# Patient Record
Sex: Female | Born: 1935
Health system: Southern US, Community
[De-identification: ages and names within clinical notes are randomized; demographics above are authoritative.]

## PROBLEM LIST (undated history)

## (undated) DIAGNOSIS — I639 Cerebral infarction, unspecified: Secondary | ICD-10-CM

## (undated) DIAGNOSIS — N289 Disorder of kidney and ureter, unspecified: Secondary | ICD-10-CM

## (undated) DIAGNOSIS — Z889 Allergy status to unspecified drugs, medicaments and biological substances status: Secondary | ICD-10-CM

## (undated) DIAGNOSIS — R011 Cardiac murmur, unspecified: Secondary | ICD-10-CM

## (undated) DIAGNOSIS — J40 Bronchitis, not specified as acute or chronic: Secondary | ICD-10-CM

## (undated) DIAGNOSIS — K219 Gastro-esophageal reflux disease without esophagitis: Secondary | ICD-10-CM

## (undated) DIAGNOSIS — Z8719 Personal history of other diseases of the digestive system: Secondary | ICD-10-CM

## (undated) DIAGNOSIS — E785 Hyperlipidemia, unspecified: Secondary | ICD-10-CM

## (undated) DIAGNOSIS — J189 Pneumonia, unspecified organism: Secondary | ICD-10-CM

## (undated) DIAGNOSIS — B029 Zoster without complications: Secondary | ICD-10-CM

## (undated) DIAGNOSIS — M810 Age-related osteoporosis without current pathological fracture: Secondary | ICD-10-CM

## (undated) DIAGNOSIS — I10 Essential (primary) hypertension: Secondary | ICD-10-CM

## (undated) DIAGNOSIS — E119 Type 2 diabetes mellitus without complications: Secondary | ICD-10-CM

## (undated) HISTORY — PX: OTHER SURGICAL HISTORY: SHX169

## (undated) HISTORY — DX: Age-related osteoporosis without current pathological fracture: M81.0

## (undated) HISTORY — DX: Hyperlipidemia, unspecified: E78.5

## (undated) HISTORY — PX: ABDOMINAL HYSTERECTOMY: SHX81

## (undated) HISTORY — PX: CATARACT EXTRACTION: SUR2

## (undated) HISTORY — PX: TONSILLECTOMY: SUR1361

---

## 1999-08-07 ENCOUNTER — Other Ambulatory Visit: Admission: RE | Admit: 1999-08-07 | Discharge: 1999-08-07 | Payer: Self-pay | Admitting: Family Medicine

## 2000-09-11 ENCOUNTER — Encounter: Payer: Self-pay | Admitting: Family Medicine

## 2000-09-11 ENCOUNTER — Encounter: Admission: RE | Admit: 2000-09-11 | Discharge: 2000-09-11 | Payer: Self-pay | Admitting: Family Medicine

## 2001-01-13 ENCOUNTER — Ambulatory Visit (HOSPITAL_COMMUNITY): Admission: RE | Admit: 2001-01-13 | Discharge: 2001-01-13 | Payer: Self-pay | Admitting: Family Medicine

## 2004-05-03 ENCOUNTER — Encounter: Admission: RE | Admit: 2004-05-03 | Discharge: 2004-05-03 | Payer: Self-pay | Admitting: Family Medicine

## 2014-03-17 ENCOUNTER — Encounter (HOSPITAL_COMMUNITY): Payer: Self-pay | Admitting: Emergency Medicine

## 2014-03-17 ENCOUNTER — Emergency Department (HOSPITAL_COMMUNITY): Payer: Medicare Other

## 2014-03-17 ENCOUNTER — Inpatient Hospital Stay (HOSPITAL_COMMUNITY)
Admission: EM | Admit: 2014-03-17 | Discharge: 2014-03-18 | DRG: 066 | Disposition: A | Discharge status: Home / Self Care | Payer: Medicare Other | Attending: Internal Medicine | Admitting: Internal Medicine

## 2014-03-17 ENCOUNTER — Inpatient Hospital Stay (HOSPITAL_COMMUNITY): Payer: Medicare Other

## 2014-03-17 DIAGNOSIS — I10 Essential (primary) hypertension: Secondary | ICD-10-CM | POA: Diagnosis present

## 2014-03-17 DIAGNOSIS — Z9119 Patient's noncompliance with other medical treatment and regimen: Secondary | ICD-10-CM

## 2014-03-17 DIAGNOSIS — E785 Hyperlipidemia, unspecified: Secondary | ICD-10-CM | POA: Diagnosis present

## 2014-03-17 DIAGNOSIS — E1159 Type 2 diabetes mellitus with other circulatory complications: Secondary | ICD-10-CM | POA: Diagnosis present

## 2014-03-17 DIAGNOSIS — I152 Hypertension secondary to endocrine disorders: Secondary | ICD-10-CM | POA: Diagnosis present

## 2014-03-17 DIAGNOSIS — I6350 Cerebral infarction due to unspecified occlusion or stenosis of unspecified cerebral artery: Principal | ICD-10-CM | POA: Diagnosis present

## 2014-03-17 DIAGNOSIS — I639 Cerebral infarction, unspecified: Secondary | ICD-10-CM | POA: Diagnosis present

## 2014-03-17 DIAGNOSIS — R209 Unspecified disturbances of skin sensation: Secondary | ICD-10-CM

## 2014-03-17 DIAGNOSIS — Z8673 Personal history of transient ischemic attack (TIA), and cerebral infarction without residual deficits: Secondary | ICD-10-CM

## 2014-03-17 DIAGNOSIS — E119 Type 2 diabetes mellitus without complications: Secondary | ICD-10-CM | POA: Diagnosis present

## 2014-03-17 DIAGNOSIS — R5381 Other malaise: Secondary | ICD-10-CM

## 2014-03-17 DIAGNOSIS — R5383 Other fatigue: Secondary | ICD-10-CM

## 2014-03-17 DIAGNOSIS — Z79899 Other long term (current) drug therapy: Secondary | ICD-10-CM

## 2014-03-17 DIAGNOSIS — Z91041 Radiographic dye allergy status: Secondary | ICD-10-CM

## 2014-03-17 DIAGNOSIS — Z91199 Patient's noncompliance with other medical treatment and regimen due to unspecified reason: Secondary | ICD-10-CM

## 2014-03-17 DIAGNOSIS — R4789 Other speech disturbances: Secondary | ICD-10-CM | POA: Diagnosis present

## 2014-03-17 HISTORY — DX: Essential (primary) hypertension: I10

## 2014-03-17 HISTORY — DX: Type 2 diabetes mellitus without complications: E11.9

## 2014-03-17 HISTORY — DX: Disorder of kidney and ureter, unspecified: N28.9

## 2014-03-17 HISTORY — DX: Cerebral infarction, unspecified: I63.9

## 2014-03-17 LAB — DIFFERENTIAL
Basophils Absolute: 0 10*3/uL (ref 0.0–0.1)
Basophils Relative: 0 % (ref 0–1)
EOS PCT: 4 % (ref 0–5)
Eosinophils Absolute: 0.3 10*3/uL (ref 0.0–0.7)
Lymphocytes Relative: 17 % (ref 12–46)
Lymphs Abs: 1.2 10*3/uL (ref 0.7–4.0)
MONOS PCT: 7 % (ref 3–12)
Monocytes Absolute: 0.5 10*3/uL (ref 0.1–1.0)
Neutro Abs: 5 10*3/uL (ref 1.7–7.7)
Neutrophils Relative %: 72 % (ref 43–77)

## 2014-03-17 LAB — I-STAT CHEM 8, ED
BUN: 19 mg/dL (ref 6–23)
CALCIUM ION: 1.13 mmol/L (ref 1.13–1.30)
CHLORIDE: 104 meq/L (ref 96–112)
Creatinine, Ser: 1 mg/dL (ref 0.50–1.10)
Glucose, Bld: 112 mg/dL — ABNORMAL HIGH (ref 70–99)
HEMATOCRIT: 42 % (ref 36.0–46.0)
Hemoglobin: 14.3 g/dL (ref 12.0–15.0)
Potassium: 4 mEq/L (ref 3.7–5.3)
Sodium: 139 mEq/L (ref 137–147)
TCO2: 22 mmol/L (ref 0–100)

## 2014-03-17 LAB — COMPREHENSIVE METABOLIC PANEL
ALBUMIN: 3.7 g/dL (ref 3.5–5.2)
ALT: 9 U/L (ref 0–35)
AST: 12 U/L (ref 0–37)
Alkaline Phosphatase: 96 U/L (ref 39–117)
BUN: 19 mg/dL (ref 6–23)
CALCIUM: 9.8 mg/dL (ref 8.4–10.5)
CHLORIDE: 100 meq/L (ref 96–112)
CO2: 26 mEq/L (ref 19–32)
CREATININE: 1.04 mg/dL (ref 0.50–1.10)
GFR calc Af Amer: 59 mL/min — ABNORMAL LOW (ref >90)
GFR calc non Af Amer: 51 mL/min — ABNORMAL LOW (ref >90)
Glucose, Bld: 110 mg/dL — ABNORMAL HIGH (ref 70–99)
Potassium: 4.4 mEq/L (ref 3.7–5.3)
Sodium: 142 mEq/L (ref 137–147)
Total Protein: 7.6 g/dL (ref 6.0–8.3)

## 2014-03-17 LAB — CBC
HCT: 39.6 % (ref 36.0–46.0)
HEMOGLOBIN: 12.8 g/dL (ref 12.0–15.0)
MCH: 28.4 pg (ref 26.0–34.0)
MCHC: 32.3 g/dL (ref 30.0–36.0)
MCV: 87.8 fL (ref 78.0–100.0)
Platelets: 280 10*3/uL (ref 150–400)
RBC: 4.51 MIL/uL (ref 3.87–5.11)
RDW: 13.3 % (ref 11.5–15.5)
WBC: 7 10*3/uL (ref 4.0–10.5)

## 2014-03-17 LAB — ETHANOL: Alcohol, Ethyl (B): <11 mg/dL (ref 0–11)

## 2014-03-17 LAB — URINALYSIS, ROUTINE W REFLEX MICROSCOPIC
BILIRUBIN URINE: NEGATIVE
Glucose, UA: NEGATIVE mg/dL
HGB URINE DIPSTICK: NEGATIVE
Ketones, ur: NEGATIVE mg/dL
Nitrite: NEGATIVE
Protein, ur: NEGATIVE mg/dL
SPECIFIC GRAVITY, URINE: 1.008 (ref 1.005–1.030)
UROBILINOGEN UA: 0.2 mg/dL (ref 0.0–1.0)
pH: 6 (ref 5.0–8.0)

## 2014-03-17 LAB — GLUCOSE, CAPILLARY: GLUCOSE-CAPILLARY: 112 mg/dL — AB (ref 70–99)

## 2014-03-17 LAB — I-STAT TROPONIN, ED: Troponin i, poc: 0 ng/mL (ref 0.00–0.08)

## 2014-03-17 LAB — URINE MICROSCOPIC-ADD ON

## 2014-03-17 LAB — RAPID URINE DRUG SCREEN, HOSP PERFORMED
Amphetamines: NOT DETECTED
Barbiturates: NOT DETECTED
Benzodiazepines: NOT DETECTED
COCAINE: NOT DETECTED
OPIATES: NOT DETECTED
TETRAHYDROCANNABINOL: NOT DETECTED

## 2014-03-17 LAB — PROTIME-INR
INR: 0.99 (ref 0.00–1.49)
Prothrombin Time: 12.9 seconds (ref 11.6–15.2)

## 2014-03-17 LAB — APTT: aPTT: 28 seconds (ref 24–37)

## 2014-03-17 MED ORDER — LORATADINE 10 MG PO TABS
10.0000 mg | ORAL_TABLET | Freq: Every day | ORAL | Status: DC
  Administered 2014-03-18: 10 mg via ORAL
  Filled 2014-03-17: qty 1

## 2014-03-17 MED ORDER — SODIUM CHLORIDE 0.9 % IJ SOLN
3.0000 mL | Freq: Two times a day (BID) | INTRAMUSCULAR | Status: DC
  Administered 2014-03-17 – 2014-03-18 (×2): 3 mL via INTRAVENOUS

## 2014-03-17 MED ORDER — INSULIN ASPART 100 UNIT/ML ~~LOC~~ SOLN
0.0000 [IU] | Freq: Three times a day (TID) | SUBCUTANEOUS | Status: DC
  Administered 2014-03-18: 1 [IU] via SUBCUTANEOUS

## 2014-03-17 MED ORDER — ACETAMINOPHEN 325 MG PO TABS
650.0000 mg | ORAL_TABLET | Freq: Four times a day (QID) | ORAL | Status: DC | PRN
  Administered 2014-03-17: 650 mg via ORAL
  Filled 2014-03-17: qty 2

## 2014-03-17 MED ORDER — SODIUM CHLORIDE 0.9 % IV SOLN: 250.0000 mL | INTRAVENOUS | Status: DC | PRN

## 2014-03-17 MED ORDER — SODIUM CHLORIDE 0.9 % IJ SOLN: 3.0000 mL | INTRAMUSCULAR | Status: DC | PRN

## 2014-03-17 MED ORDER — SENNOSIDES-DOCUSATE SODIUM 8.6-50 MG PO TABS: 1.0000 | ORAL_TABLET | Freq: Every evening | ORAL | Status: DC | PRN

## 2014-03-17 MED ORDER — ENOXAPARIN SODIUM 40 MG/0.4ML ~~LOC~~ SOLN
40.0000 mg | SUBCUTANEOUS | Status: DC
  Administered 2014-03-17: 40 mg via SUBCUTANEOUS
  Filled 2014-03-17 (×2): qty 0.4

## 2014-03-17 MED ORDER — ATORVASTATIN CALCIUM 40 MG PO TABS
40.0000 mg | ORAL_TABLET | Freq: Every day | ORAL | Status: DC
  Filled 2014-03-17: qty 1

## 2014-03-17 MED ORDER — GLIMEPIRIDE 1 MG PO TABS
1.0000 mg | ORAL_TABLET | Freq: Every day | ORAL | Status: DC
  Filled 2014-03-17: qty 1

## 2014-03-17 MED ORDER — CARVEDILOL 25 MG PO TABS
25.0000 mg | ORAL_TABLET | Freq: Two times a day (BID) | ORAL | Status: DC
  Filled 2014-03-17: qty 1

## 2014-03-17 NOTE — ED Notes (Signed)
MD at bedside. 

## 2014-03-17 NOTE — Progress Notes (Signed)
Report taken by second RN; pt arrived to the unit at 1800 with family and belongings at side. Pt oriented to the unit and room; denies any pain, placed on telemetry, IV saline locked intact, call light with reach, pt dinner meal delivered and pt has eaten; family remains at pt side. Will report off to incoming RN. P. Amo Minsa Weddington.

## 2014-03-17 NOTE — H&P (Signed)
Date: 03/17/2014               Patient Name:  Shirley Sanders MRN: [id number]  DOB: Jul 30, 1935 Age / Sex: 79 y.o., female   PCP: Leonides Sake, MD         Medical Service: Internal Medicine Teaching Service         Attending Physician: Dr. Dareen Piano    First Contact: Dr. Stann Mainland Pager: [phone]  Second Contact: Dr. Alice Rieger Pager: [phone]       After Hours (After 5p/  First Contact Pager: [phone]  weekends / holidays): Second Contact Pager: [phone]   Chief Complaint: lip and right arm numbness/tingling  History of Present Illness:  Ms. Perryman is a 79 year old woman with history of stroke in 2003, TIA x 2, DM2, HTN who presents with perioral and right arm numbness/tingling, slurred speech.    Patient states she was at work this morning around 10:30am when she felt that her lips were tingling and swelling.  She asked a coworker if her lips looked swollen, and he said no but that her speech was slurred.  She then had another coworker take her to her PCP's office where they gave her an aspirin and called 911.  Symptoms have been improving throughout the day, and patient now feels that her speech is at baseline but can "still feel it a little" in her right arm.  She has chronic residual numbness and decreased strength in her left arm and leg since previous stroke in 2003.  She is compliant with most of her medications but only takes ASA intermittently.  No fever, chest pain, shortness of breath, abdominal pain, dizziness, LOC.   In ED, CT head negative for acute abnormality. Neurology was consulted and recommended full stroke workup; IMTS subsequently called for admission.   Meds: No current facility-administered medications for this encounter.   Current Outpatient Prescriptions  Medication Sig Dispense Refill  . cetirizine (ZYRTEC) 10 MG tablet Take 10 mg by mouth daily.      Marland Kitchen glimepiride (AMARYL) 2 MG tablet Take 2 mg by mouth daily with breakfast.      . hydrochlorothiazide  (HYDRODIURIL) 12.5 MG tablet Take 12.5 mg by mouth daily.      . hydrochlorothiazide (HYDRODIURIL) 25 MG tablet Take 12.5 mg by mouth daily.      Marland Kitchen lisinopril (PRINIVIL,ZESTRIL) 40 MG tablet Take 40 mg by mouth daily.      . metFORMIN (GLUCOPHAGE) 500 MG tablet Take 500 mg by mouth 2 (two) times daily with a meal.      . simvastatin (ZOCOR) 80 MG tablet Take 80 mg by mouth daily.      . carvedilol (COREG) 25 MG tablet Take 25 mg by mouth 2 (two) times daily.        Allergies: Allergies as of 03/17/2014 - Review Complete 03/17/2014  Allergen Reaction Noted  . Contrast media [iodinated diagnostic agents]  03/17/2014   Past Medical History  Diagnosis Date  . Hypertension   . Diabetes mellitus without complication   . Renal disorder   . Stroke     left sided sensory and motor deficits   Past Surgical History  Procedure Laterality Date  . Tonsillectomy    . Abdominal hysterectomy     Family History  Problem Relation Age of Onset  . Hypertension Mother   . Hypertension Father    History   Social History  . Marital Status: Divorced    Spouse Name: N/A  Number of Children: N/A  . Years of Education: N/A   Occupational History  . Not on file.   Social History Main Topics  . Smoking status: Never Smoker   . Smokeless tobacco: Never Used  . Alcohol Use: No  . Drug Use: Not on file  . Sexual Activity: Not on file   Other Topics Concern  . Not on file   Social History Narrative  . No narrative on file  No smoking, EtOH, illicits.   Review of Systems: Review of Systems  Constitutional: Negative for fever.  Eyes: Negative for blurred vision.  Respiratory: Negative for cough and shortness of breath.   Cardiovascular: Negative for chest pain and leg swelling.  Gastrointestinal: Negative for nausea, vomiting, abdominal pain, diarrhea and constipation.  Genitourinary: Negative for dysuria.  Musculoskeletal: Negative for falls and myalgias.  Neurological: Positive for  tingling, speech change and focal weakness. Negative for dizziness, loss of consciousness and headaches.    Physical Exam: Blood pressure 178/87, pulse 77, temperature 98.9 F (37.2 C), temperature source Oral, resp. rate 15, height 5\' 1"  (1.549 m), weight 140 lb (63.504 kg), SpO2 97.00%. General: alert, cooperative, and in no apparent distress HEENT: NCAT, vision grossly intact, oropharynx clear and non-erythematous  Neck: supple, no lymphadenopathy Lungs: clear to ascultation bilaterally, normal work of respiration, no wheezes, rales, ronchi Heart: regular rate and rhythm, no murmurs, gallops, or rubs Abdomen: soft, non-tender, non-distended, normal bowel sounds Extremities: 2+ DP/PT pulses bilaterally, no cyanosis, clubbing, or edema Neurologic: alert & oriented X3, cranial nerves II-XII intact, 4/5 strength in right upper extremity, 4+/5 strength in all other extremities, sensation intact throughout, normal FTN, normal gait   Lab results: Basic Metabolic Panel:  Recent Labs  03/17/14 1243 03/17/14 1253  NA 142 139  K 4.4 4.0  CL 100 104  CO2 26  --   GLUCOSE 110* 112*  BUN 19 19  CREATININE 1.04 1.00  CALCIUM 9.8  --    Liver Function Tests:  Recent Labs  03/17/14 1243  AST 12  ALT 9  ALKPHOS 96  BILITOT <0.2*  PROT 7.6  ALBUMIN 3.7   CBC:  Recent Labs  03/17/14 1243 03/17/14 1253  WBC 7.0  --   NEUTROABS 5.0  --   HGB 12.8 14.3  HCT 39.6 42.0  MCV 87.8  --   PLT 280  --    Coagulation:  Recent Labs  03/17/14 1243  LABPROT 12.9  INR 0.99   Urine Drug Screen: Drugs of Abuse     Component Value Date/Time   LABOPIA NONE DETECTED 03/17/2014 1319   COCAINSCRNUR NONE DETECTED 03/17/2014 1319   LABBENZ NONE DETECTED 03/17/2014 1319   AMPHETMU NONE DETECTED 03/17/2014 1319   THCU NONE DETECTED 03/17/2014 1319   LABBARB NONE DETECTED 03/17/2014 1319    Alcohol Level:  Recent Labs  03/17/14 1243  ETH <11   Urinalysis:  Recent Labs   03/17/14 1319  COLORURINE STRAW*  LABSPEC 1.008  PHURINE 6.0  GLUCOSEU NEGATIVE  HGBUR NEGATIVE  BILIRUBINUR NEGATIVE  KETONESUR NEGATIVE  PROTEINUR NEGATIVE  UROBILINOGEN 0.2  NITRITE NEGATIVE  LEUKOCYTESUR SMALL*    Imaging results:  Ct Head Wo Contrast  03/17/2014   CLINICAL DATA:  Code stroke.  Right face and arm numbness  EXAM: CT HEAD WITHOUT CONTRAST  TECHNIQUE: Contiguous axial images were obtained from the base of the skull through the vertex without intravenous contrast.  COMPARISON:  None.  FINDINGS: Age-appropriate atrophy. Moderate chronic microvascular ischemic  changes in the white matter. Chronic infarct in the right putamen and deep white matter.  Negative for acute infarct.  Negative for hemorrhage or mass.  Calvarium intact.  IMPRESSION: Atrophy and moderate chronic microvascular ischemic changes. No acute abnormality.  Critical Value/emergent results were called by telephone at the time of interpretation on 03/17/2014 at 1:03 PM to Dr. Leonel Ramsay , who verbally acknowledged these results.   Electronically Signed   By: Franchot Gallo M.D.   On: 03/17/2014 13:03   Mr Brain Wo Contrast  03/17/2014   CLINICAL DATA:  History of stroke with left arm and leg numbness. Sudden onset of right arm numbness and right arm weakness.  EXAM: MRI HEAD WITHOUT CONTRAST  TECHNIQUE: Multiplanar, multiecho pulse sequences of the brain and surrounding structures were obtained without intravenous contrast.  COMPARISON:  Head CT same day  FINDINGS: There is a punctate acute infarction along the surface of a right frontal gyrus at the vertex. This could go along with the patient's presentation. No evidence of swelling or hemorrhage.  Brainstem and cerebellum do not show any focal insult. The cerebral hemispheres show moderate chronic small vessel changes throughout the white matter an old infarction affecting the right posterior basal ganglia, posterior limb internal capsule and corona radiata. No mass  lesion, hydrocephalus or extra-axial collection. No pituitary mass. No inflammatory sinus disease. No skull or skullbase lesion. Major vessels at the base of the brain show flow.  IMPRESSION: Punctate acute infarction affecting a right posterior frontal gyrus at the vertex.  Chronic ischemic changes elsewhere throughout the brain as outlined above.   Electronically Signed   By: Nelson Chimes M.D.   On: 03/17/2014 14:32    Assessment & Plan by Problem: #Acute stroke- Patient presented with perioral and right arm numbness/tingling; last normal at 10am. CT head negative for acute abnormality but with atrophy and moderate chronic ischemic changes.  MRI showed punctate acute infarction affecting right posterior frontal gyrus (though majority of patient's symptoms on right side). tPA was not given due to minimal and resolving symptoms. Stroke risk factors include prior CVA, DM2, HTN, HL.  Patient is also not compliant with daily ASA.  Troponin x 1 negative. Patient passed bedside swallow in ED.  -admit to telemetry -neurology consult, appreciate recs -ASA daily, statin  -echo, carotid dopplers -A1C, lipid profile in AM -PT/OT evals -carb mod diet -frequent neuro checks  #HTN- BP elevated at 178/87, 157/89 during exam.  On Coreg 12.5 mg BID, HCTZ 25 mg daily, lisinopril 40 mg daily at home.  -holding home BP meds for permissive HTN  #DM2- No A1C on record as PCP is in [place], [place].  On metformin 500 mg BID and glimepiride 2 mg daily at home.  -CBGs AC -SSI-sensitive  #DVT PPX- lovenox  #Code status- Full code  Dispo: Disposition is deferred at this time, awaiting improvement of current medical problems. Anticipated discharge in approximately 1-2 day(s).   The patient does have a current PCP (Leonides Sake, MD) and does need an [facility] hospital follow-up appointment after discharge.   Signed: Ivin Poot, MD 03/17/2014, 2:41 PM

## 2014-03-17 NOTE — Consult Note (Signed)
Referring Physician: Mcmanus    Chief Complaint: Code stroke  HPI:                                                                                                                                         Shirley Sanders is an 79 y.o. female with previous CVA leaving her with residual left arm and leg decreased sensation.  Patient was at her baseline this morning when she had sudden onset lip and right arm numbness and right arm weakness at 1030. She went to her PCP where they gave her ASA and called EMS. On arrival patient continued to have the right arm decreased sensation and mild weakness. Initial CT head was negative.  It was discussed with patient her symptoms were mild however she likely is suffering a CVA. tPA was not given due to minimal symptoms. She admits to not taking her ASA daily.    Date last known well: Date: 03/17/2014 Time last known well: Time: 10:30 tPA Given: No: mild symptoms NIHSS 7  Past Medical History  Diagnosis Date  . Hypertension   . Diabetes mellitus without complication   . Renal disorder   . Stroke     left sided sensory and motor deficits    Past Surgical History  Procedure Laterality Date  . Tonsillectomy    . Abdominal hysterectomy      Family History  Problem Relation Age of Onset  . Hypertension Mother   . Hypertension Father    Social History:  reports that she has never smoked. She has never used smokeless tobacco. She reports that she does not drink alcohol. Her drug history is not on file.  Allergies:  Allergies  Allergen Reactions  . Contrast Media [Iodinated Diagnostic Agents]     Medications:                                                                                                                           No current facility-administered medications for this encounter.   Current Outpatient Prescriptions  Medication Sig Dispense Refill  . cetirizine (ZYRTEC) 10 MG tablet Take 10 mg by mouth daily.      Marland Kitchen glimepiride  (AMARYL) 2 MG tablet Take 2 mg by mouth daily with breakfast.      . hydrochlorothiazide (HYDRODIURIL) 12.5 MG tablet Take 12.5 mg by mouth daily.      Marland Kitchen  hydrochlorothiazide (HYDRODIURIL) 25 MG tablet Take 12.5 mg by mouth daily.      Marland Kitchen lisinopril (PRINIVIL,ZESTRIL) 40 MG tablet Take 40 mg by mouth daily.      . metFORMIN (GLUCOPHAGE) 500 MG tablet Take 500 mg by mouth 2 (two) times daily with a meal.      . simvastatin (ZOCOR) 80 MG tablet Take 80 mg by mouth daily.      . carvedilol (COREG) 25 MG tablet Take 25 mg by mouth 2 (two) times daily.         ROS:                                                                                                                                       History obtained from the patient  General ROS: negative for - chills, fatigue, fever, night sweats, weight gain or weight loss Psychological ROS: negative for - behavioral disorder, hallucinations, memory difficulties, mood swings or suicidal ideation Ophthalmic ROS: negative for - blurry vision, double vision, eye pain or loss of vision ENT ROS: negative for - epistaxis, nasal discharge, oral lesions, sore throat, tinnitus or vertigo Allergy and Immunology ROS: negative for - hives or itchy/watery eyes Hematological and Lymphatic ROS: negative for - bleeding problems, bruising or swollen lymph nodes Endocrine ROS: negative for - galactorrhea, hair pattern changes, polydipsia/polyuria or temperature intolerance Respiratory ROS: negative for - cough, hemoptysis, shortness of breath or wheezing Cardiovascular ROS: negative for - chest pain, dyspnea on exertion, edema or irregular heartbeat Gastrointestinal ROS: negative for - abdominal pain, diarrhea, hematemesis, nausea/vomiting or stool incontinence Genito-Urinary ROS: negative for - dysuria, hematuria, incontinence or urinary frequency/urgency Musculoskeletal ROS: negative for - joint swelling or muscular weakness Neurological ROS: as noted in  HPI Dermatological ROS: negative for rash and skin lesion changes  Neurologic Examination:                                                                                                      Blood pressure 181/78, pulse 83, temperature 98.9 F (37.2 C), temperature source Oral, resp. rate 15, height 5\' 1"  (1.549 m), weight 63.504 kg (140 lb), SpO2 99.00%.   Mental Status: Alert, oriented, thought content appropriate.  Speech fluent without evidence of aphasia.  Able to follow 3 step commands without difficulty. Cranial Nerves: II: Discs flat bilaterally; Visual fields grossly normal, pupils equal, round, reactive to light and accommodation III,IV, VI: ptosis not present, extra-ocular motions intact bilaterally V,VII: smile asymmetric on left (  old), facial light touch sensation normal bilaterally VIII: hearing normal bilaterally IX,X: gag reflex present XI: bilateral shoulder shrug XII: midline tongue extension without atrophy or fasciculations  Motor: Right : Upper extremity   4/5    Left:     Upper extremity   4+/5  Lower extremity   4/5     Lower extremity   4+/5 Drift noted in all 4 extremities (the left is residual from previous CVA) Tone increased on left, no atrophy noted Sensory: Pinprick and light touch decreased on the left >right but the right is new Deep Tendon Reflexes:  Right: Upper Extremity   Left: Upper extremity   biceps (C-5 to C-6) 2/4   biceps (C-5 to C-6) 2/4 tricep (C7) 2/4    triceps (C7) 2/4 Brachioradialis (C6) 2/4  Brachioradialis (C6) 2/4  Lower Extremity Lower Extremity  quadriceps (L-2 to L-4) 2/4   quadriceps (L-2 to L-4) 2/4 Achilles (S1) 1/4   Achilles (S1) 1/4  Plantars: Right: downgoing   Left: downgoing Cerebellar: normal finger-to-nose,  Difficulty with heel-to-shin test Gait: was normal CV: pulses palpable throughout    Lab Results: Basic Metabolic Panel:  Recent Labs Lab 03/17/14 1253  NA 139  K 4.0  CL 104  GLUCOSE 112*   BUN 19  CREATININE 1.00    Liver Function Tests: No results found for this basename: AST, ALT, ALKPHOS, BILITOT, PROT, ALBUMIN,  in the last 168 hours No results found for this basename: LIPASE, AMYLASE,  in the last 168 hours No results found for this basename: AMMONIA,  in the last 168 hours  CBC:  Recent Labs Lab 03/17/14 1243 03/17/14 1253  WBC 7.0  --   NEUTROABS 5.0  --   HGB 12.8 14.3  HCT 39.6 42.0  MCV 87.8  --   PLT 280  --     Cardiac Enzymes: No results found for this basename: CKTOTAL, CKMB, CKMBINDEX, TROPONINI,  in the last 168 hours  Lipid Panel: No results found for this basename: CHOL, TRIG, HDL, CHOLHDL, VLDL, LDLCALC,  in the last 168 hours  CBG: No results found for this basename: GLUCAP,  in the last 168 hours  Microbiology: No results found for this or any previous visit.  Coagulation Studies:  Recent Labs  03/17/14 1243  LABPROT 12.9  INR 0.99    Imaging: Ct Head Wo Contrast  03/17/2014   CLINICAL DATA:  Code stroke.  Right face and arm numbness  EXAM: CT HEAD WITHOUT CONTRAST  TECHNIQUE: Contiguous axial images were obtained from the base of the skull through the vertex without intravenous contrast.  COMPARISON:  None.  FINDINGS: Age-appropriate atrophy. Moderate chronic microvascular ischemic changes in the white matter. Chronic infarct in the right putamen and deep white matter.  Negative for acute infarct.  Negative for hemorrhage or mass.  Calvarium intact.  IMPRESSION: Atrophy and moderate chronic microvascular ischemic changes. No acute abnormality.  Critical Value/emergent results were called by telephone at the time of interpretation on 03/17/2014 at 1:03 PM to Dr. Leonel Ramsay , who verbally acknowledged these results.   Electronically Signed   By: Franchot Gallo M.D.   On: 03/17/2014 13:03       Assessment and plan discussed with with attending physician and they are in agreement.    Etta Quill PA-C [facility] [phone]  03/17/2014, 1:29 PM  I have seen and evaluated the patient. I have reviewed the above note and made appropriate changes.     Assessment: 78  y.o. female presenting with new onset right arm weakness and decreased sensation. Patient likely suffering from left subcortical CVA. She was not a tPA due to minimal symptoms.   Stroke Risk Factors - diabetes mellitus and hypertension  1. HgbA1c, fasting lipid panel 2. MRI, MRA  of the brain without contrast 3. PT consult, OT consult,  4. Echocardiogram 5. Carotid dopplers 6. Prophylactic therapy-Antiplatelet med: Aspirin - dose 81 mg daily 7. Risk factor modification 8. Telemetry monitoring 9. Frequent neuro checks  Roland Rack, MD [facility] [phone]  If 7pm- 7am, please page neurology on call as listed in [place].

## 2014-03-17 NOTE — Progress Notes (Signed)
Patient admitted to unit from ER via stretcher.  Patient able to transfer to bed ambulating with one standby assist with steady gait.  Oriented patient to room and made comfortable in bed.  Applied monitor and called central monitor to assign patient monitor.

## 2014-03-17 NOTE — ED Notes (Signed)
Pt remains in MRI 

## 2014-03-17 NOTE — ED Provider Notes (Signed)
CSN: [id number]     Arrival date & time 03/17/14  1158 History   First MD Initiated Contact with Patient 03/17/14 1212     Chief Complaint  Patient presents with  . Numbness    Lips and R arm      HPI Pt was seen at 1210. Per pt and her family, c/o sudden onset and gradual improvement in constant right lips "numbness," right sided facial droop, slurred speech, right forearm "numbness," as well as RUE and RLE "heaviness" that began approximately 1030 PTA. Pt states she was sitting at her desk at work when she noticed "my lips felt like they were swelling up." Pt states she asked a co-worker if they were and she was told they were not, but the right side of her face was "drooping." Pt's family spoke with her on the phone at 1100 and noted pt's "speech to be slurred." Pt states she then felt her RUE (all fingers, hand, and forearm) become "numb" and "tingling." Pt states these symptoms reminded her of her previous CVA and her current left sided deficits. Pt went to her PMD's office where they gave her an ASA and called EMS. EMS stated pt's "stroke screen was negative." Pt's family has arrived to the ED with pt and feels her speech is "back to normal."  Pt continues to feel her right lips and right arm "feel different," as well as RUE and RLE "feel heavy" when she moves them. Denies CP/palpitations, no SOB/cough, no abd pain, no N/V/D, no back pain, no syncope/near syncope.    Past Medical History  Diagnosis Date  . Hypertension   . Diabetes mellitus without complication   . Renal disorder   . Stroke     left sided sensory and motor deficits   Past Surgical History  Procedure Laterality Date  . Tonsillectomy    . Abdominal hysterectomy      History  Substance Use Topics  . Smoking status: Never Smoker   . Smokeless tobacco: Never Used  . Alcohol Use: No    Review of Systems ROS: Statement: All systems negative except as marked or noted in the HPI; Constitutional: Negative for fever  and chills. ; ; Eyes: Negative for eye pain, redness and discharge. ; ; ENMT: Negative for ear pain, hoarseness, nasal congestion, sinus pressure and sore throat. ; ; Cardiovascular: Negative for chest pain, palpitations, diaphoresis, dyspnea and peripheral edema. ; ; Respiratory: Negative for cough, wheezing and stridor. ; ; Gastrointestinal: Negative for nausea, vomiting, diarrhea, abdominal pain, blood in stool, hematemesis, jaundice and rectal bleeding.; ; Genitourinary: Negative for dysuria, flank pain and hematuria. ; ; Musculoskeletal: Negative for back pain and neck pain. Negative for swelling and trauma.; ; Skin: Negative for pruritus, rash, abrasions, blisters, bruising and skin lesion.; ; Neuro: Negative for headache, lightheadedness and neck stiffness. Negative for altered level of consciousness , altered mental status, seizure, syncope. +right facial droop, right lips numbness, slurred speech, extremity weakness, paresthesias, "heavy" RUE and RLE.    Allergies  Contrast media  Home Medications   Prior to Admission medications   Medication Sig Start Date End Date Taking? Authorizing Provider  carvedilol (COREG) 25 MG tablet Take 25 mg by mouth 2 (two) times daily. 03/15/14   Historical Provider, MD   BP 204/107  Pulse 83  Temp(Src) 98.9 F (37.2 C) (Oral)  Resp 18  SpO2 97% Physical Exam 1215: Physical examination:  Nursing notes reviewed; Vital signs and O2 SAT reviewed;  Constitutional: Well developed,  Well nourished, Well hydrated, In no acute distress; Head:  Normocephalic, atraumatic; Eyes: EOMI, PERRL, No scleral icterus; ENMT: Mouth and pharynx normal, Mucous membranes moist; Neck: Supple, Full range of motion, No lymphadenopathy; Cardiovascular: Regular rate and rhythm, No gallop; Respiratory: Breath sounds clear & equal bilaterally, No rales, rhonchi, wheezes.  Speaking full sentences with ease, Normal respiratory effort/excursion; Chest: Nontender, Movement normal; Abdomen:  Soft, Nontender, Nondistended, Normal bowel sounds; Genitourinary: No CVA tenderness; Extremities: Pulses normal, No tenderness, No edema, No calf edema or asymmetry.; Neuro: AA&Ox3, Major CN grossly intact. Speech clear.  +mild left lower facial droop per baseline.  No nystagmus. Left grip weaker than right per baseline. Strength 4/5 equal bilat UE's and LE's.  DTR 2/4 equal bilat UE's and LE's. +baseline left lower face, LUE, LLE sensory deficits unchanged. +right lower face, RUE decreased sensation. Unable to perform cerebellar testing bilat UE's (finger-nose) and LE's (heel-shin) due to c/o "right arm and leg feel heavy" and previous CVA left sided deficits.; Skin: Color normal, Warm, Dry.   ED Course  Procedures    1210:  NIH 3. Code stroke activated. Pt not TPA candidate due to rapidly improving deficits.  1250:  Neuro Dr. Leonel Ramsay has evaluated pt: agrees pt has NIH 3 (new deficits only)/NIH 7 (including old deficits), is not TPA candidate at this time, but likely had CVA; he requests to admit pt to medicine service and Neuro service will consult.   1415:  No change in neuro status. VS remain stable. MRI brain completed. Dx and testing d/w pt and family.  Questions answered.  Verb understanding, agreeable to admit.  T/C to [facility] Resident, case discussed, including:  HPI, pertinent PM/SHx, VS/PE, dx testing, ED course and treatment:  Agreeable to admit, requests they will come to the ED for evaluation.       EKG Interpretation   Date/Time:  Wednesday March 17 2014 12:02:14 EDT Ventricular Rate:  79 PR Interval:  182 QRS Duration: 83 QT Interval:  373 QTC Calculation: 428 R Axis:   33 Text Interpretation:  Sinus rhythm Abnormal R-wave progression, early  transition Baseline wander No old tracing to compare Confirmed by [facility]   MD, Nunzio Cory [phone]) on 03/17/2014 12:33:03 PM      MDM  MDM Reviewed: previous chart, nursing note and vitals Reviewed previous: labs and  ECG Interpretation: labs, ECG, x-ray and CT scan Total time providing critical care: 30-74 minutes. This excludes time spent performing separately reportable procedures and services.   CRITICAL CARE Performed by: Alfonzo Feller Total critical care time: 35 Critical care time was exclusive of separately billable procedures and treating other patients. Critical care was necessary to treat or prevent imminent or life-threatening deterioration. Critical care was time spent personally by me on the following activities: development of treatment plan with patient and/or surrogate as well as nursing, discussions with consultants, evaluation of patient's response to treatment, examination of patient, obtaining history from patient or surrogate, ordering and performing treatments and interventions, ordering and review of laboratory studies, ordering and review of radiographic studies, pulse oximetry and re-evaluation of patient's condition.    Results for orders placed during the hospital encounter of 03/17/14  ETHANOL      Result Value Ref Range   Alcohol, Ethyl (B) <11  0 - 11 mg/dL  PROTIME-INR      Result Value Ref Range   Prothrombin Time 12.9  11.6 - 15.2 seconds   INR 0.99  0.00 - 1.49  APTT  Result Value Ref Range   aPTT 28  24 - 37 seconds  CBC      Result Value Ref Range   WBC 7.0  4.0 - 10.5 K/uL   RBC 4.51  3.87 - 5.11 MIL/uL   Hemoglobin 12.8  12.0 - 15.0 g/dL   HCT 39.6  36.0 - 46.0 %   MCV 87.8  78.0 - 100.0 fL   MCH 28.4  26.0 - 34.0 pg   MCHC 32.3  30.0 - 36.0 g/dL   RDW 13.3  11.5 - 15.5 %   Platelets 280  150 - 400 K/uL  DIFFERENTIAL      Result Value Ref Range   Neutrophils Relative % 72  43 - 77 %   Neutro Abs 5.0  1.7 - 7.7 K/uL   Lymphocytes Relative 17  12 - 46 %   Lymphs Abs 1.2  0.7 - 4.0 K/uL   Monocytes Relative 7  3 - 12 %   Monocytes Absolute 0.5  0.1 - 1.0 K/uL   Eosinophils Relative 4  0 - 5 %   Eosinophils Absolute 0.3  0.0 - 0.7 K/uL    Basophils Relative 0  0 - 1 %   Basophils Absolute 0.0  0.0 - 0.1 K/uL  COMPREHENSIVE METABOLIC PANEL      Result Value Ref Range   Sodium 142  137 - 147 mEq/L   Potassium 4.4  3.7 - 5.3 mEq/L   Chloride 100  96 - 112 mEq/L   CO2 26  19 - 32 mEq/L   Glucose, Bld 110 (*) 70 - 99 mg/dL   BUN 19  6 - 23 mg/dL   Creatinine, Ser 1.04  0.50 - 1.10 mg/dL   Calcium 9.8  8.4 - 10.5 mg/dL   Total Protein 7.6  6.0 - 8.3 g/dL   Albumin 3.7  3.5 - 5.2 g/dL   AST 12  0 - 37 U/L   ALT 9  0 - 35 U/L   Alkaline Phosphatase 96  39 - 117 U/L   Total Bilirubin <0.2 (*) 0.3 - 1.2 mg/dL   GFR calc non Af Amer 51 (*) >90 mL/min   GFR calc Af Amer 59 (*) >90 mL/min  URINALYSIS, ROUTINE W REFLEX MICROSCOPIC      Result Value Ref Range   Color, Urine STRAW (*) YELLOW   APPearance CLEAR  CLEAR   Specific Gravity, Urine 1.008  1.005 - 1.030   pH 6.0  5.0 - 8.0   Glucose, UA NEGATIVE  NEGATIVE mg/dL   Hgb urine dipstick NEGATIVE  NEGATIVE   Bilirubin Urine NEGATIVE  NEGATIVE   Ketones, ur NEGATIVE  NEGATIVE mg/dL   Protein, ur NEGATIVE  NEGATIVE mg/dL   Urobilinogen, UA 0.2  0.0 - 1.0 mg/dL   Nitrite NEGATIVE  NEGATIVE   Leukocytes, UA SMALL (*) NEGATIVE  URINE MICROSCOPIC-ADD ON      Result Value Ref Range   Squamous Epithelial / LPF RARE  RARE   WBC, UA 7-10  <3 WBC/hpf   RBC / HPF 0-2  <3 RBC/hpf   Bacteria, UA FEW (*) RARE  I-STAT CHEM 8, ED      Result Value Ref Range   Sodium 139  137 - 147 mEq/L   Potassium 4.0  3.7 - 5.3 mEq/L   Chloride 104  96 - 112 mEq/L   BUN 19  6 - 23 mg/dL   Creatinine, Ser 1.00  0.50 - 1.10 mg/dL   Glucose,  Bld 112 (*) 70 - 99 mg/dL   Calcium, Ion 1.13  1.13 - 1.30 mmol/L   TCO2 22  0 - 100 mmol/L   Hemoglobin 14.3  12.0 - 15.0 g/dL   HCT 42.0  36.0 - 46.0 %  I-STAT TROPOININ, ED      Result Value Ref Range   Troponin i, poc 0.00  0.00 - 0.08 ng/mL   Comment 3            Ct Head Wo Contrast 03/17/2014   CLINICAL DATA:  Code stroke.  Right face and arm  numbness  EXAM: CT HEAD WITHOUT CONTRAST  TECHNIQUE: Contiguous axial images were obtained from the base of the skull through the vertex without intravenous contrast.  COMPARISON:  None.  FINDINGS: Age-appropriate atrophy. Moderate chronic microvascular ischemic changes in the white matter. Chronic infarct in the right putamen and deep white matter.  Negative for acute infarct.  Negative for hemorrhage or mass.  Calvarium intact.  IMPRESSION: Atrophy and moderate chronic microvascular ischemic changes. No acute abnormality.  Critical Value/emergent results were called by telephone at the time of interpretation on 03/17/2014 at 1:03 PM to Dr. Leonel Ramsay , who verbally acknowledged these results.   Electronically Signed   By: Franchot Gallo M.D.   On: 03/17/2014 13:03       Alfonzo Feller, DO 03/19/14 1647

## 2014-03-17 NOTE — ED Notes (Signed)
Pt to MRI

## 2014-03-17 NOTE — ED Notes (Signed)
Pt returned from MRI °

## 2014-03-17 NOTE — ED Notes (Addendum)
Pt arrived by [facility] and they report pt went to PCP today for Lip numbness and R upper arm numbness that started at 1030 today. They report PCP gave pt 325mg  ASA and advised pt to call 911. EMS reports pt has had previous stroke w/ L sided weakness as an existing deficit. EMS reports Stroke screening was negative. Pt verbalizes her Lips and R upper arm are still numb. Pt is reports Hx of HTN and verbalizes she has taken her Rx medications today as scheduled

## 2014-03-17 NOTE — ED Notes (Signed)
ADMITTING DOCTORS AT BEDSIDE

## 2014-03-17 NOTE — ED Notes (Signed)
PAGED ADMITTING DOCTOR.

## 2014-03-17 NOTE — ED Notes (Signed)
Patient placed on bedpan with family at bedside and call button in reach.

## 2014-03-17 NOTE — ED Notes (Signed)
ATTEMPTED TO CALL REPORT

## 2014-03-17 NOTE — Code Documentation (Signed)
79yo female arriving to [facility] via [place].  She was at her baseline this morning when she had sudden onset lip and right arm numbness at 1030.  She went to her PCP where they gave her ASA and called EMS.  Patient taken to CT on arrival.  Initial NIHSS 7, see documentation for details and times.  Patient with right arm decreased sensation and right arm and leg drift.  Patient has a h/o strokes with residual left sided weakness and left sided numbness. Patient with h/o stroke, HTN and DM.  Patient is hypertensive on arrival, reports that her BP has been elevated for the past two weeks.  She also reports that she is supposed to be taking ASA, but does not consistently.  Dr. Leonel Ramsay at bedside.  Patient is too mild to treat at this time per MD.  Patient remains in the window for tPA until 1330 if symptoms should worsen.  Patient refused enrollment in PRISMS research trial.  Patient updated on plan of care.  Bedside handoff with ED RN Quillian Quince.

## 2014-03-17 NOTE — ED Notes (Signed)
Activated Code Stroke 

## 2014-03-18 ENCOUNTER — Inpatient Hospital Stay (HOSPITAL_COMMUNITY): Payer: Medicare Other

## 2014-03-18 ENCOUNTER — Encounter: Payer: Self-pay | Admitting: Podiatry

## 2014-03-18 DIAGNOSIS — I10 Essential (primary) hypertension: Secondary | ICD-10-CM

## 2014-03-18 DIAGNOSIS — I6350 Cerebral infarction due to unspecified occlusion or stenosis of unspecified cerebral artery: Principal | ICD-10-CM

## 2014-03-18 DIAGNOSIS — E785 Hyperlipidemia, unspecified: Secondary | ICD-10-CM

## 2014-03-18 DIAGNOSIS — I369 Nonrheumatic tricuspid valve disorder, unspecified: Secondary | ICD-10-CM

## 2014-03-18 DIAGNOSIS — E119 Type 2 diabetes mellitus without complications: Secondary | ICD-10-CM

## 2014-03-18 LAB — LIPID PANEL
Cholesterol: 222 mg/dL — ABNORMAL HIGH (ref 0–200)
HDL: 50 mg/dL (ref >39)
LDL Cholesterol: 139 mg/dL — ABNORMAL HIGH (ref 0–99)
Total CHOL/HDL Ratio: 4.4 RATIO
Triglycerides: 165 mg/dL — ABNORMAL HIGH (ref <150)
VLDL: 33 mg/dL (ref 0–40)

## 2014-03-18 LAB — GLUCOSE, CAPILLARY
Glucose-Capillary: 125 mg/dL — ABNORMAL HIGH (ref 70–99)
Glucose-Capillary: 113 mg/dL — ABNORMAL HIGH (ref 70–99)

## 2014-03-18 LAB — HEMOGLOBIN A1C
HEMOGLOBIN A1C: 6.4 % — AB (ref <5.7)
Mean Plasma Glucose: 137 mg/dL — ABNORMAL HIGH (ref <117)

## 2014-03-18 MED ORDER — ASPIRIN EC 81 MG PO TBEC: 81.0000 mg | DELAYED_RELEASE_TABLET | Freq: Every day | ORAL | Status: DC

## 2014-03-18 MED ORDER — ROSUVASTATIN CALCIUM 40 MG PO TABS: 40.0000 mg | ORAL_TABLET | Freq: Every day | ORAL | Status: DC

## 2014-03-18 NOTE — Progress Notes (Signed)
Stroke Team Progress Note  HISTORY CC: Code Stroke HPI: Shirley Sanders is an 79 y.o. female with prior history HTN, HLD, DM, previous R hemisphere CVA leaving her with residual left arm weakness and Left leg decreased sensation. Patient was in her usual state of health until 10:30 am on 03/17/14 when she developed sudden onset perioral numbness and tingling and numbness and weakness of the RUE. She went to her PCP where they gave her ASA and called EMS. On arrival patient continued to have RUE numbness and weakness. Initial CT head was negative. tPA was not given due to minimal symptoms. Patient endorses inconsistent adherence to ASA therapy.  Date last known well: Date: 03/17/2014  Time last known well: Time: 10:30  tPA Given: No: mild symptoms  NIHSS 7 (but symptoms predominantly attributable to residual from old stroke)  Patient was not administered TPA secondary to minimal symptoms. She was admitted to the Internal Medicine Service for further evaluation and treatment. Neurology is consulting.   SUBJECTIVE No acute events overnight. Patient's family is at the bedside. Patient is alert and conversant, states she has now recovered completely.   OBJECTIVE Most recent Vital Signs: Filed Vitals:   03/18/14 0141 03/18/14 0522 03/18/14 1100 03/18/14 1346  BP: 125/69 100/72 154/90 136/78  Pulse: 89 93 92 89  Temp: 98.6 F (37 C) 98.5 F (36.9 C) 97.6 F (36.4 C) 98.2 F (36.8 C)  TempSrc: Oral Oral Axillary Oral  Resp: 19 18 18 18   Height:      Weight:      SpO2: 98% 98% 97% 96%   CBG (last 3)   Recent Labs  03/17/14 2254 03/18/14 0643 03/18/14 1135  GLUCAP 112* 125* 113*    IV Fluid Intake:     MEDICATIONS  . atorvastatin  40 mg Oral q1800  . enoxaparin (LOVENOX) injection  40 mg Subcutaneous Q24H  . insulin aspart  0-9 Units Subcutaneous TID WC  . loratadine  10 mg Oral Daily  . sodium chloride  3 mL Intravenous Q12H   PRN:  sodium chloride, acetaminophen,  senna-docusate, sodium chloride  Diet:  Carb Control thin liquids Activity:  Up with assistance DVT Prophylaxis:  Lovenox 40 mg daily  CLINICALLY SIGNIFICANT STUDIES Basic Metabolic Panel:  Recent Labs Lab 03/17/14 1243 03/17/14 1253  NA 142 139  K 4.4 4.0  CL 100 104  CO2 26  --   GLUCOSE 110* 112*  BUN 19 19  CREATININE 1.04 1.00  CALCIUM 9.8  --    Liver Function Tests:  Recent Labs Lab 03/17/14 1243  AST 12  ALT 9  ALKPHOS 96  BILITOT <0.2*  PROT 7.6  ALBUMIN 3.7   CBC:  Recent Labs Lab 03/17/14 1243 03/17/14 1253  WBC 7.0  --   NEUTROABS 5.0  --   HGB 12.8 14.3  HCT 39.6 42.0  MCV 87.8  --   PLT 280  --    Coagulation:  Recent Labs Lab 03/17/14 1243  LABPROT 12.9  INR 0.99   Cardiac Enzymes: No results found for this basename: CKTOTAL, CKMB, CKMBINDEX, TROPONINI,  in the last 168 hours Urinalysis:  Recent Labs Lab 03/17/14 1319  COLORURINE STRAW*  LABSPEC 1.008  PHURINE 6.0  GLUCOSEU NEGATIVE  HGBUR NEGATIVE  BILIRUBINUR NEGATIVE  KETONESUR NEGATIVE  PROTEINUR NEGATIVE  UROBILINOGEN 0.2  NITRITE NEGATIVE  LEUKOCYTESUR SMALL*   Lipid Panel    Component Value Date/Time   CHOL 222* 03/18/2014 0502   TRIG 165* 03/18/2014 0502  HDL 50 03/18/2014 0502   CHOLHDL 4.4 03/18/2014 0502   VLDL 33 03/18/2014 0502   LDLCALC 139* 03/18/2014 0502   HgbA1C  Lab Results  Component Value Date   HGBA1C 6.4* 03/18/2014    Urine Drug Screen:     Component Value Date/Time   LABOPIA NONE DETECTED 03/17/2014 1319   COCAINSCRNUR NONE DETECTED 03/17/2014 1319   LABBENZ NONE DETECTED 03/17/2014 1319   AMPHETMU NONE DETECTED 03/17/2014 1319   THCU NONE DETECTED 03/17/2014 1319   LABBARB NONE DETECTED 03/17/2014 1319    Alcohol Level:  Recent Labs Lab 03/17/14 1243  ETH <11    Dg Chest 2 View  03/17/2014 IMPRESSION: No active cardiopulmonary process.    Ct Head Wo Contrast  03/17/2014 IMPRESSION: Atrophy and moderate chronic microvascular ischemic  changes. No acute abnormality.    Mr Brain Wo Contrast  03/17/2014 IMPRESSION: Punctate acute infarction affecting a right posterior frontal gyrus at the vertex.  Chronic ischemic changes elsewhere throughout the brain as outlined above.      Mr Jodene Nam Head/brain Wo Cm  03/18/2014 IMPRESSION: No medium or large size vessel significant stenosis or occlusion.  Branch vessel mild irregularity consistent with atherosclerotic type changes.     2D Echocardiogram pending  Carotid Doppler 03/18/14 <39% stenosis bilat  EKG  03/18/14 Sinus rhythm, abnormal R wave progression  Therapy Recommendations no needs identified  Physical Exam   Awake alert. Afebrile. Head is nontraumatic. Neck is supple without bruit. Hearing is normal. Cardiac exam no murmur or gallop. Lungs are clear to auscultation. Distal pulses are well felt. Neurological Exam ; awake alert oriented x3 with normal speech and language function. Extraocular movements are full range without nystagmus. Fundi were not visualized. Vision acuity seems adequate. Face is symmetric without weakness. Tongue is midline. Motor system exam revealed no upper or lower extremity drift. Mild weakness of the left grip and intrinsic hand muscles. Orbits right over left upper extremity. Mild weakness of the hip flexor and ankle dorsiflexors. Decreased sensation on the left lower face and arm and leg. Coordination slightly impaired on the left. Gait was not tested. NIH SS 2 ( all old deficits0  ASSESSMENT/PLAN Shirley Sanders is a 79 y.o. female with stroke risk factors including strong family history of stroke and personal history of HTN, HLD, DM, and previous R hemisphere CVA leaving her with residual left arm weakness and decreased sensation in the left leg, who presented 03/17/14 with transient RUE numbness and weakness from left brain TIA. She did not receive IV t-PA due to minimal new symptoms. Imaging did not show a new infarct. Patient currently with no  neurological symptoms above baseline. On aspirin 81 mg orally every day prior to admission, but endorses inconsistent adherence. Advised to continue aspirin 81 mg orally every day for secondary stroke prevention. TIA workup underway.   TIA  MRI did not show new infarct  Advised to continue ASA for secondary stroke prevention  LDL 139 on Zocor 80 mg at home, advised primary team to change to Crestor 40  Advised primary team to schedule follow up appt with Dr. Erlinda Hong in 2 months   Hospital day # 1  SIGNED Deirdre Marshell Garfinkel, MSN, ANP-C, [employer], [place] Stroke Team [phone]  I have personally obtained a history, examined the patient, evaluated imaging results, and formulated the assessment and plan of care. I agree with the above.  Antony Contras, MD  To contact Stroke Continuity provider, please refer to [email or web address] After hours,  contact General Neurology

## 2014-03-18 NOTE — Progress Notes (Signed)
PT Cancellation Note  Patient Details Name: Shirley Sanders MRN: [id number] DOB: 1935/05/05   Cancelled Treatment:    Reason Eval/Treat Not Completed: Patient at procedure or test/unavailable. Pt off floor. Will re-attempt at next available time.    Elie Confer [place], [place] 03/18/2014, 9:37 AM

## 2014-03-18 NOTE — Progress Notes (Signed)
Subjective: Shirley Sanders is doing quite well this morning.  States all numbness/tingling has resolved, speech at baseline.  She has been up going to the bathroom on her own, feels steady on her feet.  Wanting to go home.   Objective: Vital signs in last 24 hours: Filed Vitals:   03/17/14 1823 03/17/14 2037 03/18/14 0141 03/18/14 0522  BP: 177/88 150/83 125/69 100/72  Pulse: 84 87 89 93  Temp: 97.6 F (36.4 C) 97.7 F (36.5 C) 98.6 F (37 C) 98.5 F (36.9 C)  TempSrc: Oral Oral Oral Oral  Resp: 18 19 19 18   Height:      Weight:      SpO2: 98% 97% 98% 98%   PEX General: alert, cooperative, and in no apparent distress HEENT: NCAT, vision grossly intact, oropharynx clear and non-erythematous  Neck: supple, no lymphadenopathy Lungs: clear to ascultation bilaterally, normal work of respiration, no wheezes, rales, ronchi Heart: regular rate and rhythm, no murmurs, gallops, or rubs Abdomen: soft, non-tender, non-distended, normal bowel sounds  Extremities: 2+ DP/PT pulses bilaterally, no cyanosis, clubbing, or edema Neurologic: alert & oriented X3, cranial nerves II-XII intact, 4+/5 strength througout, sensation intact throughout, normal gait   Lab Results: Basic Metabolic Panel:  Recent Labs Lab 03/17/14 1243 03/17/14 1253  NA 142 139  K 4.4 4.0  CL 100 104  CO2 26  --   GLUCOSE 110* 112*  BUN 19 19  CREATININE 1.04 1.00  CALCIUM 9.8  --    Liver Function Tests:  Recent Labs Lab 03/17/14 1243  AST 12  ALT 9  ALKPHOS 96  BILITOT <0.2*  PROT 7.6  ALBUMIN 3.7   CBC:  Recent Labs Lab 03/17/14 1243 03/17/14 1253  WBC 7.0  --   NEUTROABS 5.0  --   HGB 12.8 14.3  HCT 39.6 42.0  MCV 87.8  --   PLT 280  --    CBG:  Recent Labs Lab 03/17/14 2254 03/18/14 0643  GLUCAP 112* 125*   Fasting Lipid Panel:  Recent Labs Lab 03/18/14 0502  CHOL 222*  HDL 50  LDLCALC 139*  TRIG 165*  CHOLHDL 4.4   Coagulation:  Recent Labs Lab 03/17/14 1243    LABPROT 12.9  INR 0.99   Urine Drug Screen: Drugs of Abuse     Component Value Date/Time   LABOPIA NONE DETECTED 03/17/2014 1319   COCAINSCRNUR NONE DETECTED 03/17/2014 1319   LABBENZ NONE DETECTED 03/17/2014 1319   AMPHETMU NONE DETECTED 03/17/2014 1319   THCU NONE DETECTED 03/17/2014 1319   LABBARB NONE DETECTED 03/17/2014 1319    Alcohol Level:  Recent Labs Lab 03/17/14 1243  ETH <11   Urinalysis:  Recent Labs Lab 03/17/14 1319  COLORURINE STRAW*  LABSPEC 1.008  PHURINE 6.0  GLUCOSEU NEGATIVE  HGBUR NEGATIVE  BILIRUBINUR NEGATIVE  KETONESUR NEGATIVE  PROTEINUR NEGATIVE  UROBILINOGEN 0.2  NITRITE NEGATIVE  LEUKOCYTESUR SMALL*    Studies/Results: Dg Chest 2 View  03/17/2014   CLINICAL DATA:  Stroke.  EXAM: CHEST  2 VIEW  COMPARISON:  None.  FINDINGS: The heart size and mediastinal contours are normal. The lungs are clear. There is no pleural effusion or pneumothorax. No acute osseous findings are identified. Old rib fractures are noted on the right. There are diffuse osteophytes of the thoracolumbar spine. Upper lumbar spine compression deformity does not appear acute. Telemetry leads overlie the chest.  IMPRESSION: No active cardiopulmonary process.   Electronically Signed   By: Modesta Messing.D.  On: 03/17/2014 20:00   Ct Head Wo Contrast  03/17/2014   CLINICAL DATA:  Code stroke.  Right face and arm numbness  EXAM: CT HEAD WITHOUT CONTRAST  TECHNIQUE: Contiguous axial images were obtained from the base of the skull through the vertex without intravenous contrast.  COMPARISON:  None.  FINDINGS: Age-appropriate atrophy. Moderate chronic microvascular ischemic changes in the white matter. Chronic infarct in the right putamen and deep white matter.  Negative for acute infarct.  Negative for hemorrhage or mass.  Calvarium intact.  IMPRESSION: Atrophy and moderate chronic microvascular ischemic changes. No acute abnormality.  Critical Value/emergent results were called by  telephone at the time of interpretation on 03/17/2014 at 1:03 PM to Dr. Leonel Ramsay , who verbally acknowledged these results.   Electronically Signed   By: Franchot Gallo M.D.   On: 03/17/2014 13:03   Mr Brain Wo Contrast  03/17/2014   CLINICAL DATA:  History of stroke with left arm and leg numbness. Sudden onset of right arm numbness and right arm weakness.  EXAM: MRI HEAD WITHOUT CONTRAST  TECHNIQUE: Multiplanar, multiecho pulse sequences of the brain and surrounding structures were obtained without intravenous contrast.  COMPARISON:  Head CT same day  FINDINGS: There is a punctate acute infarction along the surface of a right frontal gyrus at the vertex. This could go along with the patient's presentation. No evidence of swelling or hemorrhage.  Brainstem and cerebellum do not show any focal insult. The cerebral hemispheres show moderate chronic small vessel changes throughout the white matter an old infarction affecting the right posterior basal ganglia, posterior limb internal capsule and corona radiata. No mass lesion, hydrocephalus or extra-axial collection. No pituitary mass. No inflammatory sinus disease. No skull or skullbase lesion. Major vessels at the base of the brain show flow.  IMPRESSION: Punctate acute infarction affecting a right posterior frontal gyrus at the vertex.  Chronic ischemic changes elsewhere throughout the brain as outlined above.   Electronically Signed   By: Nelson Chimes M.D.   On: 03/17/2014 14:32   Medications: I have reviewed the patient's current medications. Scheduled Meds: . atorvastatin  40 mg Oral q1800  . enoxaparin (LOVENOX) injection  40 mg Subcutaneous Q24H  . insulin aspart  0-9 Units Subcutaneous TID WC  . loratadine  10 mg Oral Daily  . sodium chloride  3 mL Intravenous Q12H   PRN Meds:.sodium chloride, acetaminophen, senna-docusate, sodium chloride Assessment/Plan: #Acute stroke- Patient presented with perioral and right arm numbness/tingling; last  normal at 10am on 6/10. CT head negative for acute abnormality but with atrophy and moderate chronic ischemic changes. MRI showed punctate acute infarction affecting right posterior frontal gyrus (though majority of patient's symptoms on right side). tPA was not given due to minimal and resolving symptoms. Stroke risk factors include prior CVA, DM2, HTN, HL. Patient is also not compliant with daily ASA. Troponin x 1 negative.  [id number]. Dopplers negative.  -neurology consult, appreciate recs  -ASA daily, statin  -MRA read pending -echo read pending -A1C pending -PT/OT evals pending -carb mod diet  -frequent neuro checks   #HTN- Normotensive. On Coreg 12.5 mg BID, HCTZ 25 mg daily, lisinopril 40 mg daily at home.  -holding home BP meds for permissive HTN   #DM2- No A1C on record as PCP is in [place], [place]. On metformin 500 mg BID and glimepiride 2 mg daily at home.  -CBGs AC  -SSI-sensitive   Dispo: Anticipated discharge today.   The patient does have a current PCP (  Leonides Sake, MD) and does need an [facility] hospital follow-up appointment after discharge.   .Services Needed at time of discharge: Y = Yes, Blank = No PT:   OT:   RN:   Equipment:   Other:     LOS: 1 day   Ivin Poot, MD 03/18/2014, 8:15 AM

## 2014-03-18 NOTE — Progress Notes (Signed)
Patient d/c but awaiting on medication to be fixed. Will d/c her home as soon as it is fixed.

## 2014-03-18 NOTE — Progress Notes (Signed)
*  PRELIMINARY RESULTS* Vascular Ultrasound Carotid Duplex (Doppler) has been completed.  Preliminary findings: Bilateral:  1-39% ICA stenosis.  Vertebral artery flow is antegrade.      Landry Mellow, RDMS, RVT  03/18/2014, 9:45 AM

## 2014-03-18 NOTE — H&P (Signed)
Internal Medicine Attending Admission Note Date: 03/18/2014  Patient name: Shirley Sanders Medical record number: [id number] Date of birth: 10/01/1935 Age: 79 y.o. Gender: female  I saw and evaluated the patient. I reviewed the resident's note and I agree with the resident's findings and plan as documented in the resident's note.  Chief Complaint(s): R lip numbness. Slurred speech  History - key components related to admission: 79 y/o female with PMH of CVA (2003), DM, HTN who presents with perioral numbness, R UE "heaviness" and slurred speech. Patient states she was feeling well tilll approx 1030 AM yesterday morning when she developed R sided lip numbness after which she was noted to have slurred speech by a coworker. She was takem to her pCPs office and was sent in to the hospital and given asa. No CP< no sob, no palpitations, no diaphoresis. Pt did complain of mild HA yesterday eveneing - relieved with tylenol. Remaining ROS negative   Physical Exam - key components related to admission:  Filed Vitals:   03/17/14 1823 03/17/14 2037 03/18/14 0141 03/18/14 0522  BP: 177/88 150/83 125/69 100/72  Pulse: 84 87 89 93  Temp: 97.6 F (36.4 C) 97.7 F (36.5 C) 98.6 F (37 C) 98.5 F (36.9 C)  TempSrc: Oral Oral Oral Oral  Resp: 18 19 19 18   Height:      Weight:      SpO2: 98% 97% 98% 98%  Cardio- RRR, normal heart sounds Lungs- CTA b/l Abd- soft, non tender, non distended, BS + Ext- no pedal edema Gen- AAO*3, nAD Neuro- sensation intact, mildly decreased strength RUE  Lab results:   Basic Metabolic Panel:  Recent Labs  03/17/14 1243 03/17/14 1253  NA 142 139  K 4.4 4.0  CL 100 104  CO2 26  --   GLUCOSE 110* 112*  BUN 19 19  CREATININE 1.04 1.00  CALCIUM 9.8  --    Liver Function Tests:  Recent Labs  03/17/14 1243  AST 12  ALT 9  ALKPHOS 96  BILITOT <0.2*  PROT 7.6  ALBUMIN 3.7   No results found for this basename: LIPASE, AMYLASE,  in the last 72 hours No  results found for this basename: AMMONIA,  in the last 72 hours CBC:  Recent Labs  03/17/14 1243 03/17/14 1253  WBC 7.0  --   NEUTROABS 5.0  --   HGB 12.8 14.3  HCT 39.6 42.0  MCV 87.8  --   PLT 280  --    Cardiac Enzymes: No results found for this basename: CKTOTAL, CKMB, CKMBINDEX, TROPONINI,  in the last 72 hours BNP: No components found with this basename: POCBNP,  D-Dimer: No results found for this basename: DDIMER,  in the last 72 hours CBG:  Recent Labs  03/17/14 2254 03/18/14 0643  GLUCAP 112* 125*   Hemoglobin A1C: No results found for this basename: HGBA1C,  in the last 72 hours Fasting Lipid Panel:  Recent Labs  03/18/14 0502  CHOL 222*  HDL 50  LDLCALC 139*  TRIG 165*  CHOLHDL 4.4   Thyroid Function Tests: No results found for this basename: TSH, T4TOTAL, FREET4, T3FREE, THYROIDAB,  in the last 72 hours Anemia Panel: No results found for this basename: VITAMINB12, FOLATE, FERRITIN, TIBC, IRON, RETICCTPCT,  in the last 72 hours Coagulation:  Recent Labs  03/17/14 1243  INR 0.99   Alcohol Level:  Recent Labs  03/17/14 1243  ETH <11    Imaging results:  Dg Chest 2 View  03/17/2014   CLINICAL DATA:  Stroke.  EXAM: CHEST  2 VIEW  COMPARISON:  None.  FINDINGS: The heart size and mediastinal contours are normal. The lungs are clear. There is no pleural effusion or pneumothorax. No acute osseous findings are identified. Old rib fractures are noted on the right. There are diffuse osteophytes of the thoracolumbar spine. Upper lumbar spine compression deformity does not appear acute. Telemetry leads overlie the chest.  IMPRESSION: No active cardiopulmonary process.   Electronically Signed   By: Camie Patience M.D.   On: 03/17/2014 20:00   Ct Head Wo Contrast  03/17/2014   CLINICAL DATA:  Code stroke.  Right face and arm numbness  EXAM: CT HEAD WITHOUT CONTRAST  TECHNIQUE: Contiguous axial images were obtained from the base of the skull through the  vertex without intravenous contrast.  COMPARISON:  None.  FINDINGS: Age-appropriate atrophy. Moderate chronic microvascular ischemic changes in the white matter. Chronic infarct in the right putamen and deep white matter.  Negative for acute infarct.  Negative for hemorrhage or mass.  Calvarium intact.  IMPRESSION: Atrophy and moderate chronic microvascular ischemic changes. No acute abnormality.  Critical Value/emergent results were called by telephone at the time of interpretation on 03/17/2014 at 1:03 PM to Dr. Leonel Ramsay , who verbally acknowledged these results.   Electronically Signed   By: Franchot Gallo M.D.   On: 03/17/2014 13:03   Mr Brain Wo Contrast  03/17/2014   CLINICAL DATA:  History of stroke with left arm and leg numbness. Sudden onset of right arm numbness and right arm weakness.  EXAM: MRI HEAD WITHOUT CONTRAST  TECHNIQUE: Multiplanar, multiecho pulse sequences of the brain and surrounding structures were obtained without intravenous contrast.  COMPARISON:  Head CT same day  FINDINGS: There is a punctate acute infarction along the surface of a right frontal gyrus at the vertex. This could go along with the patient's presentation. No evidence of swelling or hemorrhage.  Brainstem and cerebellum do not show any focal insult. The cerebral hemispheres show moderate chronic small vessel changes throughout the white matter an old infarction affecting the right posterior basal ganglia, posterior limb internal capsule and corona radiata. No mass lesion, hydrocephalus or extra-axial collection. No pituitary mass. No inflammatory sinus disease. No skull or skullbase lesion. Major vessels at the base of the brain show flow.  IMPRESSION: Punctate acute infarction affecting a right posterior frontal gyrus at the vertex.  Chronic ischemic changes elsewhere throughout the brain as outlined above.   Electronically Signed   By: Nelson Chimes M.D.   On: 03/17/2014 14:32    Assessment & Plan by  Problem:  Principal Problem:   Acute cerebral infarction Active Problems:   CVA (cerebral infarction)   Hyperlipidemia   Hypertension   Diabetes mellitus, type 2   Acute CVA: - MRI with acute infarct R posterior central gyrus - neuro recommendations appreciated - PT/OT consult - f/u 2 D ECHO, carotid dopplers - c/w asa 81 mg, statin  HTN: - Home BP meds on hold for now - Will d/w neuro about resuming on d/c - BP stable. Will monitor  DM: - CBG stable on SSI - resume metformin on d/c  Likely d/c home today if test results are wnl

## 2014-03-18 NOTE — Progress Notes (Signed)
Patient wheeled down now, accompanied by family. Declined outpatient speech Therapy as recommended. Assessments remained unchanged prior to discharge.

## 2014-03-18 NOTE — Progress Notes (Signed)
Utilization review completed. Marshel Golubski, RN, BSN. 

## 2014-03-18 NOTE — Evaluation (Signed)
Physical Therapy Evaluation Patient Details Name: Shirley Sanders MRN: [id number] DOB: 01-May-1935 Today's Date: 03/18/2014   History of Present Illness  79 y/o female with PMH of CVA (2003), DM, HTN who presents with perioral numbness, R UE "heaviness" and slurred speech. Patient states she was feeling well tilll approx 1030 AM yesterday morning when she developed R sided lip numbness after which she was noted to have slurred speech by a coworker. MRI revealed punctate acute infarction affecting right posterior frontal gyrus   Clinical Impression  Pt adm from home due to the above. All symptoms appear to be resolved. Pt with no focal weakness or balance deficits at this time. Pt educated on stroke symptoms. No further acute PT needs at this time.     Follow Up Recommendations No PT follow up    Equipment Recommendations  None recommended by PT    Recommendations for Other Services       Precautions / Restrictions Precautions Precautions: Fall Precaution Comments: reports she tripped 2 weeks ago at work  Restrictions Weight Bearing Restrictions: No      Mobility  Bed Mobility Overal bed mobility: Modified Independent             General bed mobility comments: HOB flattened; effortful but no physical (A) required   Transfers Overall transfer level: Modified independent Equipment used: None Transfers: Sit to/from Stand Sit to Stand: Modified independent (Device/Increase time)         General transfer comment: no sway or LOB noted  Ambulation/Gait Ambulation/Gait assistance: Modified independent (Device/Increase time) Ambulation Distance (Feet): 300 Feet Assistive device: None Gait Pattern/deviations: Step-through pattern;Decreased stride length;Narrow base of support Gait velocity: decreased vs her baseline per pt   General Gait Details: pt appears to be at baseline; slightly unsteady at times but recovers independently without LOB  Stairs Stairs: Yes Stairs  assistance: Supervision Stair Management: No rails;Alternating pattern;Forwards Number of Stairs: 3 General stair comments: cues for safe management   Wheelchair Mobility    Modified Rankin (Stroke Patients Only) Modified Rankin (Stroke Patients Only) Pre-Morbid Rankin Score: No symptoms Modified Rankin: No symptoms     Balance Overall balance assessment: Modified Independent                           High level balance activites: Head turns;Sudden stops;Turns;Direction changes High Level Balance Comments: pt ablet o pick objects up off ground; no LOB noted with activities              Pertinent Vitals/Pain No c/o pain     Home Living Family/patient expects to be discharged to:: Private residence Living Arrangements: Alone Available Help at Discharge: Family;Available PRN/intermittently Type of Home: House Home Access: Stairs to enter Entrance Stairs-Rails: None Entrance Stairs-Number of Steps: 2 Home Layout: One level Home Equipment: None Additional Comments: pt has tub shower     Prior Function Level of Independence: Independent         Comments: pt with decreased vision in Lt eye due to macular degeneration      Hand Dominance   Dominant Hand: Right    Extremity/Trunk Assessment   Upper Extremity Assessment: Defer to OT evaluation           Lower Extremity Assessment: Overall WFL for tasks assessed      Cervical / Trunk Assessment: Normal  Communication   Communication: No difficulties  Cognition Arousal/Alertness: Awake/alert Behavior During Therapy: WFL for tasks assessed/performed Overall Cognitive Status:  Within Functional Limits for tasks assessed                      General Comments General comments (skin integrity, edema, etc.): educated on signs of stroke and early detection benefits    Exercises        Assessment/Plan    PT Assessment Patent does not need any further PT services  PT Diagnosis      PT Problem List    PT Treatment Interventions     PT Goals (Current goals can be found in the Care Plan section) Acute Rehab PT Goals Patient Stated Goal: home today PT Goal Formulation: No goals set, d/c therapy    Frequency     Barriers to discharge        Co-evaluation               End of Session Equipment Utilized During Treatment: Gait belt Activity Tolerance: Patient tolerated treatment well Patient left: in bed;with call bell/phone within reach;with family/visitor present Nurse Communication: Mobility status         Time: [id number] PT Time Calculation (min): 17 min   Charges:   PT Evaluation $Initial PT Evaluation Tier I: 1 Procedure PT Treatments $Gait Training: 8-22 mins   PT G CodesGustavus Bryant, [place]  [phone] 03/18/2014, 11:14 AM

## 2014-03-18 NOTE — Progress Notes (Signed)
OT Cancellation Note  Patient Details Name: ZINIA FITTIPALDI MRN: [id number] DOB: [birth year]   Cancelled Treatment:    Reason Eval/Treat Not Completed: OT screened, no needs identified, will sign off.  Darlina Rumpf [place], OTR/L [id number] 03/18/2014, 12:39 PM

## 2014-03-18 NOTE — Discharge Summary (Signed)
INTERNAL MEDICINE ATTENDING DISCHARGE COSIGN   I evaluated the patient on the day of discharge and discussed the discharge plan with my resident team. I agree with the discharge documentation and disposition.   Shirley Sanders 03/18/2014, 4:40 PM

## 2014-03-18 NOTE — Progress Notes (Signed)
Echo Lab  2D Echocardiogram completed.  [place], RDCS 03/18/2014 9:43 AM

## 2014-03-18 NOTE — Discharge Summary (Signed)
Name: Shirley Sanders MRN: [id number] DOB: 11/21/1934 79 y.o. PCP: Leonides Sake, MD  Date of Admission: 03/17/2014 11:58 AM Date of Discharge: 03/18/2014 Attending Physician: Aldine Contes, MD  Discharge Diagnosis: Principal Problem:   Acute cerebral infarction Active Problems:   CVA (cerebral infarction)   Hyperlipidemia   Hypertension   Diabetes mellitus, type 2  Discharge Medications:   Medication List         aspirin EC 81 MG tablet  Take 1 tablet (81 mg total) by mouth daily.     carvedilol 12.5 MG tablet  Commonly known as:  COREG  Take 25 mg by mouth 2 (two) times daily with a meal.     cetirizine 10 MG tablet  Commonly known as:  ZYRTEC  Take 10 mg by mouth daily.     glimepiride 2 MG tablet  Commonly known as:  AMARYL  Take 1 mg by mouth daily with breakfast.     hydrochlorothiazide 25 MG tablet  Commonly known as:  HYDRODIURIL  Take 25 mg by mouth daily.     lisinopril 40 MG tablet  Commonly known as:  PRINIVIL,ZESTRIL  Take 40 mg by mouth daily.     metFORMIN 500 MG tablet  Commonly known as:  GLUCOPHAGE  Take 500 mg by mouth 2 (two) times daily with a meal.     simvastatin 80 MG tablet  Commonly known as:  ZOCOR  Take 80 mg by mouth daily.        Disposition and follow-up:   [name] was discharged from [facility] in Stable condition.  At the hospital follow up visit please address:  1.  Status of strength and speech; did outpatient SLP contact patient to set up appointment time?  2.  Labs / imaging needed at time of follow-up: none  3.  Pending labs/ test needing follow-up: none  Follow-up Appointments: Follow-up Information   Follow up with [facility], MD On 03/22/2014. (11:45am)    Specialty:  Family Medicine   Contact information:   Dr. Daiva Eves [address] [place] [address] [phone]       Follow up with [place] In 2 months.   Contact  information:   [address] [place] [address] [phone]      Discharge Instructions: Discharge Instructions   Call MD for:  persistant dizziness or light-headedness    Complete by:  As directed      Diet - low sodium heart healthy    Complete by:  As directed      Increase activity slowly    Complete by:  As directed            Consultations:  none  Procedures Performed:  Dg Chest 2 View  03/17/2014   CLINICAL DATA:  Stroke.  EXAM: CHEST  2 VIEW  COMPARISON:  None.  FINDINGS: The heart size and mediastinal contours are normal. The lungs are clear. There is no pleural effusion or pneumothorax. No acute osseous findings are identified. Old rib fractures are noted on the right. There are diffuse osteophytes of the thoracolumbar spine. Upper lumbar spine compression deformity does not appear acute. Telemetry leads overlie the chest.  IMPRESSION: No active cardiopulmonary process.   Electronically Signed   By: Camie Patience M.D.   On: 03/17/2014 20:00   Ct Head Wo Contrast  03/17/2014   CLINICAL DATA:  Code stroke.  Right face and arm numbness  EXAM:  CT HEAD WITHOUT CONTRAST  TECHNIQUE: Contiguous axial images were obtained from the base of the skull through the vertex without intravenous contrast.  COMPARISON:  None.  FINDINGS: Age-appropriate atrophy. Moderate chronic microvascular ischemic changes in the white matter. Chronic infarct in the right putamen and deep white matter.  Negative for acute infarct.  Negative for hemorrhage or mass.  Calvarium intact.  IMPRESSION: Atrophy and moderate chronic microvascular ischemic changes. No acute abnormality.  Critical Value/emergent results were called by telephone at the time of interpretation on 03/17/2014 at 1:03 PM to Dr. Leonel Ramsay , who verbally acknowledged these results.   Electronically Signed   By: Franchot Gallo M.D.   On: 03/17/2014 13:03   Mr Brain Wo Contrast  03/17/2014   CLINICAL DATA:  History of stroke with  left arm and leg numbness. Sudden onset of right arm numbness and right arm weakness.  EXAM: MRI HEAD WITHOUT CONTRAST  TECHNIQUE: Multiplanar, multiecho pulse sequences of the brain and surrounding structures were obtained without intravenous contrast.  COMPARISON:  Head CT same day  FINDINGS: There is a punctate acute infarction along the surface of a right frontal gyrus at the vertex. This could go along with the patient's presentation. No evidence of swelling or hemorrhage.  Brainstem and cerebellum do not show any focal insult. The cerebral hemispheres show moderate chronic small vessel changes throughout the white matter an old infarction affecting the right posterior basal ganglia, posterior limb internal capsule and corona radiata. No mass lesion, hydrocephalus or extra-axial collection. No pituitary mass. No inflammatory sinus disease. No skull or skullbase lesion. Major vessels at the base of the brain show flow.  IMPRESSION: Punctate acute infarction affecting a right posterior frontal gyrus at the vertex.  Chronic ischemic changes elsewhere throughout the brain as outlined above.   Electronically Signed   By: Nelson Chimes M.D.   On: 03/17/2014 14:32    2D Echo: EF 55-60% with normal systolic function, grade 1 diastolic dysfunction.   Admission HPI:  Shirley Sanders is a 79 year old woman with history of stroke in 2003, TIA x 2, DM2, HTN who presents with perioral and right arm numbness/tingling, slurred speech.  Patient states she was at work this morning around 10:30am when she felt that her lips were tingling and swelling. She asked a coworker if her lips looked swollen, and he said no but that her speech was slurred. She then had another coworker take her to her PCP's office where they gave her an aspirin and called 911.  Symptoms have been improving throughout the day, and patient now feels that her speech is at baseline but can "still feel it a little" in her right arm. She has chronic residual  numbness and decreased strength in her left arm and leg since previous stroke in 2003. She is compliant with most of her medications but only takes ASA intermittently. No fever, chest pain, shortness of breath, abdominal pain, dizziness, LOC.  In ED, CT head negative for acute abnormality. Neurology was consulted and recommended full stroke workup; IMTS subsequently called for admission.    Hospital Course by problem list: 1. Acute stroke- Patient presented with perioral and right arm numbness/tingling; last normal at 10am on 6/10. tPA was not given due to minimal and resolving symptoms. CT head negative for acute abnormality but with atrophy and moderate chronic ischemic changes. MRI/MRA brain showed punctate acute infarction affecting right posterior frontal gyrus (though majority of patient's symptoms on right side); no medium or large  size vessel stenosis or occlusion.  Stroke risk factors include prior CVA, DM2, HTN, HL. Patient previously not compliant with daily ASA prior.  Echo showed EF 55-60% with normal systolic function, grade 1 diastolic dysfunction. Dopplers negative. LDL139, A1C 6.4%. Troponin x 1 negative.  PT/OT/SLP evaluated patient and recommended only outpatient SLP which will be arranged by case management.  Patient discharged on ASA, statin; emphasized importance of compliance with daily ASA for secondary stroke prevention.   2. HTN- Patient was slightly hypertensive on admission but normotensive on day of discharge. On Coreg 12.5 mg BID, HCTZ 25 mg daily, lisinopril 40 mg daily at home; these medications were held while inpatient for permissive hypertension in setting of acute stroke.  Patient was instructed to restart her home antihypertensives on 03/19/14 (48 hours after stroke).   3. DM2- A1C 6.4%. On metformin 500 mg BID and glimepiride 2 mg daily at home.  Patient was placed on SSI-sensitive while inpatient, discharged back on home meds.    Discharge Vitals:   BP 154/90   Pulse 92  Temp(Src) 97.6 F (36.4 C) (Axillary)  Resp 18  Ht 5\' 1"  (1.549 m)  Wt 137 lb 2 oz (62.2 kg)  BMI 25.92 kg/m2  SpO2 97%  Discharge Labs:  Results for orders placed during the hospital encounter of 03/17/14 (from the past 24 hour(s))  I-STAT TROPOININ, ED     Status: None   Collection Time    03/17/14 12:50 PM      Result Value Ref Range   Troponin i, poc 0.00  0.00 - 0.08 ng/mL   Comment 3           I-STAT CHEM 8, ED     Status: Abnormal   Collection Time    03/17/14 12:53 PM      Result Value Ref Range   Sodium 139  137 - 147 mEq/L   Potassium 4.0  3.7 - 5.3 mEq/L   Chloride 104  96 - 112 mEq/L   BUN 19  6 - 23 mg/dL   Creatinine, Ser 1.00  0.50 - 1.10 mg/dL   Glucose, Bld 112 (*) 70 - 99 mg/dL   Calcium, Ion 1.13  1.13 - 1.30 mmol/L   TCO2 22  0 - 100 mmol/L   Hemoglobin 14.3  12.0 - 15.0 g/dL   HCT 42.0  36.0 - 46.0 %  URINE RAPID DRUG SCREEN (HOSP PERFORMED)     Status: None   Collection Time    03/17/14  1:19 PM      Result Value Ref Range   Opiates NONE DETECTED  NONE DETECTED   Cocaine NONE DETECTED  NONE DETECTED   Benzodiazepines NONE DETECTED  NONE DETECTED   Amphetamines NONE DETECTED  NONE DETECTED   Tetrahydrocannabinol NONE DETECTED  NONE DETECTED   Barbiturates NONE DETECTED  NONE DETECTED  URINALYSIS, ROUTINE W REFLEX MICROSCOPIC     Status: Abnormal   Collection Time    03/17/14  1:19 PM      Result Value Ref Range   Color, Urine STRAW (*) YELLOW   APPearance CLEAR  CLEAR   Specific Gravity, Urine 1.008  1.005 - 1.030   pH 6.0  5.0 - 8.0   Glucose, UA NEGATIVE  NEGATIVE mg/dL   Hgb urine dipstick NEGATIVE  NEGATIVE   Bilirubin Urine NEGATIVE  NEGATIVE   Ketones, ur NEGATIVE  NEGATIVE mg/dL   Protein, ur NEGATIVE  NEGATIVE mg/dL   Urobilinogen, UA 0.2  0.0 - 1.0  mg/dL   Nitrite NEGATIVE  NEGATIVE   Leukocytes, UA SMALL (*) NEGATIVE  URINE MICROSCOPIC-ADD ON     Status: Abnormal   Collection Time    03/17/14  1:19 PM      Result  Value Ref Range   Squamous Epithelial / LPF RARE  RARE   WBC, UA 7-10  <3 WBC/hpf   RBC / HPF 0-2  <3 RBC/hpf   Bacteria, UA FEW (*) RARE  GLUCOSE, CAPILLARY     Status: Abnormal   Collection Time    03/17/14 10:54 PM      Result Value Ref Range   Glucose-Capillary 112 (*) 70 - 99 mg/dL  HEMOGLOBIN A1C     Status: Abnormal   Collection Time    03/18/14  5:02 AM      Result Value Ref Range   Hemoglobin A1C 6.4 (*) <5.7 %   Mean Plasma Glucose 137 (*) <117 mg/dL  LIPID PANEL     Status: Abnormal   Collection Time    03/18/14  5:02 AM      Result Value Ref Range   Cholesterol 222 (*) 0 - 200 mg/dL   Triglycerides 165 (*) <150 mg/dL   HDL 50  >39 mg/dL   Total CHOL/HDL Ratio 4.4     VLDL 33  0 - 40 mg/dL   LDL Cholesterol 139 (*) 0 - 99 mg/dL  GLUCOSE, CAPILLARY     Status: Abnormal   Collection Time    03/18/14  6:43 AM      Result Value Ref Range   Glucose-Capillary 125 (*) 70 - 99 mg/dL   Comment 1 Documented in Chart     Comment 2 Notify RN    GLUCOSE, CAPILLARY     Status: Abnormal   Collection Time    03/18/14 11:35 AM      Result Value Ref Range   Glucose-Capillary 113 (*) 70 - 99 mg/dL   Comment 1 Documented in Chart     Comment 2 Notify RN      Signed: Ivin Poot, MD 03/18/2014, 12:44 PM   Time Spent on Discharge: 35 minutes Services Ordered on Discharge: none Equipment Ordered on Discharge: none

## 2014-03-18 NOTE — Discharge Instructions (Signed)
Please take aspirin every day to prevent further strokes!  You should restart your blood pressure medications (carvedilol, hydrochlorothiazide, lisinopril) TOMORROW 6/12.  Don't forget to follow-up with Dr. Lisbeth Ply on Monday at 11:45am.  We are changing your cholesterol medicine from Zocor to Crestor per your conversation with neurology.   You should also schedule a follow-up appointment with at [facility] in 2 months, ask for Dr.Xu.   Ischemic Stroke A stroke (cerebrovascular accident) is the sudden death of brain tissue. It is a medical emergency. A stroke can cause permanent loss of brain function. This can cause problems with different parts of your body. A transient ischemic attack (TIA) is different because it does not cause permanent damage. A TIA is a short-lived problem of poor blood flow affecting a part of the brain. A TIA is also a serious problem because having a TIA greatly increases the chances of having a stroke. When symptoms first develop, you cannot know if the problem might be a stroke or TIA. CAUSES  A stroke is caused by a decrease of oxygen supply to an area of your brain. It is usually the result of a small blood clot or collection of cholesterol or fat (plaque) that blocks blood flow in the brain. A stroke can also be caused by blocked or damaged carotid arteries.  RISK FACTORS  High blood pressure (hypertension).  High cholesterol.  Diabetes mellitus.  Heart disease.  The build up of plaque in the blood vessels (peripheral artery disease or atherosclerosis).  The build up of plaque in the blood vessels providing blood and oxygen to the brain (carotid artery stenosis).  An abnormal heart rhythm (atrial fibrillation).  Obesity.  Smoking.  Taking oral contraceptives (especially in combination with smoking).  Physical inactivity.  A diet high in fats, salt (sodium), and calories.  Alcohol use.  Use of illegal drugs (especially cocaine  and methamphetamine).  Being African American.  Being over the age of 28.  Family history of stroke.  Previous history of blood clots, stroke, TIA, or heart attack.  Sickle cell disease. SYMPTOMS  These symptoms usually develop suddenly, or may be newly present upon awakening from sleep:  Sudden weakness or numbness of the face, arm, or leg, especially on one side of the body.  Sudden trouble walking or difficulty moving arms or legs.  Sudden confusion.  Sudden personality changes.  Trouble speaking (aphasia) or understanding.  Difficulty swallowing.  Sudden trouble seeing in one or both eyes.  Double vision.  Dizziness.  Loss of balance or coordination.  Sudden severe headache with no known cause.  Trouble reading or writing. DIAGNOSIS  Your caregiver can often determine the presence or absence of a stroke based on your symptoms, history, and physical exam. Computed tomography (CT) of the brain is usually performed to confirm the stroke, determine causes, and determine stroke severity. Other tests may be done to find the cause of the stroke. These tests may include:  Electrocardiography.  Continuous heart monitoring.  Echocardiography.  Carotid ultrasonography.  Magnetic resonance imaging (MRI).  A scan of the brain circulation.  Blood tests. PREVENTION  The risk of a stroke can be decreased by appropriately treating high blood pressure, high cholesterol, diabetes, heart disease, and obesity and by quitting smoking, limiting alcohol, and staying physically active. TREATMENT  Time is of the essence. It is important to seek treatment within 3 4 hours of the start of symptoms because you may receive a medicine to dissolve the clot (thrombolytic) that  cannot be given after that time. Even if you do not know when your symptoms began, get treatment as soon as possible. After the 4 hour window has passed, treatment may include rest, oxygen, intravenous (IV) fluids,  and medicines to thin the blood (anticoagulants). Treatment of stroke depends on the duration, severity, and cause of your symptoms. Medicines and diet may be used to address diabetes, high blood pressure, and other risk factors. Physical, speech, and occupational therapists will assess you and work to improve any functions impaired by the stroke. Measures will be taken to prevent short-term and long-term complications, including infection from breathing foreign material into the lungs (aspiration pneumonia), blood clots in the legs, bedsores, and falls. Rarely, surgery may be needed to remove large blood clots or to open up blocked arteries. HOME CARE INSTRUCTIONS   Take all medicines prescribed by your caregiver. Follow the directions carefully. Medicines may be used to control risk factors for a stroke. Be sure you understand all your medicine instructions.  You may be told to take aspirin or the anticoagulant warfarin. Warfarin needs to be taken exactly as instructed.  Too much and too little warfarin are both dangerous. Too much warfarin increases the risk of bleeding. Too little warfarin continues to allow the risk for blood clots. While taking warfarin, you will need to have regular blood tests to measure your blood clotting time. These blood tests usually include both the PT and INR tests. The PT and INR results allow your caregiver to adjust your dose of warfarin. The dose can change for many reasons. It is critically important that you take warfarin exactly as prescribed, and that you have your PT and INR levels drawn exactly as directed.  Many foods, especially foods high in vitamin K can interfere with warfarin and affect the PT and INR results. Foods high in vitamin K include spinach, kale, broccoli, cabbage, collard and turnip greens, brussels sprouts, peas, cauliflower, seaweed, and parsley as well as beef and pork liver, green tea, and soybean oil. You should eat a consistent amount of foods  high in vitamin K. Avoid major changes in your diet, or notify your caregiver before changing your diet. Arrange a visit with a dietitian to answer your questions.  Many medicines can interfere with warfarin and affect the PT and INR results. You must tell your caregiver about any and all medicines you take, this includes all vitamins and supplements. Be especially cautious with aspirin and anti-inflammatory medicines. Do not take or discontinue any prescribed or over-the-counter medicine except on the advice of your caregiver or pharmacist.  Warfarin can have side effects, such as excessive bruising or bleeding. You will need to hold pressure over cuts for longer than usual. Your caregiver or pharmacist will discuss other potential side effects.  Avoid sports or activities that may cause injury or bleeding.  Be mindful when shaving, flossing your teeth, or handling sharp objects.  Alcohol can change the body's ability to handle warfarin. It is best to avoid alcoholic drinks or consume only very small amounts while taking warfarin. Notify your caregiver if you change your alcohol intake.  Notify your dentist or other caregivers before procedures.  If swallow studies have determined that your swallowing reflex is present, you should eat healthy foods. A diet that includes 5 or more servings of fruits and vegetables a day may reduce the risk of stroke. Foods may need to be a special consistency (soft or pureed), or small bites may need to be taken  in order to avoid aspirating or choking. Certain diets may be prescribed to address high blood pressure, high cholesterol, diabetes, or obesity.  A low-sodium, low-saturated fat, low-trans fat, low-cholesterol diet is recommended to manage high blood pressure.  A low-saturated fat, low-trans fat, low-cholesterol, and high-fiber diet may control cholesterol levels.  A controlled-carbohydrate, controlled-sugar diet is recommended to manage diabetes.  A  reduced-calorie, low-sodium, low-saturated fat, low-trans fat, low-cholesterol diet is recommended to manage obesity.  Maintain a healthy weight.  Stay physically active. It is recommended that you get at least 30 minutes of activity on most or all days.  Do not smoke.  Limit alcohol use even if you are not taking warfarin. Moderate alcohol use is considered to be:  No more than 2 drinks each day for men.  No more than 1 drink each day for nonpregnant women.  Stop drug abuse.  Home safety. A safe home environment is important to reduce the risk of falls. Your caregiver may arrange for specialists to evaluate your home. Having grab bars in the bedroom and bathroom is often important. Your caregiver may arrange for equipment to be used at home, such as raised toilets and a seat for the shower.  Physical, occupational, and speech therapy. Ongoing therapy may be needed to maximize your recovery after a stroke. If you have been advised to use a walker or a cane, use it at all times. Be sure to keep your therapy appointments.  Follow all instructions for follow-up with your caregiver. This is very important. This includes any referrals, physical therapy, rehabilitation, and lab tests. Proper follow up can prevent another stroke from occurring. SEEK MEDICAL CARE IF:  You have personality changes.  You have difficulty swallowing.  You are seeing double.  You have dizziness.  You have a fever.  You have skin breakdown. SEEK IMMEDIATE MEDICAL CARE IF:  Any of these symptoms may represent a serious problem that is an emergency. Do not wait to see if the symptoms will go away. Get medical help right away. Call your local emergency services (911 in [place] Do not drive yourself to the hospital.  You have sudden weakness or numbness of the face, arm, or leg, especially on one side of the body.  You have sudden trouble walking or difficulty moving arms or legs.  You have sudden  confusion.  You have trouble speaking (aphasia) or understanding.  You have sudden trouble seeing in one or both eyes.  You have a loss of balance or coordination.  You have a sudden, severe headache with no known cause.  You have new chest pain or an irregular heartbeat.  You have a partial or total loss of consciousness.   Document Released: 09/24/2005 Document Revised: 05/27/2013 Document Reviewed: 05/04/2012 [facility] Patient Information 2014 [place].

## 2014-03-18 NOTE — Care Management Note (Signed)
    Page 1 of 1   03/18/2014     3:02:45 PM CARE MANAGEMENT NOTE 03/18/2014  Patient:  ASNA, MULDROW   Account Number:  [id number]  Date Initiated:  03/18/2014  Documentation initiated by:  Lorne Skeens  Subjective/Objective Assessment:   Patient was admitted with CVA. Lives at home alone with good family support.     Action/Plan:   Will follow for discharge needs pending PT/OT evals and physician orders.   Anticipated DC Date:  03/18/2014   Anticipated DC Plan:  HOME/SELF CARE      DC Planning Services  CM consult      Choice offered to / List presented to:  C-1 Patient           Status of service:  Completed, signed off Medicare Important Message given?  NA - LOS <3 / Initial given by admissions (If response is "NO", the following Medicare IM given date fields will be blank) Date Medicare IM given:  03/17/2014 Date Additional Medicare IM given:    Discharge Disposition:  HOME/SELF CARE  Per UR Regulation:  Reviewed for med. necessity/level of care/duration of stay  If discussed at [place] of Stay Meetings, dates discussed:    Comments:  03/18/14 [place], MSN, CM- Met with patient and family to discuss outpatient speech therapy.  Patient is declining services at this time and family is in agreement.  Patient was encouraged to contact her PCP should any needs arise after discharge. RN updated.

## 2014-03-18 NOTE — Evaluation (Signed)
Speech Language Pathology Evaluation Patient Details Name: Shirley Sanders MRN: [id number] DOB: [birth year] Today's Date: 03/18/2014 Time: [id number] SLP Time Calculation (min): 24 min  Problem List:  Patient Active Problem List   Diagnosis Date Noted  . CVA (cerebral infarction) 03/17/2014  . Acute cerebral infarction 03/17/2014  . Hyperlipidemia 03/17/2014  . Hypertension 03/17/2014  . Diabetes mellitus, type 2 03/17/2014   Past Medical History:  Past Medical History  Diagnosis Date  . Hypertension   . Diabetes mellitus without complication   . Renal disorder   . Stroke     left sided sensory and motor deficits   Past Surgical History:  Past Surgical History  Procedure Laterality Date  . Tonsillectomy    . Abdominal hysterectomy     HPI:  79 y/o female with PMH of CVA (2003), DM, HTN who presents with perioral numbness, R UE "heaviness" and slurred speech. Patient states she was feeling well tilll approx 1030 AM yesterday morning when she developed R sided lip numbness after which she was noted to have slurred speech by a coworker. MRI revealed punctate acute infarction affecting right posterior frontal gyrus     Assessment / Plan / Recommendation Clinical Impression  Patient exhibits decreased sustained and selective attention, complex problem solving, recall (retrieval) of information, and mental flexibility which overall may impact her ability to perform more complex tasks in her work and home life. Patient reports feeling like she is at her baseline and does have a h/o stroke, but denies needing SLP services previously. SLP provided education to the patient and her family regarding recommendations for OP SLP f/u to address higher level cognition along with intermittent supervision upon d/c home, particularly with higher level tasks such as medication/money management.     SLP Assessment  All further Speech Lanaguage Pathology  needs can be addressed in the next venue of care     Follow Up Recommendations  Outpatient SLP;Other (comment) (intermittent supervision)    Frequency and Duration        Pertinent Vitals/Pain N/A   SLP Goals     SLP Evaluation Prior Functioning  Cognitive/Linguistic Baseline: Within functional limits Type of Home: House  Lives With: Alone Available Help at Discharge: Family;Available PRN/intermittently Vocation: Full time employment   Cognition  Overall Cognitive Status: Impaired/Different from baseline Arousal/Alertness: Awake/alert Orientation Level: Oriented X4 Attention: Sustained;Selective;Focused Focused Attention: Appears intact Sustained Attention: Impaired Sustained Attention Impairment: Verbal complex;Functional complex Selective Attention: Impaired Selective Attention Impairment: Verbal complex;Functional complex Memory: Impaired Memory Impairment: Retrieval deficit Awareness: Impaired Awareness Impairment: Anticipatory impairment Problem Solving: Impaired Problem Solving Impairment: Verbal complex;Functional complex Executive Function: Reasoning Reasoning: Impaired Reasoning Impairment: Verbal complex;Functional complex Safety/Judgment: Appears intact Comments: patient with decreased mental flexibility    Comprehension  Auditory Comprehension Overall Auditory Comprehension: Appears within functional limits for tasks assessed Visual Recognition/Discrimination Discrimination: Not tested Reading Comprehension Reading Status: Not tested    Expression Expression Primary Mode of Expression: Verbal Verbal Expression Overall Verbal Expression: Appears within functional limits for tasks assessed Written Expression Dominant Hand: Right Written Expression: Not tested   Oral / Motor Motor Speech Overall Motor Speech: Appears within functional limits for tasks assessed   GO      Germain Osgood, M.A. CCC-SLP [phone]  Germain Osgood 03/18/2014, 12:13 PM

## 2014-03-24 ENCOUNTER — Ambulatory Visit (INDEPENDENT_AMBULATORY_CARE_PROVIDER_SITE_OTHER): Payer: Medicare Other | Admitting: Podiatry

## 2014-03-24 ENCOUNTER — Ambulatory Visit (INDEPENDENT_AMBULATORY_CARE_PROVIDER_SITE_OTHER): Payer: Medicare Other

## 2014-03-24 ENCOUNTER — Encounter: Payer: Self-pay | Admitting: Podiatry

## 2014-03-24 VITALS — BP 120/68 | HR 92 | Resp 16 | Ht 61.0 in | Wt 137.0 lb

## 2014-03-24 DIAGNOSIS — M779 Enthesopathy, unspecified: Secondary | ICD-10-CM

## 2014-03-24 DIAGNOSIS — E119 Type 2 diabetes mellitus without complications: Secondary | ICD-10-CM

## 2014-03-24 DIAGNOSIS — M778 Other enthesopathies, not elsewhere classified: Secondary | ICD-10-CM

## 2014-03-24 DIAGNOSIS — M7750 Other enthesopathy of unspecified foot: Secondary | ICD-10-CM

## 2014-03-24 NOTE — Progress Notes (Signed)
She presents today complaining of pain to the third metatarsal phalangeal joint area of the right foot. She has previously had a fracture to the second metatarsal which elevated.  Objective: Vital signs are stable she is alert and oriented x3 pulses to the right foot are palpable. Elevated second metatarsal resulting in a plantar flexed third metatarsal with pain on palpation to the third metatarsophalangeal joint of the right foot. Radiographic evaluation demonstrates no other osseous abnormalities are noted. She has pain on end range of motion of the third metatarsophalangeal joint.  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  Plan: Injected a round third metatarsophalangeal joint today with Kenalog and local anesthetic. Discussed appropriate shoe gear stretching exercises ice therapy. Discussed elevation.

## 2014-05-03 ENCOUNTER — Ambulatory Visit (INDEPENDENT_AMBULATORY_CARE_PROVIDER_SITE_OTHER): Payer: Medicare Other | Admitting: Neurology

## 2014-05-03 ENCOUNTER — Encounter: Payer: Self-pay | Admitting: Neurology

## 2014-05-03 VITALS — BP 104/61 | HR 77 | Ht 60.0 in | Wt 137.6 lb

## 2014-05-03 DIAGNOSIS — E119 Type 2 diabetes mellitus without complications: Secondary | ICD-10-CM

## 2014-05-03 DIAGNOSIS — E785 Hyperlipidemia, unspecified: Secondary | ICD-10-CM

## 2014-05-03 DIAGNOSIS — I6350 Cerebral infarction due to unspecified occlusion or stenosis of unspecified cerebral artery: Secondary | ICD-10-CM

## 2014-05-03 DIAGNOSIS — I10 Essential (primary) hypertension: Secondary | ICD-10-CM

## 2014-05-03 DIAGNOSIS — I639 Cerebral infarction, unspecified: Secondary | ICD-10-CM

## 2014-05-03 NOTE — Progress Notes (Signed)
STROKE NEUROLOGY FOLLOW UP NOTE  NAME: Shirley Sanders DOB: March 28, 1935  REASON FOR VISIT:  HISTORY FROM:   Today we had the pleasure of seeing Shirley Sanders in follow-up at our [facility]rology [facility]. Pt was accompanied by niece.   History Summary Shirley Sanders is an 79 y.o. female with prior history HTN, HLD, DM, previous R hemisphere CVA leaving her with residual left arm weakness and Left leg decreased sensation. On 03/17/14 she developed sudden onset left perioral numbness and tingling and numbness from her hand up spreading to her mid arm with some questionable weakness of the RUE and slurry speech. She went to her PCP where they gave her ASA and called EMS. On arrival to ER patient continued to have RUE numbness and some questionable weakness but slurry speech resolved. Initial CT head was negative. tPA was not given due to minimal symptoms. Patient endorses inconsistent adherence to ASA therapy. MRI also did not show acute stroke on the left hemisphere but show punctate right posterior frontal gyrus acute infarct. Her symptoms resolved 2nd day. She was continued on ASA 81 mg and changed from zocor to crestor 40mg  for high LDL at 149.  Interval History During the interval time, the patient has been doing well.  She still has sometimes LUE numbness tingling, and LLE sometime heavier than other time. RUE no residue deficit and no more perioral numbness.  She has hx of migraine headache, once every 1-2 weeks, right head predominant, not too bad at al and tylenol helps lasting several hours, no photo phonoa no N/V, no aura. When she had above symptoms, she denies any headache. She takes crestor daily and no side effect. Her recent LDL check was 39, down from 149 in June. She only complains of fatigue, she stated that she was not as active as before and she felt tired in the afternoon. She used to work all day and now she is able to work only half a day.   REVIEW OF SYSTEMS: Full 14 system review of  systems performed and notable only for those listed below and in HPI above, all others are negative:  Constitutional: fatigue Cardiovascular: N/A  Ear/Nose/Throat: N/A  Skin: N/A  Eyes: vision loss, macular degeneration Respiratory: N/A  Gastroitestinal: N/A  Hematology/Lymphatic: N/A  Endocrine: N/A  Musculoskeletal: N/A  Allergy/Immunology: allergy, running nose Neurological: N/A  Psychiatric: insomnia and decreased energy  The following represents the patient's updated allergies and side effects list: Allergies  Allergen Reactions  . Codeine Other (See Comments)    hbp  . Contrast Media [Iodinated Diagnostic Agents]   . Fish Allergy   . Milk-Related Compounds     Labs since last visit of relevance include the following: Results for orders placed during the hospital encounter of 03/17/14  ETHANOL      Result Value Ref Range   Alcohol, Ethyl (B) <11  0 - 11 mg/dL  PROTIME-INR      Result Value Ref Range   Prothrombin Time 12.9  11.6 - 15.2 seconds   INR 0.99  0.00 - 1.49  APTT      Result Value Ref Range   aPTT 28  24 - 37 seconds  CBC      Result Value Ref Range   WBC 7.0  4.0 - 10.5 K/uL   RBC 4.51  3.87 - 5.11 MIL/uL   Hemoglobin 12.8  12.0 - 15.0 g/dL   HCT 39.6  36.0 - 46.0 %   MCV 87.8  78.0 - 100.0 fL   MCH 28.4  26.0 - 34.0 pg   MCHC 32.3  30.0 - 36.0 g/dL   RDW 13.3  11.5 - 15.5 %   Platelets 280  150 - 400 K/uL  DIFFERENTIAL      Result Value Ref Range   Neutrophils Relative % 72  43 - 77 %   Neutro Abs 5.0  1.7 - 7.7 K/uL   Lymphocytes Relative 17  12 - 46 %   Lymphs Abs 1.2  0.7 - 4.0 K/uL   Monocytes Relative 7  3 - 12 %   Monocytes Absolute 0.5  0.1 - 1.0 K/uL   Eosinophils Relative 4  0 - 5 %   Eosinophils Absolute 0.3  0.0 - 0.7 K/uL   Basophils Relative 0  0 - 1 %   Basophils Absolute 0.0  0.0 - 0.1 K/uL  COMPREHENSIVE METABOLIC PANEL      Result Value Ref Range   Sodium 142  137 - 147 mEq/L   Potassium 4.4  3.7 - 5.3 mEq/L   Chloride  100  96 - 112 mEq/L   CO2 26  19 - 32 mEq/L   Glucose, Bld 110 (*) 70 - 99 mg/dL   BUN 19  6 - 23 mg/dL   Creatinine, Ser 1.04  0.50 - 1.10 mg/dL   Calcium 9.8  8.4 - 10.5 mg/dL   Total Protein 7.6  6.0 - 8.3 g/dL   Albumin 3.7  3.5 - 5.2 g/dL   AST 12  0 - 37 U/L   ALT 9  0 - 35 U/L   Alkaline Phosphatase 96  39 - 117 U/L   Total Bilirubin <0.2 (*) 0.3 - 1.2 mg/dL   GFR calc non Af Amer 51 (*) >90 mL/min   GFR calc Af Amer 59 (*) >90 mL/min  URINE RAPID DRUG SCREEN (HOSP PERFORMED)      Result Value Ref Range   Opiates NONE DETECTED  NONE DETECTED   Cocaine NONE DETECTED  NONE DETECTED   Benzodiazepines NONE DETECTED  NONE DETECTED   Amphetamines NONE DETECTED  NONE DETECTED   Tetrahydrocannabinol NONE DETECTED  NONE DETECTED   Barbiturates NONE DETECTED  NONE DETECTED  URINALYSIS, ROUTINE W REFLEX MICROSCOPIC      Result Value Ref Range   Color, Urine STRAW (*) YELLOW   APPearance CLEAR  CLEAR   Specific Gravity, Urine 1.008  1.005 - 1.030   pH 6.0  5.0 - 8.0   Glucose, UA NEGATIVE  NEGATIVE mg/dL   Hgb urine dipstick NEGATIVE  NEGATIVE   Bilirubin Urine NEGATIVE  NEGATIVE   Ketones, ur NEGATIVE  NEGATIVE mg/dL   Protein, ur NEGATIVE  NEGATIVE mg/dL   Urobilinogen, UA 0.2  0.0 - 1.0 mg/dL   Nitrite NEGATIVE  NEGATIVE   Leukocytes, UA SMALL (*) NEGATIVE  URINE MICROSCOPIC-ADD ON      Result Value Ref Range   Squamous Epithelial / LPF RARE  RARE   WBC, UA 7-10  <3 WBC/hpf   RBC / HPF 0-2  <3 RBC/hpf   Bacteria, UA FEW (*) RARE  HEMOGLOBIN A1C      Result Value Ref Range   Hemoglobin A1C 6.4 (*) <5.7 %   Mean Plasma Glucose 137 (*) <117 mg/dL  LIPID PANEL      Result Value Ref Range   Cholesterol 222 (*) 0 - 200 mg/dL   Triglycerides 165 (*) <150 mg/dL   HDL 50  >39 mg/dL  Total CHOL/HDL Ratio 4.4     VLDL 33  0 - 40 mg/dL   LDL Cholesterol 139 (*) 0 - 99 mg/dL  GLUCOSE, CAPILLARY      Result Value Ref Range   Glucose-Capillary 112 (*) 70 - 99 mg/dL  GLUCOSE,  CAPILLARY      Result Value Ref Range   Glucose-Capillary 125 (*) 70 - 99 mg/dL   Comment 1 Documented in Chart     Comment 2 Notify RN    GLUCOSE, CAPILLARY      Result Value Ref Range   Glucose-Capillary 113 (*) 70 - 99 mg/dL   Comment 1 Documented in Chart     Comment 2 Notify RN    I-STAT CHEM 8, ED      Result Value Ref Range   Sodium 139  137 - 147 mEq/L   Potassium 4.0  3.7 - 5.3 mEq/L   Chloride 104  96 - 112 mEq/L   BUN 19  6 - 23 mg/dL   Creatinine, Ser 1.00  0.50 - 1.10 mg/dL   Glucose, Bld 112 (*) 70 - 99 mg/dL   Calcium, Ion 1.13  1.13 - 1.30 mmol/L   TCO2 22  0 - 100 mmol/L   Hemoglobin 14.3  12.0 - 15.0 g/dL   HCT 42.0  36.0 - 46.0 %  I-STAT TROPOININ, ED      Result Value Ref Range   Troponin i, poc 0.00  0.00 - 0.08 ng/mL   Comment 3             The neurologically relevant items on the patient's problem list were reviewed on today's visit.  Neurologic Examination  A problem focused neurological exam (12 or more points of the single system neurologic examination, vital signs counts as 1 point, cranial nerves count for 8 points) was performed.  Blood pressure 104/61, pulse 77, height 5' (1.524 m), weight 137 lb 9.6 oz (62.415 kg).  General - Well nourished, well developed, in no apparent distress.  Ophthalmologic - Sharp disc margins OU.  Cardiovascular - Regular rate and rhythm with no murmur.  Mental Status -  Level of arousal and orientation to time, place, and person were intact. Language including expression, naming, repetition, comprehension was assessed and found intact.  Cranial Nerves II - XII - II - Vision intact OU. III, IV, VI - Extraocular movements intact. V - Facial sensation intact bilaterally. VII - Facial movement intact bilaterally. VIII - Hearing & vestibular intact bilaterally. X - Palate elevates symmetrically. XI - Chin turning & shoulder shrug intact bilaterally. XII - Tongue protrusion intact.  Motor Strength - The  patient's strength was normal in all extremities and pronator drift was absent.  Bulk was normal and fasciculations were absent.   Motor Tone - Muscle tone was assessed at the neck and appendages and was normal.  Reflexes - The patient's reflexes were normal in all extremities and she had no pathological reflexes.  Sensory - Light touch, temperature/pinprick were assessed and were intact except mildly decreased on the left UE.    Coordination - The patient had normal movements in the hands and feet with no ataxia or dysmetria.  Tremor was absent.  Gait and Station - The patient's transfers, posture, gait, station, and turns were observed as normal.  Data reviewed: I personally reviewed the images and agree with the radiology interpretations.  Dg Chest 2 View  03/17/2014 IMPRESSION: No active cardiopulmonary process.  Ct Head Wo Contrast  03/17/2014 IMPRESSION:  Atrophy and moderate chronic microvascular ischemic changes. No acute abnormality.  Mr Brain Wo Contrast  03/17/2014 IMPRESSION: Punctate acute infarction affecting a right posterior frontal gyrus at the vertex. Chronic ischemic changes elsewhere throughout the brain as outlined above.  Mr Jodene Nam Head/brain Wo Cm  03/18/2014 IMPRESSION: No medium or large size vessel significant stenosis or occlusion. Branch vessel mild irregularity consistent with atherosclerotic type changes.  2D Echocardiogram  Left ventricle: The cavity size was normal. Systolic function was normal. The estimated ejection fraction was in the range of 55% to 60%. Wall motion was normal; there were no regional wall motion abnormalities. There was an increased relative contribution of atrial contraction to ventricular filling. Doppler parameters are consistent with abnormal left ventricular relaxation (grade 1 diastolic dysfunction). - Pulmonic valve: There was trivial regurgitation.  Carotid Doppler 03/18/14 <39% stenosis bilat  A1C 6.4 and LDL 139  Assessment: As  you may recall, she is a 79 y.o. Caucasian female with hx of HTN, DM, HLD, previous R hemisphere CVA with residual left arm weakness and Left leg decreased sensation was admitted in 03/2014 for acute onset left perioral numbness and right arm numbness with slurry speech and questionable right arm weakness. CT neg and MRI only showed right frontal punctate infarct. Symptoms resolved 2nd day. Her acute stroke on MRI was at right side and her main symptoms was at right side but she also has left perioral numbness. The entire presentation may be complicated migraine without headache given her hx of migraine but can not be certain. Although her presentation does not fit to stroke very well however she does have stroke risk factors and previous stroke. We will still focus on the stroke prevention.  Plan:  - continue ASA and crestor for stroke prevention. LDL responds well to crestor - for her fatigue, like to refer to PT but pt would like to think about it and let me know - continue to follow up with PCP for stroke risk factor control - RTC in 3-4 months.  Diagnoses from this visit: No diagnosis found.  No orders of the defined types were placed in this encounter.   No orders of the defined types were placed in this encounter.   Patient Instructions  1. Continue the ASA and crestor, for stroke prevention 2. Need to follow up with your PCP for risk factor control, HTN, DM and HLD. 3. Recommend out pt physical therapy referral, and please think about it. 4. Monitor you glucose level and BP at home 5. Follow up in about 3-4 month   Rosalin Hawking, MD PhD [facility] [address] [place], [place] [address] [phone]

## 2014-05-03 NOTE — Patient Instructions (Signed)
1. Continue the ASA and crestor, for stroke prevention 2. Need to follow up with your PCP for risk factor control, HTN, DM and HLD. 3. Recommend out pt physical therapy referral, and please think about it. 4. Monitor you glucose level and BP at home 5. Follow up in about 3-4 month

## 2014-07-19 ENCOUNTER — Telehealth: Payer: Self-pay | Admitting: *Deleted

## 2014-07-19 NOTE — Telephone Encounter (Signed)
Patient's daughter calling to cancel patient's appointment, states that patient was diagnosed with End Stage Renal disease, states that they will call back if they fill an appointment is needed.

## 2014-08-25 ENCOUNTER — Ambulatory Visit: Payer: Medicare Other | Admitting: Neurology

## 2014-10-11 DIAGNOSIS — E876 Hypokalemia: Secondary | ICD-10-CM | POA: Diagnosis not present

## 2014-10-21 ENCOUNTER — Encounter: Payer: Self-pay | Admitting: *Deleted

## 2014-10-22 ENCOUNTER — Encounter: Payer: Self-pay | Admitting: Internal Medicine

## 2014-10-22 ENCOUNTER — Ambulatory Visit (INDEPENDENT_AMBULATORY_CARE_PROVIDER_SITE_OTHER): Payer: Medicare Other | Admitting: Internal Medicine

## 2014-10-22 VITALS — BP 128/80 | HR 92 | Temp 98.3°F | Resp 10 | Ht 61.0 in | Wt 122.0 lb

## 2014-10-22 DIAGNOSIS — I6930 Unspecified sequelae of cerebral infarction: Secondary | ICD-10-CM | POA: Diagnosis not present

## 2014-10-22 DIAGNOSIS — N189 Chronic kidney disease, unspecified: Secondary | ICD-10-CM | POA: Diagnosis not present

## 2014-10-22 DIAGNOSIS — E1122 Type 2 diabetes mellitus with diabetic chronic kidney disease: Secondary | ICD-10-CM

## 2014-10-22 DIAGNOSIS — I10 Essential (primary) hypertension: Secondary | ICD-10-CM

## 2014-10-22 DIAGNOSIS — H3530 Unspecified macular degeneration: Secondary | ICD-10-CM | POA: Insufficient documentation

## 2014-10-22 DIAGNOSIS — N184 Chronic kidney disease, stage 4 (severe): Secondary | ICD-10-CM | POA: Insufficient documentation

## 2014-10-22 DIAGNOSIS — E785 Hyperlipidemia, unspecified: Secondary | ICD-10-CM

## 2014-10-22 DIAGNOSIS — D631 Anemia in chronic kidney disease: Secondary | ICD-10-CM | POA: Insufficient documentation

## 2014-10-22 NOTE — Progress Notes (Signed)
Patient ID: Shirley Sanders, female   DOB: 10/17/1934, 80 y.o.   MRN: [id number]    Facility  [facility]    Place of Service:   OFFICE   Allergies  Allergen Reactions  . Codeine Other (See Comments)    hbp  . Contrast Media [Iodinated Diagnostic Agents]   . Fish Allergy   . Milk-Related Compounds     Chief Complaint  Patient presents with  . Establish Care    New patient establis care, fatigue since stroke in June 2015     HPI:  80 yo female seen today as a new patient. She has a hx CVA with left arm hemiparesis/paresthesias and LE "heavy" sensation. Her last CVA presented similar to first with exception of right arm tingling. RUE tingling resolved. She is c/a feeling extremely tired since her stroke.   Home BP 120-150s/80-90s. CBGs 120-130s and occasionally 150-200s. No low BS reactions. Paresthesias as noted above. No meds for DM since oct 9th. Previously on metformin but stopped due to worsening renal fxn. She sees nephro Dr Joelyn Oms.   She has macular degeneration and sees Dr Milus Height at [facility]. She receives injections into OD to preserve vision but is blind in OS.  She has a hx anemia due to CKD  Medications: Patient's Medications  New Prescriptions   No medications on file  Previous Medications   AMLODIPINE BESYLATE PO    Take 5 mg by mouth at bedtime.   ASPIRIN EC 81 MG TABLET    Take 1 tablet (81 mg total) by mouth daily.   CETIRIZINE (ZYRTEC) 10 MG TABLET    Take 10 mg by mouth daily.   CHLORTHALIDONE PO    Take 12.5 mg by mouth every morning.   CHOLECALCIFEROL (VITAMIN D3) 3000 UNITS TABS    Take 1 tablet by mouth daily.   MULTIPLE VITAMINS-MINERALS (PRESERVISION AREDS) CAPS    Take 2 capsules by mouth 2 (two) times daily.   POTASSIUM CHLORIDE SA (K-DUR,KLOR-CON) 20 MEQ TABLET    Take 20 mEq by mouth daily.   PYRIDOXINE HCL (B-6 PO)    Take 100 mg by mouth daily.  Modified Medications   No medications on file  Discontinued Medications   CARVEDILOL  (COREG) 12.5 MG TABLET    Take 25 mg by mouth 2 (two) times daily with a meal.   GLIMEPIRIDE (AMARYL) 2 MG TABLET    Take 1 mg by mouth daily with breakfast.    HYDROCHLOROTHIAZIDE (HYDRODIURIL) 25 MG TABLET    Take 25 mg by mouth daily.   LISINOPRIL (PRINIVIL,ZESTRIL) 40 MG TABLET    Take 40 mg by mouth daily.   METFORMIN (GLUCOPHAGE) 500 MG TABLET    Take 500 mg by mouth 2 (two) times daily with a meal.   ROSUVASTATIN (CRESTOR) 40 MG TABLET    Take 1 tablet (40 mg total) by mouth daily.     Review of Systems  As above. All other systems reviewed are negative.  Filed Vitals:   10/22/14 1326  BP: 128/80  Pulse: 92  Temp: 98.3 F (36.8 C)  TempSrc: Oral  Resp: 10  Height: 5' 1"  (1.549 m)  Weight: 122 lb (55.339 kg)  SpO2: 97%   Body mass index is 23.06 kg/(m^2).  Physical Exam CONSTITUTIONAL: Looks well in NAD. Awake, alert and oriented x 3 HEENT: PERRLA. EOMI. Oropharynx clear and without exudate. MMM NECK: Supple. Nontender. No palpable cervical or supraclavicular lymph nodes. No carotid bruit b/l. No thyromegaly  or thyroid mass palpable.  CVS: Regular rate without murmur, gallop or rub. LUNGS: CTA b/l no wheezing, rales or rhonchi. ABDOMEN: Bowel sounds present. Soft, nontender, nondistended. No palpable mass or bruit EXTREMITIES: No edema b/l. Distal pulses palpable. No calf tenderness PSYCH: Affect, behavior and mood normal NEURO: CN 2-12 grossly intact. Strength 4/5 in LUE and 3/5 in LLE. Poor grip strength.  SKIN: purpura noted L>R dorsal hand.   Labs reviewed: No visits with results within 3 Month(s) from this visit. Latest known visit with results is:  Admission on 03/17/2014, Discharged on 03/18/2014  Component Date Value Ref Range Status  . Alcohol, Ethyl (B) 03/17/2014 <11  0 - 11 mg/dL Final   Comment:                                 LOWEST DETECTABLE LIMIT FOR                          SERUM ALCOHOL IS 11 mg/dL                          FOR MEDICAL  PURPOSES ONLY  . Sodium 03/17/2014 139  137 - 147 mEq/L Final  . Potassium 03/17/2014 4.0  3.7 - 5.3 mEq/L Final  . Chloride 03/17/2014 104  96 - 112 mEq/L Final  . BUN 03/17/2014 19  6 - 23 mg/dL Final  . Creatinine, Ser 03/17/2014 1.00  0.50 - 1.10 mg/dL Final  . Glucose, Bld 03/17/2014 112* 70 - 99 mg/dL Final  . Calcium, Ion 03/17/2014 1.13  1.13 - 1.30 mmol/L Final  . TCO2 03/17/2014 22  0 - 100 mmol/L Final  . Hemoglobin 03/17/2014 14.3  12.0 - 15.0 g/dL Final  . HCT 03/17/2014 42.0  36.0 - 46.0 % Final  . Troponin i, poc 03/17/2014 0.00  0.00 - 0.08 ng/mL Final  . Comment 3 03/17/2014          Final   Comment: Due to the release kinetics of cTnI,                          a negative result within the first hours                          of the onset of symptoms does not rule out                          myocardial infarction with certainty.                          If myocardial infarction is still suspected,                          repeat the test at appropriate intervals.  . Prothrombin Time 03/17/2014 12.9  11.6 - 15.2 seconds Final  . INR 03/17/2014 0.99  0.00 - 1.49 Final  . aPTT 03/17/2014 28  24 - 37 seconds Final  . WBC 03/17/2014 7.0  4.0 - 10.5 K/uL Final  . RBC 03/17/2014 4.51  3.87 - 5.11 MIL/uL Final  . Hemoglobin 03/17/2014 12.8  12.0 - 15.0 g/dL Final  . HCT 03/17/2014 39.6  36.0 - 46.0 %  Final  . MCV 03/17/2014 87.8  78.0 - 100.0 fL Final  . MCH 03/17/2014 28.4  26.0 - 34.0 pg Final  . MCHC 03/17/2014 32.3  30.0 - 36.0 g/dL Final  . RDW 03/17/2014 13.3  11.5 - 15.5 % Final  . Platelets 03/17/2014 280  150 - 400 K/uL Final  . Neutrophils Relative % 03/17/2014 72  43 - 77 % Final  . Neutro Abs 03/17/2014 5.0  1.7 - 7.7 K/uL Final  . Lymphocytes Relative 03/17/2014 17  12 - 46 % Final  . Lymphs Abs 03/17/2014 1.2  0.7 - 4.0 K/uL Final  . Monocytes Relative 03/17/2014 7  3 - 12 % Final  . Monocytes Absolute 03/17/2014 0.5  0.1 - 1.0 K/uL Final  . Eosinophils  Relative 03/17/2014 4  0 - 5 % Final  . Eosinophils Absolute 03/17/2014 0.3  0.0 - 0.7 K/uL Final  . Basophils Relative 03/17/2014 0  0 - 1 % Final  . Basophils Absolute 03/17/2014 0.0  0.0 - 0.1 K/uL Final  . Sodium 03/17/2014 142  137 - 147 mEq/L Final  . Potassium 03/17/2014 4.4  3.7 - 5.3 mEq/L Final  . Chloride 03/17/2014 100  96 - 112 mEq/L Final  . CO2 03/17/2014 26  19 - 32 mEq/L Final  . Glucose, Bld 03/17/2014 110* 70 - 99 mg/dL Final  . BUN 03/17/2014 19  6 - 23 mg/dL Final  . Creatinine, Ser 03/17/2014 1.04  0.50 - 1.10 mg/dL Final  . Calcium 03/17/2014 9.8  8.4 - 10.5 mg/dL Final  . Total Protein 03/17/2014 7.6  6.0 - 8.3 g/dL Final  . Albumin 03/17/2014 3.7  3.5 - 5.2 g/dL Final  . AST 03/17/2014 12  0 - 37 U/L Final  . ALT 03/17/2014 9  0 - 35 U/L Final  . Alkaline Phosphatase 03/17/2014 96  39 - 117 U/L Final  . Total Bilirubin 03/17/2014 <0.2* 0.3 - 1.2 mg/dL Final  . GFR calc non Af Amer 03/17/2014 51* >90 mL/min Final  . GFR calc Af Amer 03/17/2014 59* >90 mL/min Final   Comment: (NOTE)                          The eGFR has been calculated using the CKD EPI equation.                          This calculation has not been validated in all clinical situations.                          eGFR's persistently <90 mL/min signify possible Chronic Kidney                          Disease.  Marland Kitchen Opiates 03/17/2014 NONE DETECTED  NONE DETECTED Final  . Cocaine 03/17/2014 NONE DETECTED  NONE DETECTED Final  . Benzodiazepines 03/17/2014 NONE DETECTED  NONE DETECTED Final  . Amphetamines 03/17/2014 NONE DETECTED  NONE DETECTED Final  . Tetrahydrocannabinol 03/17/2014 NONE DETECTED  NONE DETECTED Final  . Barbiturates 03/17/2014 NONE DETECTED  NONE DETECTED Final   Comment:                                 DRUG SCREEN FOR MEDICAL PURPOSES  ONLY.  IF CONFIRMATION IS NEEDED                          FOR ANY PURPOSE, NOTIFY LAB                          WITHIN 5  DAYS.                                                          LOWEST DETECTABLE LIMITS                          FOR URINE DRUG SCREEN                          Drug Class       Cutoff (ng/mL)                          Amphetamine      1000                          Barbiturate      200                          Benzodiazepine   200                          Tricyclics       741                          Opiates          300                          Cocaine          300                          THC              50  . Color, Urine 03/17/2014 STRAW* YELLOW Final  . APPearance 03/17/2014 CLEAR  CLEAR Final  . Specific Gravity, Urine 03/17/2014 1.008  1.005 - 1.030 Final  . pH 03/17/2014 6.0  5.0 - 8.0 Final  . Glucose, UA 03/17/2014 NEGATIVE  NEGATIVE mg/dL Final  . Hgb urine dipstick 03/17/2014 NEGATIVE  NEGATIVE Final  . Bilirubin Urine 03/17/2014 NEGATIVE  NEGATIVE Final  . Ketones, ur 03/17/2014 NEGATIVE  NEGATIVE mg/dL Final  . Protein, ur 03/17/2014 NEGATIVE  NEGATIVE mg/dL Final  . Urobilinogen, UA 03/17/2014 0.2  0.0 - 1.0 mg/dL Final  . Nitrite 03/17/2014 NEGATIVE  NEGATIVE Final  . Leukocytes, UA 03/17/2014 SMALL* NEGATIVE Final  . Squamous Epithelial / LPF 03/17/2014 RARE  RARE Final  . WBC, UA 03/17/2014 7-10  <3 WBC/hpf Final  . RBC / HPF 03/17/2014 0-2  <3 RBC/hpf Final  . Bacteria, UA 03/17/2014 FEW* RARE Final  . Hgb A1c MFr Bld 03/18/2014 6.4* <5.7 % Final   Comment: (NOTE)  According to the ADA Clinical Practice Recommendations for 2011, when                          HbA1c is used as a screening test:                           >=6.5%   Diagnostic of Diabetes Mellitus                                    (if abnormal result is confirmed)                          5.7-6.4%   Increased risk of developing Diabetes Mellitus                           References:Diagnosis and Classification of Diabetes Mellitus,Diabetes                          NWGN,5621,30(QMVHQ 1):S62-S69 and Standards of Medical Care in                                  Diabetes - 2011,Diabetes IONG,2952,84 (Suppl 1):S11-S61.  . Mean Plasma Glucose 03/18/2014 137* <117 mg/dL Final   Performed at [facility]  . Cholesterol 03/18/2014 222* 0 - 200 mg/dL Final  . Triglycerides 03/18/2014 165* <150 mg/dL Final  . HDL 03/18/2014 50  >39 mg/dL Final  . Total CHOL/HDL Ratio 03/18/2014 4.4   Final  . VLDL 03/18/2014 33  0 - 40 mg/dL Final  . LDL Cholesterol 03/18/2014 139* 0 - 99 mg/dL Final   Comment:                                 Total Cholesterol/HDL:CHD Risk                          Coronary Heart Disease Risk Table                                              Men   Women                           1/2 Average Risk   3.4   3.3                           Average Risk       5.0   4.4                           2 X Average Risk   9.6   7.1                           3 X Average Risk  23.4   11.0  Use the calculated Patient Ratio                          above and the CHD Risk Table                          to determine the patient's CHD Risk.                                                          ATP III CLASSIFICATION (LDL):                           <100     mg/dL   Optimal                           100-129  mg/dL   Near or Above                                             Optimal                           130-159  mg/dL   Borderline                           160-189  mg/dL   High                           >190     mg/dL   Very High  . Glucose-Capillary 03/17/2014 112* 70 - 99 mg/dL Final  . Glucose-Capillary 03/18/2014 125* 70 - 99 mg/dL Final  . Comment 1 03/18/2014 Documented in Chart   Final  . Comment 2 03/18/2014 Notify RN   Final  . Glucose-Capillary 03/18/2014 113* 70 - 99 mg/dL Final  .  Comment 1 03/18/2014 Documented in Chart   Final  . Comment 2 03/18/2014 Notify RN   Final    Chart reviewed  Assessment/Plan   ICD-9-CM ICD-10-CM   1. Type 2 diabetes mellitus with diabetic chronic kidney disease 250.40 E11.22 POC HgB A1c   585.9 N18.9   2. Hyperlipidemia 272.4 E78.5 Lipid Panel  3. Essential hypertension 401.9 I10   4. History of CVA with residual deficit 438.9 I69.30   5. CKD (chronic kidney disease), unspecified stage 585.9 N18.9   6. Macular degeneration of both eyes 362.50 H35.30   7. Anemia in chronic kidney disease 285.21 N18.9 CBC with Differential   585.9 D63.1     --get old records  --f/u with specialists as scheduled  --will determine appropriate diabetic med and statin once labs resulted. Continue other meds as Rx  --RTO in 1 month    Joliet Mallozzi S. Perlie Gold  [facility] [address] [place], [place] [address] [phone] Office (Wednesdays and Fridays 8 AM - 5 PM) [phone] Cell (Monday-Friday 8 AM - 5 PM)

## 2014-10-22 NOTE — Patient Instructions (Signed)
Will determine diabetic/cholesterol medication once lab tests resulted.  Will get old records from previous PCP and Dr Joelyn Oms  Recommend that you work no more than 4 hrs per day to reduce fatigue.

## 2014-10-27 DIAGNOSIS — H3532 Exudative age-related macular degeneration: Secondary | ICD-10-CM | POA: Diagnosis not present

## 2014-11-19 ENCOUNTER — Other Ambulatory Visit: Payer: Medicare Other

## 2014-11-19 DIAGNOSIS — E1122 Type 2 diabetes mellitus with diabetic chronic kidney disease: Secondary | ICD-10-CM

## 2014-11-19 DIAGNOSIS — E785 Hyperlipidemia, unspecified: Secondary | ICD-10-CM | POA: Diagnosis not present

## 2014-11-19 DIAGNOSIS — I1310 Hypertensive heart and chronic kidney disease without heart failure, with stage 1 through stage 4 chronic kidney disease, or unspecified chronic kidney disease: Secondary | ICD-10-CM | POA: Diagnosis not present

## 2014-11-19 DIAGNOSIS — E1129 Type 2 diabetes mellitus with other diabetic kidney complication: Secondary | ICD-10-CM | POA: Diagnosis not present

## 2014-11-19 DIAGNOSIS — N186 End stage renal disease: Secondary | ICD-10-CM | POA: Diagnosis not present

## 2014-11-19 DIAGNOSIS — D631 Anemia in chronic kidney disease: Secondary | ICD-10-CM

## 2014-11-19 DIAGNOSIS — N189 Chronic kidney disease, unspecified: Secondary | ICD-10-CM | POA: Diagnosis not present

## 2014-11-20 LAB — CBC WITH DIFFERENTIAL/PLATELET
BASOS: 0 %
Basophils Absolute: 0 10*3/uL (ref 0.0–0.2)
EOS: 10 %
Eosinophils Absolute: 0.4 10*3/uL (ref 0.0–0.4)
HCT: 38 % (ref 34.0–46.6)
HEMOGLOBIN: 12.7 g/dL (ref 11.1–15.9)
Immature Grans (Abs): 0 10*3/uL (ref 0.0–0.1)
Immature Granulocytes: 0 %
Lymphocytes Absolute: 0.9 10*3/uL (ref 0.7–3.1)
Lymphs: 23 %
MCH: 28.6 pg (ref 26.6–33.0)
MCHC: 33.4 g/dL (ref 31.5–35.7)
MCV: 86 fL (ref 79–97)
Monocytes Absolute: 0.3 10*3/uL (ref 0.1–0.9)
Monocytes: 9 %
NEUTROS PCT: 58 %
Neutrophils Absolute: 2.3 10*3/uL (ref 1.4–7.0)
Platelets: 284 10*3/uL (ref 150–379)
RBC: 4.44 x10E6/uL (ref 3.77–5.28)
RDW: 13.2 % (ref 12.3–15.4)
WBC: 4 10*3/uL (ref 3.4–10.8)

## 2014-11-20 LAB — LIPID PANEL
CHOLESTEROL TOTAL: 208 mg/dL — AB (ref 100–199)
Chol/HDL Ratio: 3.9 ratio units (ref 0.0–4.4)
HDL: 54 mg/dL (ref >39)
LDL Calculated: 123 mg/dL — ABNORMAL HIGH (ref 0–99)
TRIGLYCERIDES: 155 mg/dL — AB (ref 0–149)
VLDL Cholesterol Cal: 31 mg/dL (ref 5–40)

## 2014-11-20 LAB — HEMOGLOBIN A1C
ESTIMATED AVERAGE GLUCOSE: 163 mg/dL
Hgb A1c MFr Bld: 7.3 % — ABNORMAL HIGH (ref 4.8–5.6)

## 2014-11-22 ENCOUNTER — Telehealth: Payer: Self-pay

## 2014-11-22 MED ORDER — GLIPIZIDE ER 5 MG PO TB24
5.0000 mg | ORAL_TABLET | Freq: Every day | ORAL | Status: DC
Start: 2014-11-22 — End: 2015-05-19

## 2014-11-22 MED ORDER — SIMVASTATIN 10 MG PO TABS: 10.0000 mg | ORAL_TABLET | Freq: Every day | ORAL | Status: DC

## 2014-11-22 NOTE — Telephone Encounter (Signed)
-----   Message from [facility], [place] sent at 11/20/2014  8:10 PM EST ----- BS is uncontrolled- recommend Rx glipizide XL 5mg  daily; check BS fasting daily; Bad/LDL cholesterol above goal (needs <100)- recommned Rx simvastatin 10mg  qhs; watch fatty foods; nml blood counts; f/u as scheduled

## 2014-11-22 NOTE — Telephone Encounter (Signed)
Discussed results with patient's daughter Lonie Peak verbalized understanding of results. Patient with pending appointment on Wednesday, copy of report will be given at that time

## 2014-11-24 ENCOUNTER — Ambulatory Visit (INDEPENDENT_AMBULATORY_CARE_PROVIDER_SITE_OTHER): Payer: Medicare Other | Admitting: Internal Medicine

## 2014-11-24 ENCOUNTER — Encounter: Payer: Self-pay | Admitting: Internal Medicine

## 2014-11-24 VITALS — BP 140/82 | HR 89 | Temp 97.8°F | Resp 18 | Ht 61.0 in | Wt 118.8 lb

## 2014-11-24 DIAGNOSIS — R5383 Other fatigue: Secondary | ICD-10-CM | POA: Diagnosis not present

## 2014-11-24 DIAGNOSIS — E1122 Type 2 diabetes mellitus with diabetic chronic kidney disease: Secondary | ICD-10-CM

## 2014-11-24 DIAGNOSIS — E785 Hyperlipidemia, unspecified: Secondary | ICD-10-CM | POA: Diagnosis not present

## 2014-11-24 DIAGNOSIS — I10 Essential (primary) hypertension: Secondary | ICD-10-CM | POA: Diagnosis not present

## 2014-11-24 DIAGNOSIS — I6930 Unspecified sequelae of cerebral infarction: Secondary | ICD-10-CM | POA: Diagnosis not present

## 2014-11-24 DIAGNOSIS — N189 Chronic kidney disease, unspecified: Secondary | ICD-10-CM

## 2014-11-24 NOTE — Patient Instructions (Signed)
Need fasting labs in 1 month

## 2014-11-24 NOTE — Progress Notes (Signed)
Patient ID: Shirley Sanders, female   DOB: 1935/05/12, 80 y.o.   MRN: [id number]    Facility  [facility]    Place of Service:   OFFICE   Allergies  Allergen Reactions  . Codeine Other (See Comments)    hbp  . Contrast Media [Iodinated Diagnostic Agents]   . Fish Allergy   . Milk-Related Compounds     Chief Complaint  Patient presents with  . Medical Management of Chronic Issues    DM II, hyperlipidemia lab f/u    HPI:  80 yo female seen today for follow up DM and hyperlipidemia. She has not picked up Rx for meds yet. Plans to pick them up today. She does not check her BS at home. No low BS reactions. No numbness or tingling. Her daughter is present.   Pt still c/o fatigue  Medications: Patient's Medications  New Prescriptions   No medications on file  Previous Medications   AMLODIPINE BESYLATE PO    Take 5 mg by mouth at bedtime.   ASPIRIN EC 81 MG TABLET    Take 1 tablet (81 mg total) by mouth daily.   CETIRIZINE (ZYRTEC) 10 MG TABLET    Take 10 mg by mouth daily.   CHLORTHALIDONE PO    Take 12.5 mg by mouth every morning.   CHOLECALCIFEROL (VITAMIN D3) 3000 UNITS TABS    Take 1 tablet by mouth daily.   GLIPIZIDE (GLUCOTROL XL) 5 MG 24 HR TABLET    Take 1 tablet (5 mg total) by mouth daily with breakfast. For Diabetes   MULTIPLE VITAMINS-MINERALS (PRESERVISION AREDS) CAPS    Take 2 capsules by mouth 2 (two) times daily.   POTASSIUM CHLORIDE SA (K-DUR,KLOR-CON) 20 MEQ TABLET    Take 20 mEq by mouth daily.   PYRIDOXINE HCL (B-6 PO)    Take 100 mg by mouth daily.   SIMVASTATIN (ZOCOR) 10 MG TABLET    Take 1 tablet (10 mg total) by mouth at bedtime. For Elevated Cholesterol  Modified Medications   No medications on file  Discontinued Medications   No medications on file     Review of Systems As above. All other systems reviewed are negative.  Filed Vitals:   11/24/14 1526  BP: 140/82  Pulse: 89  Temp: 97.8 F (36.6 C)  TempSrc: Oral  Resp: 18  Height: 5\' 1"  (1.549 m)    Weight: 118 lb 12.8 oz (53.887 kg)  SpO2: 92%   Body mass index is 22.46 kg/(m^2).  Physical Exam GEN: looks well in NAD. AAO x 3 in NAD   Labs reviewed: Lab on 11/19/2014  Component Date Value Ref Range Status  . WBC 11/19/2014 4.0  3.4 - 10.8 x10E3/uL Final  . RBC 11/19/2014 4.44  3.77 - 5.28 x10E6/uL Final  . Hemoglobin 11/19/2014 12.7  11.1 - 15.9 g/dL Final  . HCT 11/19/2014 38.0  34.0 - 46.6 % Final  . MCV 11/19/2014 86  79 - 97 fL Final  . MCH 11/19/2014 28.6  26.6 - 33.0 pg Final  . MCHC 11/19/2014 33.4  31.5 - 35.7 g/dL Final  . RDW 11/19/2014 13.2  12.3 - 15.4 % Final  . Platelets 11/19/2014 284  150 - 379 x10E3/uL Final  . Neutrophils Relative % 11/19/2014 58   Final  . Lymphs 11/19/2014 23   Final  . Monocytes 11/19/2014 9   Final  . Eos 11/19/2014 10   Final  . Basos 11/19/2014 0   Final  . Neutrophils  Absolute 11/19/2014 2.3  1.4 - 7.0 x10E3/uL Final  . Lymphocytes Absolute 11/19/2014 0.9  0.7 - 3.1 x10E3/uL Final  . Monocytes Absolute 11/19/2014 0.3  0.1 - 0.9 x10E3/uL Final  . Eosinophils Absolute 11/19/2014 0.4  0.0 - 0.4 x10E3/uL Final  . Basophils Absolute 11/19/2014 0.0  0.0 - 0.2 x10E3/uL Final  . Immature Granulocytes 11/19/2014 0   Final  . Immature Grans (Abs) 11/19/2014 0.0  0.0 - 0.1 x10E3/uL Final  . Cholesterol, Total 11/19/2014 208* 100 - 199 mg/dL Final  . Triglycerides 11/19/2014 155* 0 - 149 mg/dL Final  . HDL 11/19/2014 54  >39 mg/dL Final   Comment: According to ATP-III Guidelines, HDL-C >59 mg/dL is considered a negative risk factor for CHD.   Marland Kitchen VLDL Cholesterol Cal 11/19/2014 31  5 - 40 mg/dL Final  . LDL Calculated 11/19/2014 123* 0 - 99 mg/dL Final  . Chol/HDL Ratio 11/19/2014 3.9  0.0 - 4.4 ratio units Final   Comment:                                   T. Chol/HDL Ratio                                             Men  Women                               1/2 Avg.Risk  3.4    3.3                                   Avg.Risk  5.0     4.4                                2X Avg.Risk  9.6    7.1                                3X Avg.Risk 23.4   11.0   . Hgb A1c MFr Bld 11/19/2014 7.3* 4.8 - 5.6 % Final   Comment:          Pre-diabetes: 5.7 - 6.4          Diabetes: >6.4          Glycemic control for adults with diabetes: <7.0   . Est. average glucose Bld gHb Est-m* 11/19/2014 163   Final     Assessment/Plan   ICD-9-CM ICD-10-CM   1. Type 2 diabetes mellitus with diabetic chronic kidney disease - borderline controlled 250.40 E11.22    585.9 N18.9   2. Hyperlipidemia - uncontrolled 272.4 E78.5 Lipid Panel     ALT  3. Essential hypertension - stable 401.9 I10   4. History of CVA with residual deficit 438.9 I69.30   5. Other fatigue 780.79 R53.83    possibly due to stroke in 2015    --discussed medications to start and goal to control BS and cholesterol  --check BS daily  --avoid complex CHO and reduce fatty foods  --continue other medications as ordered  --RTO in 3 mos for f/u. Check  fasting lipid panel and ALT in 1 month   Shirley Sanders  [facility] [address] [place], [place] [address] [phone] Office (Wednesdays and Fridays 8 AM - 5 PM) [phone] Cell (Monday-Friday 8 AM - 5 PM)   Will need diabetic foot exam at next ov- last one in October 2015.

## 2014-12-06 DIAGNOSIS — H3532 Exudative age-related macular degeneration: Secondary | ICD-10-CM | POA: Diagnosis not present

## 2015-01-03 ENCOUNTER — Other Ambulatory Visit: Payer: Medicare Other

## 2015-01-03 DIAGNOSIS — E785 Hyperlipidemia, unspecified: Secondary | ICD-10-CM | POA: Diagnosis not present

## 2015-01-04 LAB — LIPID PANEL
CHOLESTEROL TOTAL: 158 mg/dL (ref 100–199)
Chol/HDL Ratio: 2.5 ratio units (ref 0.0–4.4)
HDL: 62 mg/dL (ref >39)
LDL Calculated: 74 mg/dL (ref 0–99)
TRIGLYCERIDES: 112 mg/dL (ref 0–149)
VLDL CHOLESTEROL CAL: 22 mg/dL (ref 5–40)

## 2015-01-04 LAB — ALT: ALT: 15 IU/L (ref 0–32)

## 2015-01-12 DIAGNOSIS — E876 Hypokalemia: Secondary | ICD-10-CM | POA: Diagnosis not present

## 2015-01-12 DIAGNOSIS — I10 Essential (primary) hypertension: Secondary | ICD-10-CM | POA: Diagnosis not present

## 2015-01-12 DIAGNOSIS — N183 Chronic kidney disease, stage 3 (moderate): Secondary | ICD-10-CM | POA: Diagnosis not present

## 2015-01-12 DIAGNOSIS — R5383 Other fatigue: Secondary | ICD-10-CM | POA: Diagnosis not present

## 2015-01-28 DIAGNOSIS — H3532 Exudative age-related macular degeneration: Secondary | ICD-10-CM | POA: Diagnosis not present

## 2015-02-23 ENCOUNTER — Ambulatory Visit: Payer: Medicare Other | Admitting: Internal Medicine

## 2015-03-11 ENCOUNTER — Encounter: Payer: Self-pay | Admitting: Internal Medicine

## 2015-03-11 DIAGNOSIS — E1122 Type 2 diabetes mellitus with diabetic chronic kidney disease: Secondary | ICD-10-CM

## 2015-03-11 DIAGNOSIS — I889 Nonspecific lymphadenitis, unspecified: Secondary | ICD-10-CM | POA: Diagnosis not present

## 2015-03-18 ENCOUNTER — Ambulatory Visit: Payer: Medicare Other | Admitting: Internal Medicine

## 2015-03-25 DIAGNOSIS — H3532 Exudative age-related macular degeneration: Secondary | ICD-10-CM | POA: Diagnosis not present

## 2015-03-25 DIAGNOSIS — E119 Type 2 diabetes mellitus without complications: Secondary | ICD-10-CM | POA: Diagnosis not present

## 2015-03-25 LAB — HM DIABETES EYE EXAM

## 2015-04-01 ENCOUNTER — Ambulatory Visit: Payer: Medicare Other | Admitting: Internal Medicine

## 2015-04-04 ENCOUNTER — Other Ambulatory Visit: Payer: Medicare Other

## 2015-04-04 ENCOUNTER — Other Ambulatory Visit: Payer: Self-pay

## 2015-04-04 DIAGNOSIS — E1122 Type 2 diabetes mellitus with diabetic chronic kidney disease: Secondary | ICD-10-CM | POA: Diagnosis not present

## 2015-04-04 DIAGNOSIS — N189 Chronic kidney disease, unspecified: Secondary | ICD-10-CM | POA: Diagnosis not present

## 2015-04-05 LAB — MICROALBUMIN / CREATININE URINE RATIO
Creatinine, Urine: 55.6 mg/dL
MICROALB/CREAT RATIO: 62.4 mg/g creat — ABNORMAL HIGH (ref 0.0–30.0)
MICROALBUM., U, RANDOM: 34.7 ug/mL

## 2015-04-05 LAB — HEMOGLOBIN A1C
ESTIMATED AVERAGE GLUCOSE: 123 mg/dL
HEMOGLOBIN A1C: 5.9 % — AB (ref 4.8–5.6)

## 2015-04-05 LAB — BASIC METABOLIC PANEL
BUN/Creatinine Ratio: 26 (ref 11–26)
BUN: 29 mg/dL — ABNORMAL HIGH (ref 8–27)
CO2: 26 mmol/L (ref 18–29)
Calcium: 9.8 mg/dL (ref 8.7–10.3)
Chloride: 100 mmol/L (ref 97–108)
Creatinine, Ser: 1.11 mg/dL — ABNORMAL HIGH (ref 0.57–1.00)
GFR calc non Af Amer: 48 mL/min/{1.73_m2} — ABNORMAL LOW (ref >59)
GFR, EST AFRICAN AMERICAN: 55 mL/min/{1.73_m2} — AB (ref >59)
Glucose: 98 mg/dL (ref 65–99)
Potassium: 3.7 mmol/L (ref 3.5–5.2)
Sodium: 145 mmol/L — ABNORMAL HIGH (ref 134–144)

## 2015-04-06 ENCOUNTER — Encounter: Payer: Self-pay | Admitting: Internal Medicine

## 2015-04-06 ENCOUNTER — Ambulatory Visit (INDEPENDENT_AMBULATORY_CARE_PROVIDER_SITE_OTHER): Payer: Medicare Other | Admitting: Internal Medicine

## 2015-04-06 VITALS — BP 142/80 | HR 99 | Temp 97.9°F | Resp 18 | Ht 61.0 in | Wt 118.2 lb

## 2015-04-06 DIAGNOSIS — I10 Essential (primary) hypertension: Secondary | ICD-10-CM | POA: Diagnosis not present

## 2015-04-06 DIAGNOSIS — R221 Localized swelling, mass and lump, neck: Secondary | ICD-10-CM

## 2015-04-06 DIAGNOSIS — E1121 Type 2 diabetes mellitus with diabetic nephropathy: Secondary | ICD-10-CM | POA: Diagnosis not present

## 2015-04-06 DIAGNOSIS — E785 Hyperlipidemia, unspecified: Secondary | ICD-10-CM | POA: Diagnosis not present

## 2015-04-06 DIAGNOSIS — R634 Abnormal weight loss: Secondary | ICD-10-CM | POA: Diagnosis not present

## 2015-04-06 DIAGNOSIS — N189 Chronic kidney disease, unspecified: Secondary | ICD-10-CM | POA: Diagnosis not present

## 2015-04-06 MED ORDER — LISINOPRIL 2.5 MG PO TABS: 2.5000 mg | ORAL_TABLET | Freq: Every day | ORAL | Status: DC

## 2015-04-06 MED ORDER — ZOSTER VACCINE LIVE 19400 UNT/0.65ML ~~LOC~~ SOLR
0.6500 mL | Freq: Once | SUBCUTANEOUS | Status: DC
Start: 2015-04-06 — End: 2015-04-06

## 2015-04-06 MED ORDER — ZOSTER VACCINE LIVE 19400 UNT/0.65ML ~~LOC~~ SOLR: 0.6500 mL | Freq: Once | SUBCUTANEOUS | Status: DC

## 2015-04-06 NOTE — Progress Notes (Signed)
Patient ID: Shirley Sanders, female   DOB: [birth year], 80 y.o.   MRN: [id number]    Location:    [facility]   Place of Service:   OFFICE  Chief Complaint  Patient presents with  . Medical Management of Chronic Issues    HPI:  80 yo female seen today for f/u. She was seen by urgent care in early June for left neck swelling and pain. She was treated with keflex 500mg  BID x 10 days. She still has some swelling but NT now. She was told to f/u with PCP if it did not resolve.  She does not check BS at home. Taking glipizide daily. No low BS reactions  BP controlled on amlodipine  She takes simvastatin for her cholesterol  Appetite remains reduced. She drinks glucerna TID to supplement meals. She has lost about 20lbs in last year. She is down 10 oz since last visit. Her daughter is present  Needs new rx for zostavax  Past Medical History  Diagnosis Date  . Hypertension   . Diabetes mellitus without complication   . Renal disorder   . Stroke     left sided sensory and motor deficits  . Hyperlipidemia   . Osteoporosis     Past Surgical History  Procedure Laterality Date  . Tonsillectomy    . Abdominal hysterectomy      Patient Care Team: Gildardo Cranker, DO as PCP - General (Internal Medicine) Milus Height, MD as Referring Physician (Ophthalmology) Rexene Agent, MD as Attending Physician (Nephrology) Gildardo Cranker, DO as Consulting Physician (Internal Medicine)  History   Social History  . Marital Status: Divorced    Spouse Name: N/A  . Number of Children: 2  . 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 Smoking status: Never Smoker   . Smokeless tobacco: Never Used  . Alcohol Use: No  . Drug Use: No  . Sexual Activity: No   Other Topics Concern  . Not on file   Social History Narrative   Patient lives at home alone, one stories, no pets   Patient right handed   Patient drinks coffee and diet coke   Past  profession- Office     reports that she has never smoked. She has never used smokeless tobacco. She reports that she does not drink alcohol or use illicit drugs.  Allergies  Allergen Reactions  . Codeine Other (See Comments)    hbp  . Contrast Media [Iodinated Diagnostic Agents]   . Fish Allergy   . Milk-Related Compounds     Medications: Patient's Medications  New Prescriptions   No medications on file  Previous Medications   AMLODIPINE BESYLATE PO    Take 5 mg by mouth at bedtime.   ASPIRIN EC 81 MG TABLET    Take 1 tablet (81 mg total) by mouth daily.   CETIRIZINE (ZYRTEC) 10 MG TABLET    Take 10 mg by mouth daily.   CHLORTHALIDONE PO    Take 12.5 mg by mouth every morning.   CHOLECALCIFEROL (VITAMIN D3) 3000 UNITS TABS    Take 1 tablet by mouth daily.   GLIPIZIDE (GLUCOTROL XL) 5 MG 24 HR TABLET    Take 1 tablet (5 mg total) by mouth daily with breakfast. For Diabetes   MULTIPLE VITAMINS-MINERALS (PRESERVISION AREDS) CAPS    Take 2 capsules by mouth 2 (two) times daily.   POTASSIUM CHLORIDE SA (K-DUR,KLOR-CON) 20  MEQ TABLET    Take 20 mEq by mouth daily.   PYRIDOXINE HCL (B-6 PO)    Take 100 mg by mouth daily.   SIMVASTATIN (ZOCOR) 10 MG TABLET    Take 1 tablet (10 mg total) by mouth at bedtime. For Elevated Cholesterol  Modified Medications   No medications on file  Discontinued Medications   No medications on file    Review of Systems  Constitutional: Positive for appetite change and unexpected weight change. Negative for fever, chills, diaphoresis, activity change and fatigue.  HENT: Negative for ear pain and sore throat.   Eyes: Negative for visual disturbance.  Respiratory: Negative for cough, chest tightness and shortness of breath.   Cardiovascular: Negative for chest pain, palpitations and leg swelling.  Gastrointestinal: Negative for nausea, vomiting, abdominal pain, diarrhea, constipation and blood in stool.  Genitourinary: Negative for dysuria.    Musculoskeletal: Negative for arthralgias.  Neurological: Negative for dizziness, tremors, numbness and headaches.  Psychiatric/Behavioral: Negative for sleep disturbance. The patient is not nervous/anxious.     Filed Vitals:   04/06/15 1159  BP: 142/80  Pulse: 99  Temp: 97.9 F (36.6 C)  TempSrc: Oral  Resp: 18  Height: 5\' 1"  (1.549 m)  Weight: 118 lb 3.2 oz (53.615 kg)  SpO2: 96%   Body mass index is 22.35 kg/(m^2).  Physical Exam  Constitutional: She is oriented to person, place, and time. She appears well-developed and well-nourished. No distress.  HENT:  Mouth/Throat: Oropharynx is clear and moist. No oropharyngeal exudate.  Eyes: Pupils are equal, round, and reactive to light. No scleral icterus.  Neck: Neck supple. Carotid bruit is not present. No tracheal deviation present. No thyromegaly present.  Cardiovascular: Normal rate, regular rhythm, normal heart sounds and intact distal pulses.  Exam reveals no gallop and no friction rub.   No murmur heard. No LE edema b/l. no calf TTP.   Pulmonary/Chest: Effort normal and breath sounds normal. No stridor. No respiratory distress. She has no wheezes. She has no rales.  Abdominal: Soft. Bowel sounds are normal. She exhibits no distension and no mass. There is no hepatomegaly. There is no tenderness. There is no rebound and no guarding.  Musculoskeletal: She exhibits no tenderness.  Lymphadenopathy:    She has cervical adenopathy (left pea sized TTP anterior neck).  Neurological: She is alert and oriented to person, place, and time. She has normal reflexes.  Skin: Skin is warm and dry. No rash noted.  Psychiatric: She has a normal mood and affect. Her behavior is normal. Thought content normal.     Labs reviewed: Appointment on 04/04/2015  Component Date Value Ref Range Status  . Glucose 04/04/2015 98  65 - 99 mg/dL Final  . BUN 04/04/2015 29* 8 - 27 mg/dL Final  . Creatinine, Ser 04/04/2015 1.11* 0.57 - 1.00 mg/dL Final   . GFR calc non Af Amer 04/04/2015 48* >59 mL/min/1.73 Final  . GFR calc Af Amer 04/04/2015 55* >59 mL/min/1.73 Final  . BUN/Creatinine Ratio 04/04/2015 26  11 - 26 Final  . Sodium 04/04/2015 145* 134 - 144 mmol/L Final  . Potassium 04/04/2015 3.7  3.5 - 5.2 mmol/L Final  . Chloride 04/04/2015 100  97 - 108 mmol/L Final  . CO2 04/04/2015 26  18 - 29 mmol/L Final  . Calcium 04/04/2015 9.8  8.7 - 10.3 mg/dL Final  . Creatinine, Urine 04/04/2015 55.6  Not Estab. mg/dL Final  . Microalbum.,U,Random 04/04/2015 34.7  Not Estab. ug/mL Final  . MICROALB/CREAT RATIO  04/04/2015 62.4* 0.0 - 30.0 mg/g creat Final  . Hgb A1c MFr Bld 04/04/2015 5.9* 4.8 - 5.6 % Final   Comment:          Pre-diabetes: 5.7 - 6.4          Diabetes: >6.4          Glycemic control for adults with diabetes: <7.0   . Est. average glucose Bld gHb Est-m* 04/04/2015 123   Final    No results found.   Assessment/Plan   ICD-9-CM ICD-10-CM   1. Lump in neck - possible reactive lymph node but r/o occult process 784.2 R22.1 US Soft Tissue Head/Neck  2. Type 2 diabetes mellitus with diabetic nephropathy - stable 250.40 E11.21 lisinopril (PRINIVIL,ZESTRIL) 2.5 MG tablet   583.81    3. Essential hypertension - stable 401.9 I10   4. CKD (chronic kidney disease), unspecified stage 585.9 N18.9 lisinopril (PRINIVIL,ZESTRIL) 2.5 MG tablet  5. Hyperlipidemia - stable 272.4 E78.5   6. Weight loss - pt declined nutrition eval. She will cont glucerna TID  --Continue current medications as ordered  --START low dose lisinopril to protect kidneys from diabetes  --new Rx given for zostavax  --Follow up in 3 mos for routine visit  Chesnie Capell S. Perlie Gold  [facility] [address] [place], [place] [address] [phone] Cell (Monday-Friday 8 AM - 5 PM) [phone] After 5 PM and follow prompts

## 2015-04-06 NOTE — Patient Instructions (Signed)
Continue current medications as ordered  START low dose lisinopril to protect kidneys from diabetes  Follow up in 3 mos for routine visit

## 2015-04-08 ENCOUNTER — Other Ambulatory Visit: Payer: Self-pay

## 2015-04-13 ENCOUNTER — Ambulatory Visit
Admission: RE | Admit: 2015-04-13 | Discharge: 2015-04-13 | Disposition: A | Discharge status: Home / Self Care | Payer: Medicare Other | Source: Ambulatory Visit | Attending: Internal Medicine | Admitting: Internal Medicine

## 2015-04-13 DIAGNOSIS — R221 Localized swelling, mass and lump, neck: Secondary | ICD-10-CM

## 2015-04-14 ENCOUNTER — Telehealth: Payer: Self-pay

## 2015-04-14 NOTE — Telephone Encounter (Signed)
-----   Message from [place], [place] sent at 04/13/2015  5:15 PM EDT ----- Probable left submandibular gland stone on ultrasound- recommend ENT eval

## 2015-04-22 ENCOUNTER — Encounter: Payer: Self-pay | Admitting: Internal Medicine

## 2015-04-22 NOTE — Telephone Encounter (Signed)
Dr.Carter please advise on referral order

## 2015-04-24 ENCOUNTER — Other Ambulatory Visit: Payer: Self-pay | Admitting: Internal Medicine

## 2015-04-24 DIAGNOSIS — R221 Localized swelling, mass and lump, neck: Secondary | ICD-10-CM

## 2015-04-26 NOTE — Telephone Encounter (Signed)
This was communicated via mychart

## 2015-05-10 ENCOUNTER — Other Ambulatory Visit: Payer: Self-pay | Admitting: Internal Medicine

## 2015-05-11 DIAGNOSIS — K118 Other diseases of salivary glands: Secondary | ICD-10-CM | POA: Diagnosis not present

## 2015-05-11 DIAGNOSIS — K1120 Sialoadenitis, unspecified: Secondary | ICD-10-CM | POA: Diagnosis not present

## 2015-05-13 DIAGNOSIS — H3532 Exudative age-related macular degeneration: Secondary | ICD-10-CM | POA: Diagnosis not present

## 2015-05-19 ENCOUNTER — Other Ambulatory Visit: Payer: Self-pay | Admitting: Internal Medicine

## 2015-05-23 ENCOUNTER — Other Ambulatory Visit (HOSPITAL_COMMUNITY)
Admission: RE | Admit: 2015-05-23 | Discharge: 2015-05-23 | Disposition: A | Discharge status: Home / Self Care | Payer: Medicare Other | Source: Ambulatory Visit | Attending: Otolaryngology | Admitting: Otolaryngology

## 2015-05-23 ENCOUNTER — Other Ambulatory Visit: Payer: Self-pay | Admitting: Otolaryngology

## 2015-05-23 DIAGNOSIS — R220 Localized swelling, mass and lump, head: Secondary | ICD-10-CM | POA: Diagnosis not present

## 2015-05-23 DIAGNOSIS — K1120 Sialoadenitis, unspecified: Secondary | ICD-10-CM | POA: Diagnosis not present

## 2015-05-23 DIAGNOSIS — K118 Other diseases of salivary glands: Secondary | ICD-10-CM | POA: Diagnosis not present

## 2015-06-07 DIAGNOSIS — K1120 Sialoadenitis, unspecified: Secondary | ICD-10-CM | POA: Diagnosis not present

## 2015-06-24 DIAGNOSIS — H3532 Exudative age-related macular degeneration: Secondary | ICD-10-CM | POA: Diagnosis not present

## 2015-07-01 ENCOUNTER — Encounter: Payer: Self-pay | Admitting: Internal Medicine

## 2015-07-08 ENCOUNTER — Ambulatory Visit (INDEPENDENT_AMBULATORY_CARE_PROVIDER_SITE_OTHER): Payer: Medicare Other | Admitting: Internal Medicine

## 2015-07-08 ENCOUNTER — Encounter: Payer: Self-pay | Admitting: Internal Medicine

## 2015-07-08 VITALS — BP 98/68 | HR 82 | Temp 97.8°F | Resp 20 | Ht 61.0 in | Wt 118.8 lb

## 2015-07-08 DIAGNOSIS — N183 Chronic kidney disease, stage 3 (moderate): Secondary | ICD-10-CM | POA: Diagnosis not present

## 2015-07-08 DIAGNOSIS — M810 Age-related osteoporosis without current pathological fracture: Secondary | ICD-10-CM

## 2015-07-08 DIAGNOSIS — Z23 Encounter for immunization: Secondary | ICD-10-CM | POA: Diagnosis not present

## 2015-07-08 DIAGNOSIS — E1121 Type 2 diabetes mellitus with diabetic nephropathy: Secondary | ICD-10-CM | POA: Diagnosis not present

## 2015-07-08 DIAGNOSIS — E538 Deficiency of other specified B group vitamins: Secondary | ICD-10-CM | POA: Diagnosis not present

## 2015-07-08 DIAGNOSIS — E785 Hyperlipidemia, unspecified: Secondary | ICD-10-CM | POA: Diagnosis not present

## 2015-07-08 DIAGNOSIS — I10 Essential (primary) hypertension: Secondary | ICD-10-CM | POA: Diagnosis not present

## 2015-07-08 DIAGNOSIS — I6930 Unspecified sequelae of cerebral infarction: Secondary | ICD-10-CM

## 2015-07-08 DIAGNOSIS — H3530 Unspecified macular degeneration: Secondary | ICD-10-CM | POA: Diagnosis not present

## 2015-07-08 NOTE — Progress Notes (Signed)
Patient ID: Shirley Sanders, female   DOB: 1934-12-03, 80 y.o.   MRN: [id number]    Location:    [facility]   Place of Service:   OFFICE  Chief Complaint  Patient presents with  . Medical Management of Chronic Issues    3 month follow-up  . Immunizations    Will take flu shot today    HPI:  80 yo female seen today for f/u. She continues to work 6 hrs per day M-F.   DM - she does not check BS at home. Last A1c 5.9% in June 2016. No low BS reactions. No numbness or tingling. She takes glipizide  HTN/hyperlipidemia - she takes chlorthalidone and occasionally has leg cramps. Trying to drink more water during the day. She likes Aquafina  Hx CVA - stable on ASA. No new deficits  Osteoporosis - takes vit D3  CKD - stable; takes Vit D3 and B12  She sees eye specialist for macular degeneration. Takes eye vitamins  Past Medical History  Diagnosis Date  . Hypertension   . Diabetes mellitus without complication   . Renal disorder   . Stroke     left sided sensory and motor deficits  . Hyperlipidemia   . Osteoporosis     Past Surgical History  Procedure Laterality Date  . Tonsillectomy    . Abdominal hysterectomy      Patient Care Team: Gildardo Cranker, DO as PCP - General (Internal Medicine) Milus Height, MD as Referring Physician (Ophthalmology) Rexene Agent, MD as Attending Physician (Nephrology) Gildardo Cranker, DO as Consulting Physician (Internal Medicine)  Social History   Social History  . Marital Status: Divorced    Spouse Name: N/A  . Number of Children: 2  . 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 Smoking status: Never Smoker   . Smokeless tobacco: Never Used  . Alcohol Use: No  . Drug Use: No  . Sexual Activity: No   Other Topics Concern  . Not on file   Social History Narrative   Patient lives at home alone, one stories, no pets   Patient right handed   Patient drinks coffee and diet coke   Past profession- Office     reports that she has never smoked. She has never used smokeless tobacco. She reports that she does not drink alcohol or use illicit drugs.  Allergies  Allergen Reactions  . Codeine Other (See Comments)    hbp  . Contrast Media [Iodinated Diagnostic Agents]   . Fish Allergy   . Milk-Related Compounds     Medications: Patient's Medications  New Prescriptions   No medications on file  Previous Medications   AMLODIPINE BESYLATE PO    Take 5 mg by mouth at bedtime.   ASPIRIN EC 81 MG TABLET    Take 1 tablet (81 mg total) by mouth daily.   CETIRIZINE (ZYRTEC) 10 MG TABLET    Take 10 mg by mouth daily.   CHLORTHALIDONE PO    Take 12.5 mg by mouth every morning.   CHOLECALCIFEROL (VITAMIN D3) 3000 UNITS TABS    Take 1 tablet by mouth daily.   GLIPIZIDE (GLUCOTROL XL) 5 MG 24 HR TABLET    TAKE ONE TABLET BY MOUTH ONCE DAILY WITH BREAKFAST FOR DIABETES   LISINOPRIL (PRINIVIL,ZESTRIL) 2.5 MG TABLET    Take 1 tablet (2.5 mg total) by mouth daily.   MULTIPLE VITAMINS-MINERALS (PRESERVISION AREDS)  CAPS    Take 2 capsules by mouth 2 (two) times daily.   POTASSIUM CHLORIDE SA (K-DUR,KLOR-CON) 20 MEQ TABLET    Take 20 mEq by mouth daily.   PYRIDOXINE HCL (B-6 PO)    Take 100 mg by mouth daily.   SIMVASTATIN (ZOCOR) 10 MG TABLET    TAKE ONE TABLET BY MOUTH AT BEDTIME FOR  ELEVATED  CHOLESTEROL.   ZOSTER VACCINE LIVE, PF, (ZOSTAVAX) 60454 UNT/0.65ML INJECTION    Inject 19,400 Units into the skin once.  Modified Medications   No medications on file  Discontinued Medications   No medications on file    Review of Systems  Constitutional: Positive for appetite change. Negative for fever, chills, diaphoresis, activity change and fatigue.  HENT: Negative for ear pain and sore throat.   Eyes: Positive for visual disturbance.  Respiratory: Negative for cough, chest tightness and shortness of breath.   Cardiovascular: Negative for chest pain, palpitations and leg swelling.    Gastrointestinal: Negative for nausea, vomiting, abdominal pain, diarrhea, constipation and blood in stool.  Genitourinary: Negative for dysuria.  Musculoskeletal: Positive for arthralgias and gait problem.  Neurological: Positive for weakness. Negative for dizziness, tremors, numbness and headaches.  Psychiatric/Behavioral: Negative for sleep disturbance. The patient is not nervous/anxious.     Filed Vitals:   07/08/15 1404  BP: 98/68  Pulse: 82  Temp: 97.8 F (36.6 C)  TempSrc: Oral  Resp: 20  Height: 5\' 1"  (1.549 m)  Weight: 118 lb 12.8 oz (53.887 kg)  SpO2: 98%   Body mass index is 22.46 kg/(m^2).  Physical Exam  Constitutional: She is oriented to person, place, and time. She appears well-developed and well-nourished.  HENT:  Mouth/Throat: Oropharynx is clear and moist. No oropharyngeal exudate.  Eyes: Pupils are equal, round, and reactive to light. No scleral icterus.  Neck: Neck supple. Carotid bruit is not present. No tracheal deviation present. No thyromegaly present.  Cardiovascular: Normal rate, regular rhythm, normal heart sounds and intact distal pulses.  Exam reveals no gallop and no friction rub.   No murmur heard. No LE edema b/l. no calf TTP.   Pulmonary/Chest: Effort normal and breath sounds normal. No stridor. No respiratory distress. She has no wheezes. She has no rales.  Abdominal: Soft. Bowel sounds are normal. She exhibits no distension and no mass. There is no hepatomegaly. There is no tenderness. There is no rebound and no guarding.  Musculoskeletal: She exhibits edema and tenderness.  Right short leg  Lymphadenopathy:    She has no cervical adenopathy.  Neurological: She is alert and oriented to person, place, and time.  Left hemiparesis  Skin: Skin is warm and dry. No rash noted.  Psychiatric: She has a normal mood and affect. Her behavior is normal. Thought content normal.     Labs reviewed: No visits with results within 3 Month(s) from this  visit. Latest known visit with results is:  Appointment on 04/04/2015  Component Date Value Ref Range Status  . Glucose 04/04/2015 98  65 - 99 mg/dL Final  . BUN 04/04/2015 29* 8 - 27 mg/dL Final  . Creatinine, Ser 04/04/2015 1.11* 0.57 - 1.00 mg/dL Final  . GFR calc non Af Amer 04/04/2015 48* >59 mL/min/1.73 Final  . GFR calc Af Amer 04/04/2015 55* >59 mL/min/1.73 Final  . BUN/Creatinine Ratio 04/04/2015 26  11 - 26 Final  . Sodium 04/04/2015 145* 134 - 144 mmol/L Final  . Potassium 04/04/2015 3.7  3.5 - 5.2 mmol/L Final  . Chloride  04/04/2015 100  97 - 108 mmol/L Final  . CO2 04/04/2015 26  18 - 29 mmol/L Final  . Calcium 04/04/2015 9.8  8.7 - 10.3 mg/dL Final  . Creatinine, Urine 04/04/2015 55.6  Not Estab. mg/dL Final  . Microalbum.,U,Random 04/04/2015 34.7  Not Estab. ug/mL Final  . MICROALB/CREAT RATIO 04/04/2015 62.4* 0.0 - 30.0 mg/g creat Final  . Hgb A1c MFr Bld 04/04/2015 5.9* 4.8 - 5.6 % Final   Comment:          Pre-diabetes: 5.7 - 6.4          Diabetes: >6.4          Glycemic control for adults with diabetes: <7.0   . Est. average glucose Bld gHb Est-m* 04/04/2015 123   Final    No results found.   Assessment/Plan   ICD-9-CM ICD-10-CM   1. Type 2 diabetes mellitus with diabetic nephropathy - stable 250.40 E11.21 CMP   583.81  Hemoglobin A1c  2. Essential hypertension - stable 401.9 I10 CMP  3. Hyperlipidemia - stable 272.4 E78.5 Lipid Panel  4. CKD (chronic kidney disease), stage 3 - stable 585.3 N18.3 Vitamin D, 25-hydroxy  5. History of CVA with residual deficit - stable 438.9 I69.30   6. Macular degeneration of both eyes - stable 362.50 H35.30   7. B12 deficiency by hx 266.2 E53.8 Vitamin B12  8. Osteoporosis - stable 733.00 M81.0 Vitamin D, 25-hydroxy  9. Encounter for immunization Z23 Z23     Recommend changing vitamins to a multivitamin, eye vitamins, B12 and Vitamin D3 3000 units.  May stop other vitamins  Continue other medications as  ordered  Follow up in 3 mos for routine visit.  Return for fasting labs in the next 1-2 weeks. Will call with results  Monica S. Perlie Gold  [facility] [address] [place], [place] [address] [phone] Cell (Monday-Friday 8 AM - 5 PM) [phone] After 5 PM and follow prompts

## 2015-07-08 NOTE — Patient Instructions (Addendum)
Recommend changing vitamins to a multivitamin, eye vitamins, B12 and Vitamin D3 3000 units.  May stop other vitamins  Continue other medications as ordered  Follow up in 3 mos for routine visit.  Return to office for fasting labs in the next 1-2 weeks. Will call with results

## 2015-07-20 DIAGNOSIS — N183 Chronic kidney disease, stage 3 (moderate): Secondary | ICD-10-CM | POA: Diagnosis not present

## 2015-07-26 ENCOUNTER — Other Ambulatory Visit: Payer: Medicare Other

## 2015-07-26 DIAGNOSIS — E1121 Type 2 diabetes mellitus with diabetic nephropathy: Secondary | ICD-10-CM

## 2015-07-26 DIAGNOSIS — N183 Chronic kidney disease, stage 3 (moderate): Secondary | ICD-10-CM

## 2015-07-26 DIAGNOSIS — I10 Essential (primary) hypertension: Secondary | ICD-10-CM | POA: Diagnosis not present

## 2015-07-26 DIAGNOSIS — E538 Deficiency of other specified B group vitamins: Secondary | ICD-10-CM | POA: Diagnosis not present

## 2015-07-26 DIAGNOSIS — E785 Hyperlipidemia, unspecified: Secondary | ICD-10-CM

## 2015-07-26 DIAGNOSIS — M810 Age-related osteoporosis without current pathological fracture: Secondary | ICD-10-CM

## 2015-07-27 LAB — COMPREHENSIVE METABOLIC PANEL
A/G RATIO: 2 (ref 1.1–2.5)
ALT: 18 IU/L (ref 0–32)
AST: 25 IU/L (ref 0–40)
Albumin: 4.5 g/dL (ref 3.5–4.8)
Alkaline Phosphatase: 73 IU/L (ref 39–117)
BUN/Creatinine Ratio: 18 (ref 11–26)
BUN: 21 mg/dL (ref 8–27)
Bilirubin Total: 0.3 mg/dL (ref 0.0–1.2)
CALCIUM: 9.3 mg/dL (ref 8.7–10.3)
CO2: 25 mmol/L (ref 18–29)
CREATININE: 1.19 mg/dL — AB (ref 0.57–1.00)
Chloride: 93 mmol/L — ABNORMAL LOW (ref 97–106)
GFR, EST AFRICAN AMERICAN: 51 mL/min/{1.73_m2} — AB (ref >59)
GFR, EST NON AFRICAN AMERICAN: 44 mL/min/{1.73_m2} — AB (ref >59)
GLOBULIN, TOTAL: 2.2 g/dL (ref 1.5–4.5)
Glucose: 105 mg/dL — ABNORMAL HIGH (ref 65–99)
POTASSIUM: 4 mmol/L (ref 3.5–5.2)
Sodium: 135 mmol/L — ABNORMAL LOW (ref 136–144)
TOTAL PROTEIN: 6.7 g/dL (ref 6.0–8.5)

## 2015-07-27 LAB — VITAMIN B12: Vitamin B-12: >2000 pg/mL — ABNORMAL HIGH (ref 211–946)

## 2015-07-27 LAB — LIPID PANEL
CHOLESTEROL TOTAL: 138 mg/dL (ref 100–199)
Chol/HDL Ratio: 2.2 ratio units (ref 0.0–4.4)
HDL: 64 mg/dL (ref >39)
LDL CALC: 60 mg/dL (ref 0–99)
TRIGLYCERIDES: 68 mg/dL (ref 0–149)
VLDL CHOLESTEROL CAL: 14 mg/dL (ref 5–40)

## 2015-07-27 LAB — HEMOGLOBIN A1C
ESTIMATED AVERAGE GLUCOSE: 120 mg/dL
Hgb A1c MFr Bld: 5.8 % — ABNORMAL HIGH (ref 4.8–5.6)

## 2015-07-27 LAB — VITAMIN D 25 HYDROXY (VIT D DEFICIENCY, FRACTURES): VIT D 25 HYDROXY: 73.4 ng/mL (ref 30.0–100.0)

## 2015-08-15 ENCOUNTER — Other Ambulatory Visit: Payer: Self-pay | Admitting: Internal Medicine

## 2015-08-15 DIAGNOSIS — H353212 Exudative age-related macular degeneration, right eye, with inactive choroidal neovascularization: Secondary | ICD-10-CM | POA: Diagnosis not present

## 2015-09-02 DIAGNOSIS — M79601 Pain in right arm: Secondary | ICD-10-CM | POA: Diagnosis not present

## 2015-09-09 DIAGNOSIS — S46111A Strain of muscle, fascia and tendon of long head of biceps, right arm, initial encounter: Secondary | ICD-10-CM | POA: Diagnosis not present

## 2015-09-12 ENCOUNTER — Encounter: Payer: Self-pay | Admitting: Internal Medicine

## 2015-09-19 DIAGNOSIS — I10 Essential (primary) hypertension: Secondary | ICD-10-CM | POA: Diagnosis not present

## 2015-09-19 DIAGNOSIS — N184 Chronic kidney disease, stage 4 (severe): Secondary | ICD-10-CM | POA: Diagnosis not present

## 2015-09-19 DIAGNOSIS — E876 Hypokalemia: Secondary | ICD-10-CM | POA: Diagnosis not present

## 2015-10-05 DIAGNOSIS — H353212 Exudative age-related macular degeneration, right eye, with inactive choroidal neovascularization: Secondary | ICD-10-CM | POA: Diagnosis not present

## 2015-10-07 ENCOUNTER — Encounter: Payer: Self-pay | Admitting: Internal Medicine

## 2015-10-07 ENCOUNTER — Ambulatory Visit (INDEPENDENT_AMBULATORY_CARE_PROVIDER_SITE_OTHER): Payer: Medicare Other | Admitting: Internal Medicine

## 2015-10-07 VITALS — BP 118/72 | HR 75 | Temp 97.7°F | Resp 20 | Ht 62.0 in | Wt 109.6 lb

## 2015-10-07 DIAGNOSIS — E785 Hyperlipidemia, unspecified: Secondary | ICD-10-CM

## 2015-10-07 DIAGNOSIS — I10 Essential (primary) hypertension: Secondary | ICD-10-CM

## 2015-10-07 DIAGNOSIS — I6930 Unspecified sequelae of cerebral infarction: Secondary | ICD-10-CM

## 2015-10-07 DIAGNOSIS — N183 Chronic kidney disease, stage 3 (moderate): Secondary | ICD-10-CM

## 2015-10-07 DIAGNOSIS — E1121 Type 2 diabetes mellitus with diabetic nephropathy: Secondary | ICD-10-CM

## 2015-10-07 NOTE — Progress Notes (Signed)
Patient ID: Shirley Sanders, female   DOB: Dec 24, 1934, 80 y.o.   MRN: [id number]    Location:    [facility]   Place of Service:   OFFICE  Chief Complaint  Patient presents with  . Medical Management of Chronic Issues    3 month follow-up for Hypertension,DM,Hyperlipidemia  . OTHER    Daughter in room with patient    HPI:  80 yo female seen today for f/u.  She continues to work 6 hrs per day M-F.   DM - she does not check BS at home. Last A1c 5.9% in June 2016. No low BS reactions. No numbness or tingling. She takes glipizide  HTN/hyperlipidemia - she takes chlorthalidone and occasionally has leg cramps. Trying to drink more water during the day. She likes Aquafina  Hx CVA - stable on ASA. No new deficits  Osteoporosis - takes vit D3  CKD - stable; takes Vit D3 and B12  She sees eye specialist for macular degeneration. Takes eye vitamins   Past Medical History  Diagnosis Date  . Hypertension   . Diabetes mellitus without complication ([place])   . Renal disorder   . Stroke [facility])     left sided sensory and motor deficits  . Hyperlipidemia   . Osteoporosis     Past Surgical History  Procedure Laterality Date  . Tonsillectomy    . Abdominal hysterectomy      Patient Care Team: Gildardo Cranker, DO as PCP - General (Internal Medicine) Milus Height, MD as Referring Physician (Ophthalmology) Rexene Agent, MD as Attending Physician (Nephrology) Gildardo Cranker, DO as Consulting Physician (Internal Medicine)  Social History   Social History  . Marital Status: Divorced    Spouse Name: N/A  . Number of Children: 2  . 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 Smoking status: Never Smoker   . Smokeless tobacco: Never Used  . Alcohol Use: No  . Drug Use: No  . Sexual Activity: No   Other Topics Concern  . Not on file   Social History Narrative   Patient lives at home alone, one stories, no pets   Patient right  handed   Patient drinks coffee and diet coke   Past profession- Office     reports that she has never smoked. She has never used smokeless tobacco. She reports that she does not drink alcohol or use illicit drugs.  Allergies  Allergen Reactions  . Codeine Other (See Comments)    hbp  . Contrast Media [Iodinated Diagnostic Agents]   . Fish Allergy   . Milk-Related Compounds     Medications: Patient's Medications  New Prescriptions   No medications on file  Previous Medications   AMLODIPINE BESYLATE PO    Take 5 mg by mouth at bedtime.   ASPIRIN EC 81 MG TABLET    Take 1 tablet (81 mg total) by mouth daily.   CETIRIZINE (ZYRTEC) 10 MG TABLET    Take 10 mg by mouth daily.   CHLORTHALIDONE PO    Take 12.5 mg by mouth every morning.   CHOLECALCIFEROL (VITAMIN D3) 3000 UNITS TABS    Take 1 tablet by mouth daily.   GLIPIZIDE (GLUCOTROL XL) 5 MG 24 HR TABLET    TAKE ONE TABLET BY MOUTH ONCE DAILY WITH BREAKFAST FOR DIABETES   LISINOPRIL (PRINIVIL,ZESTRIL) 2.5 MG TABLET    TAKE ONE TABLET BY MOUTH DAILY  MULTIPLE VITAMINS-MINERALS (PRESERVISION AREDS) CAPS    Take 2 capsules by mouth 2 (two) times daily.   POTASSIUM CHLORIDE SA (K-DUR,KLOR-CON) 20 MEQ TABLET    Take 20 mEq by mouth daily.   PYRIDOXINE HCL (B-6 PO)    Take 100 mg by mouth daily.   SIMVASTATIN (ZOCOR) 10 MG TABLET    TAKE ONE TABLET BY MOUTH AT BEDTIME FOR  ELEVATED  CHOLESTEROL.   ZOSTER VACCINE LIVE, PF, (ZOSTAVAX) 09811 UNT/0.65ML INJECTION    Inject 19,400 Units into the skin once.  Modified Medications   No medications on file  Discontinued Medications   No medications on file    Review of Systems  Constitutional: Negative for fever, chills, diaphoresis, activity change, appetite change and fatigue.  HENT: Negative for ear pain and sore throat.   Eyes: Negative for visual disturbance.  Respiratory: Negative for cough, chest tightness and shortness of breath.   Cardiovascular: Negative for chest pain,  palpitations and leg swelling.  Gastrointestinal: Negative for nausea, vomiting, abdominal pain, diarrhea, constipation and blood in stool.  Genitourinary: Negative for dysuria.  Musculoskeletal: Positive for arthralgias.  Neurological: Negative for dizziness, tremors, numbness and headaches.  Psychiatric/Behavioral: Negative for sleep disturbance. The patient is not nervous/anxious.     Filed Vitals:   10/07/15 1130  BP: 118/72  Pulse: 75  Temp: 97.7 F (36.5 C)  TempSrc: Oral  Resp: 20  Height: 5\' 2"  (1.575 m)  Weight: 109 lb 9.6 oz (49.714 kg)  SpO2: 96%   Body mass index is 20.04 kg/(m^2).  Physical Exam  Constitutional: She is oriented to person, place, and time. She appears well-developed and well-nourished.  HENT:  Mouth/Throat: Oropharynx is clear and moist. No oropharyngeal exudate.  Eyes: Pupils are equal, round, and reactive to light. No scleral icterus.  Neck: Neck supple. Carotid bruit is not present. No tracheal deviation present. No thyromegaly present.  Cardiovascular: Normal rate, regular rhythm, normal heart sounds and intact distal pulses.  Exam reveals no gallop and no friction rub.   No murmur heard. No LE edema b/l. no calf TTP.   Pulmonary/Chest: Effort normal and breath sounds normal. No stridor. No respiratory distress. She has no wheezes. She has no rales.  Abdominal: Soft. Bowel sounds are normal. She exhibits no distension and no mass. There is no hepatomegaly. There is no tenderness. There is no rebound and no guarding.  Lymphadenopathy:    She has no cervical adenopathy.  Neurological: She is alert and oriented to person, place, and time.  Skin: Skin is warm and dry. No rash noted.  Psychiatric: She has a normal mood and affect. Her behavior is normal. Thought content normal.     Labs reviewed: Appointment on 07/26/2015  Component Date Value Ref Range Status  . Glucose 07/26/2015 105* 65 - 99 mg/dL Final  . BUN 07/26/2015 21  8 - 27 mg/dL  Final  . Creatinine, Ser 07/26/2015 1.19* 0.57 - 1.00 mg/dL Final  . GFR calc non Af Amer 07/26/2015 44* >59 mL/min/1.73 Final  . GFR calc Af Amer 07/26/2015 51* >59 mL/min/1.73 Final  . BUN/Creatinine Ratio 07/26/2015 18  11 - 26 Final  . Sodium 07/26/2015 135* 136 - 144 mmol/L Final                 **Please note reference interval change**  . Potassium 07/26/2015 4.0  3.5 - 5.2 mmol/L Final                 **Please note  reference interval change**  . Chloride 07/26/2015 93* 97 - 106 mmol/L Final                 **Please note reference interval change**  . CO2 07/26/2015 25  18 - 29 mmol/L Final  . Calcium 07/26/2015 9.3  8.7 - 10.3 mg/dL Final  . Total Protein 07/26/2015 6.7  6.0 - 8.5 g/dL Final  . Albumin 07/26/2015 4.5  3.5 - 4.8 g/dL Final  . Globulin, Total 07/26/2015 2.2  1.5 - 4.5 g/dL Final  . Albumin/Globulin Ratio 07/26/2015 2.0  1.1 - 2.5 Final  . Bilirubin Total 07/26/2015 0.3  0.0 - 1.2 mg/dL Final  . Alkaline Phosphatase 07/26/2015 73  39 - 117 IU/L Final  . AST 07/26/2015 25  0 - 40 IU/L Final  . ALT 07/26/2015 18  0 - 32 IU/L Final  . Hgb A1c MFr Bld 07/26/2015 5.8* 4.8 - 5.6 % Final   Comment:          Pre-diabetes: 5.7 - 6.4          Diabetes: >6.4          Glycemic control for adults with diabetes: <7.0   . Est. average glucose Bld gHb Est-m* 07/26/2015 120   Final  . Cholesterol, Total 07/26/2015 138  100 - 199 mg/dL Final  . Triglycerides 07/26/2015 68  0 - 149 mg/dL Final  . HDL 07/26/2015 64  >39 mg/dL Final   Comment: According to ATP-III Guidelines, HDL-C >59 mg/dL is considered a negative risk factor for CHD.   Marland Kitchen VLDL Cholesterol Cal 07/26/2015 14  5 - 40 mg/dL Final  . LDL Calculated 07/26/2015 60  0 - 99 mg/dL Final  . Chol/HDL Ratio 07/26/2015 2.2  0.0 - 4.4 ratio units Final   Comment:                                   T. Chol/HDL Ratio                                             Men  Women                               1/2 Avg.Risk  3.4    3.3                                    Avg.Risk  5.0    4.4                                2X Avg.Risk  9.6    7.1                                3X Avg.Risk 23.4   11.0   . Vitamin B-12 07/26/2015 >2000* 211 - 946 pg/mL Final  . Vit D, 25-Hydroxy 07/26/2015 73.4  30.0 - 100.0 ng/mL Final   Comment: Vitamin D deficiency has been defined by the [place] practice guideline as a level of serum  25-OH vitamin D less than 20 ng/mL (1,2). The Endocrine Society went on to further define vitamin D insufficiency as a level between 21 and 29 ng/mL (2). 1. IOM (Institute of Medicine). 2010. Dietary reference    intakes for calcium and D. [place]: The    [employer]. 2. Holick MF, Binkley [place], Bischoff-Ferrari HA, et al.    Evaluation, treatment, and prevention of vitamin D    deficiency: an Endocrine Society clinical practice    guideline. JCEM. 2011 Jul; 96(7):1911-30.     No results found.   Assessment/Plan   ICD-9-CM ICD-10-CM   1. Essential hypertension - stable 401.9 I10   2. Type 2 diabetes mellitus with diabetic nephropathy, without long-term current use of insulin (HCC) - stable 250.40 E11.21    583.81    3. CKD (chronic kidney disease), stage 3 (moderate) - stable 585.3 N18.3   4. History of CVA with residual deficit -stable 438.9 I69.30   5. Hyperlipidemia - stable 272.4 E78.5    Continue current medications as ordered  Follow up with specialists as scheduled  Follow up in 3 mos for routine visit. Check labs with next visit  Koleton Duchemin S. Perlie Gold  [facility] [address] [place], [place] [address] [phone] Cell (Monday-Friday 8 AM - 5 PM) [phone] After 5 PM and follow prompts

## 2015-10-07 NOTE — Patient Instructions (Signed)
Continue current medications as ordered  Follow up with specialists as scheduled  Follow up in 3 mos for routine visit

## 2015-10-12 DIAGNOSIS — S46111D Strain of muscle, fascia and tendon of long head of biceps, right arm, subsequent encounter: Secondary | ICD-10-CM | POA: Diagnosis not present

## 2015-10-13 DIAGNOSIS — H353212 Exudative age-related macular degeneration, right eye, with inactive choroidal neovascularization: Secondary | ICD-10-CM | POA: Diagnosis not present

## 2015-10-26 DIAGNOSIS — H353212 Exudative age-related macular degeneration, right eye, with inactive choroidal neovascularization: Secondary | ICD-10-CM | POA: Diagnosis not present

## 2015-11-09 ENCOUNTER — Other Ambulatory Visit: Payer: Self-pay | Admitting: Internal Medicine

## 2015-11-23 DIAGNOSIS — H353212 Exudative age-related macular degeneration, right eye, with inactive choroidal neovascularization: Secondary | ICD-10-CM | POA: Diagnosis not present

## 2015-12-21 DIAGNOSIS — M1711 Unilateral primary osteoarthritis, right knee: Secondary | ICD-10-CM | POA: Diagnosis not present

## 2015-12-28 DIAGNOSIS — M1711 Unilateral primary osteoarthritis, right knee: Secondary | ICD-10-CM | POA: Diagnosis not present

## 2016-01-04 ENCOUNTER — Encounter: Payer: Self-pay | Admitting: Internal Medicine

## 2016-01-04 ENCOUNTER — Ambulatory Visit (INDEPENDENT_AMBULATORY_CARE_PROVIDER_SITE_OTHER): Payer: Medicare Other | Admitting: Internal Medicine

## 2016-01-04 VITALS — BP 128/72 | HR 92 | Temp 98.0°F | Resp 20 | Ht 62.0 in | Wt 129.6 lb

## 2016-01-04 DIAGNOSIS — E785 Hyperlipidemia, unspecified: Secondary | ICD-10-CM

## 2016-01-04 DIAGNOSIS — I10 Essential (primary) hypertension: Secondary | ICD-10-CM

## 2016-01-04 DIAGNOSIS — E1121 Type 2 diabetes mellitus with diabetic nephropathy: Secondary | ICD-10-CM

## 2016-01-04 DIAGNOSIS — S46111A Strain of muscle, fascia and tendon of long head of biceps, right arm, initial encounter: Secondary | ICD-10-CM | POA: Diagnosis not present

## 2016-01-04 DIAGNOSIS — M1711 Unilateral primary osteoarthritis, right knee: Secondary | ICD-10-CM | POA: Diagnosis not present

## 2016-01-04 DIAGNOSIS — M25561 Pain in right knee: Secondary | ICD-10-CM | POA: Diagnosis not present

## 2016-01-04 DIAGNOSIS — I6930 Unspecified sequelae of cerebral infarction: Secondary | ICD-10-CM

## 2016-01-04 DIAGNOSIS — S83241A Other tear of medial meniscus, current injury, right knee, initial encounter: Secondary | ICD-10-CM | POA: Diagnosis not present

## 2016-01-04 NOTE — Patient Instructions (Addendum)
Recommend use a cane (4 prong) when ambulating for better balance  Continue other medications as ordered  Follow up with specialists as scheduled  Follow up in 5 mos for CPE

## 2016-01-04 NOTE — Progress Notes (Signed)
Patient ID: Shirley Sanders, female   DOB: 1935-05-13, 81 y.o.   MRN: [id number]    Location:    [facility]   Place of Service:  OFFICE   Chief Complaint  Patient presents with  . Medical Management of Chronic Issues    3 month follow-up for routine visit    HPI:  81 yo female seen today for f/u. She had RUE torn tendon since last OV. No known injury. She is followed by Dr Joie Bimler at [facility]. She has had right knee pain and had MRI today. She is awaiting results. Ortho c/a meniscal tear. Cortisone injection in knee helped. Tylenol controls pain.   She continues to work 6 hrs per day M-F.   DM - she does not check BS at home. Last A1c 5.8%. No low BS reactions. No numbness or tingling. She takes glipizide  HTN/hyperlipidemia - she takes chlorthalidone and occasionally has leg cramps. Trying to drink more water during the day. She likes Aquafina. LDL 60  Hx CVA - stable on ASA. No new deficits  Osteoporosis - takes vit D3  CKD - stable; takes Vit D3 and B12  She sees eye specialist for macular degeneration. Takes eye vitamins and gets prn steroid injections   Past Medical History  Diagnosis Date  . Hypertension   . Diabetes mellitus without complication ([place])   . Renal disorder   . Stroke [facility])     left sided sensory and motor deficits  . Hyperlipidemia   . Osteoporosis     Past Surgical History  Procedure Laterality Date  . Tonsillectomy    . Abdominal hysterectomy      Patient Care Team: Gildardo Cranker, DO as PCP - General (Internal Medicine) Milus Height, MD as Referring Physician (Ophthalmology) Rexene Agent, MD as Attending Physician (Nephrology) Gildardo Cranker, DO as Consulting Physician (Internal Medicine)  Social History   Social History  . Marital Status: Divorced    Spouse Name: N/A  . Number of Children: 2  . 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 Smoking status: Never Smoker   .  Smokeless tobacco: Never Used  . Alcohol Use: No  . Drug Use: No  . Sexual Activity: No   Other Topics Concern  . Not on file   Social History Narrative   Patient lives at home alone, one stories, no pets   Patient right handed   Patient drinks coffee and diet coke   Past profession- Office     reports that she has never smoked. She has never used smokeless tobacco. She reports that she does not drink alcohol or use illicit drugs.  Allergies  Allergen Reactions  . Codeine Other (See Comments)    hbp  . Contrast Media [Iodinated Diagnostic Agents]   . Fish Allergy   . Milk-Related Compounds     Medications: Patient's Medications  New Prescriptions   No medications on file  Previous Medications   AMLODIPINE BESYLATE PO    Take 5 mg by mouth at bedtime.   ASPIRIN EC 81 MG TABLET    Take 1 tablet (81 mg total) by mouth daily.   CETIRIZINE (ZYRTEC) 10 MG TABLET    Take 10 mg by mouth daily.   CHLORTHALIDONE PO    Take 12.5 mg by mouth every morning.   CHOLECALCIFEROL (VITAMIN D3) 3000 UNITS TABS    Take 1 tablet by mouth daily.  GLIPIZIDE (GLUCOTROL XL) 5 MG 24 HR TABLET    TAKE ONE TABLET BY MOUTH ONCE DAILY WITH BREAKFAST FOR DIABETES   LISINOPRIL (PRINIVIL,ZESTRIL) 2.5 MG TABLET    TAKE ONE TABLET BY MOUTH DAILY   MULTIPLE VITAMINS-MINERALS (PRESERVISION AREDS) CAPS    Take 2 capsules by mouth 2 (two) times daily.   POTASSIUM CHLORIDE SA (K-DUR,KLOR-CON) 20 MEQ TABLET    Take 20 mEq by mouth daily.   PYRIDOXINE HCL (B-6 PO)    Take 100 mg by mouth daily.   SIMVASTATIN (ZOCOR) 10 MG TABLET    TAKE ONE TABLET BY MOUTH AT BEDTIME FOR  ELEVATED  CHOLESTEROL.   ZOSTER VACCINE LIVE, PF, (ZOSTAVAX) [address] UNT/0.65ML INJECTION    Inject 19,400 Units into the skin once.  Modified Medications   No medications on file  Discontinued Medications   No medications on file    Review of Systems  Respiratory: Negative for choking.   Musculoskeletal: Positive for arthralgias and gait  problem.  Neurological: Positive for weakness.  All other systems reviewed and are negative.   Filed Vitals:   01/04/16 1610  BP: 128/72  Pulse: 92  Temp: 98 F (36.7 C)  TempSrc: Oral  Resp: 20  Height: 5\' 2"  (1.575 m)  Weight: 129 lb 9.6 oz (58.786 kg)  SpO2: 94%   Body mass index is 23.7 kg/(m^2).  Physical Exam  Constitutional: She is oriented to person, place, and time. She appears well-developed and well-nourished.  HENT:  Mouth/Throat: Oropharynx is clear and moist. No oropharyngeal exudate.  Eyes: Pupils are equal, round, and reactive to light. No scleral icterus.  Neck: Neck supple. Carotid bruit is not present. No tracheal deviation present. No thyromegaly present.  Cardiovascular: Normal rate, regular rhythm, normal heart sounds and intact distal pulses.  Exam reveals no gallop and no friction rub.   No murmur heard. No LE edema b/l. no calf TTP.   Pulmonary/Chest: Effort normal and breath sounds normal. No stridor. No respiratory distress. She has no wheezes. She has no rales.  Abdominal: Soft. Bowel sounds are normal. She exhibits no distension and no mass. There is no hepatomegaly. There is no tenderness. There is no rebound and no guarding.  Musculoskeletal: She exhibits edema and tenderness.       Right upper arm: She exhibits deformity (biceps bulge).  Right knee swelling with antalgic gait and reduced knee ROM; right posterior shoulder/coracoid process TTP/biceps insertion tendon TTP with reduced ROM  Lymphadenopathy:    She has no cervical adenopathy.  Neurological: She is alert and oriented to person, place, and time.  Left hemiparesis  Skin: Skin is warm and dry. No rash noted.  Psychiatric: She has a normal mood and affect. Her behavior is normal. Thought content normal.     Labs reviewed: No visits with results within 3 Month(s) from this visit. Latest known visit with results is:  Appointment on 07/26/2015  Component Date Value Ref Range Status    . Glucose 07/26/2015 105* 65 - 99 mg/dL Final  . BUN 07/26/2015 21  8 - 27 mg/dL Final  . Creatinine, Ser 07/26/2015 1.19* 0.57 - 1.00 mg/dL Final  . GFR calc non Af Amer 07/26/2015 44* >59 mL/min/1.73 Final  . GFR calc Af Amer 07/26/2015 51* >59 mL/min/1.73 Final  . BUN/Creatinine Ratio 07/26/2015 18  11 - 26 Final  . Sodium 07/26/2015 135* 136 - 144 mmol/L Final                 **  Please note reference interval change**  . Potassium 07/26/2015 4.0  3.5 - 5.2 mmol/L Final                 **Please note reference interval change**  . Chloride 07/26/2015 93* 97 - 106 mmol/L Final                 **Please note reference interval change**  . CO2 07/26/2015 25  18 - 29 mmol/L Final  . Calcium 07/26/2015 9.3  8.7 - 10.3 mg/dL Final  . Total Protein 07/26/2015 6.7  6.0 - 8.5 g/dL Final  . Albumin 07/26/2015 4.5  3.5 - 4.8 g/dL Final  . Globulin, Total 07/26/2015 2.2  1.5 - 4.5 g/dL Final  . Albumin/Globulin Ratio 07/26/2015 2.0  1.1 - 2.5 Final  . Bilirubin Total 07/26/2015 0.3  0.0 - 1.2 mg/dL Final  . Alkaline Phosphatase 07/26/2015 73  39 - 117 IU/L Final  . AST 07/26/2015 25  0 - 40 IU/L Final  . ALT 07/26/2015 18  0 - 32 IU/L Final  . Hgb A1c MFr Bld 07/26/2015 5.8* 4.8 - 5.6 % Final   Comment:          Pre-diabetes: 5.7 - 6.4          Diabetes: >6.4          Glycemic control for adults with diabetes: <7.0   . Est. average glucose Bld gHb Est-m* 07/26/2015 120   Final  . Cholesterol, Total 07/26/2015 138  100 - 199 mg/dL Final  . Triglycerides 07/26/2015 68  0 - 149 mg/dL Final  . HDL 07/26/2015 64  >39 mg/dL Final   Comment: According to ATP-III Guidelines, HDL-C >59 mg/dL is considered a negative risk factor for CHD.   Marland Kitchen VLDL Cholesterol Cal 07/26/2015 14  5 - 40 mg/dL Final  . LDL Calculated 07/26/2015 60  0 - 99 mg/dL Final  . Chol/HDL Ratio 07/26/2015 2.2  0.0 - 4.4 ratio units Final   Comment:                                   T. Chol/HDL Ratio                                              Men  Women                               1/2 Avg.Risk  3.4    3.3                                   Avg.Risk  5.0    4.4                                2X Avg.Risk  9.6    7.1                                3X Avg.Risk 23.4   11.0   . Vitamin B-12 07/26/2015 >2000* 211 - 946 pg/mL Final  . Vit D,  25-Hydroxy 07/26/2015 73.4  30.0 - 100.0 ng/mL Final   Comment: Vitamin D deficiency has been defined by the [place] practice guideline as a level of serum 25-OH vitamin D less than 20 ng/mL (1,2). The Endocrine Society went on to further define vitamin D insufficiency as a level between 21 and 29 ng/mL (2). 1. IOM (Institute of Medicine). 2010. Dietary reference    intakes for calcium and D. [place]: The    [employer]. 2. Holick MF, Binkley [place], Bischoff-Ferrari HA, et al.    Evaluation, treatment, and prevention of vitamin D    deficiency: an Endocrine Society clinical practice    guideline. JCEM. 2011 Jul; 96(7):1911-30.     No results found.   Assessment/Plan   ICD-9-CM ICD-10-CM   1. Biceps tendon tear, right, initial encounter 840.8 S46.111A   2. Right knee pain 719.46 M25.561   3. Essential hypertension 401.9 I10 CBC with Differential  4. Type 2 diabetes mellitus with diabetic nephropathy, without long-term current use of insulin (HCC) 250.40 E11.21 CMP   583.81  CBC with Differential     Hemoglobin A1c  5. History of CVA with residual deficit 438.9 I69.30   6. Hyperlipidemia 272.4 E78.5 Lipid Panel     TSH   Recommend use a cane (4 prong) when ambulating for better balance  Continue other medications as ordered  Follow up with specialists as scheduled  Follow up in 5 mos for CPE  [facility] S. Perlie Gold  [facility] and [facility] [address] [place], [place] [address] [phone] Cell (Monday-Friday 8 AM - 5 PM) [phone] After 5 PM and follow prompts

## 2016-01-09 DIAGNOSIS — M1711 Unilateral primary osteoarthritis, right knee: Secondary | ICD-10-CM | POA: Diagnosis not present

## 2016-01-11 DIAGNOSIS — H353212 Exudative age-related macular degeneration, right eye, with inactive choroidal neovascularization: Secondary | ICD-10-CM | POA: Diagnosis not present

## 2016-01-17 ENCOUNTER — Encounter: Payer: Self-pay | Admitting: Internal Medicine

## 2016-01-18 ENCOUNTER — Other Ambulatory Visit: Payer: Self-pay

## 2016-01-23 DIAGNOSIS — H4089 Other specified glaucoma: Secondary | ICD-10-CM | POA: Diagnosis not present

## 2016-01-23 NOTE — Telephone Encounter (Signed)
I called [facility] @ [phone] and left message requesting for Surgery Clearance to be re-faxed to our office (I left fax and phone number).  I called patient's daughter to inform her we have not received paperwork, I gave her a status update that I called and requested for information to be re-faxed to our office.

## 2016-01-24 NOTE — Telephone Encounter (Signed)
We received paper work from [place] regarding Medical Clearance....this information will be given to Dr. Eulas Post for review if appointment is needed.... [facility]   01/24/2016

## 2016-02-22 DIAGNOSIS — H2513 Age-related nuclear cataract, bilateral: Secondary | ICD-10-CM | POA: Diagnosis not present

## 2016-02-27 ENCOUNTER — Encounter: Payer: Self-pay | Admitting: *Deleted

## 2016-02-27 DIAGNOSIS — H353212 Exudative age-related macular degeneration, right eye, with inactive choroidal neovascularization: Secondary | ICD-10-CM | POA: Diagnosis not present

## 2016-03-01 ENCOUNTER — Ambulatory Visit: Payer: Medicare Other | Admitting: Certified Registered Nurse Anesthetist

## 2016-03-01 ENCOUNTER — Encounter
Admission: RE | Disposition: A | Discharge status: Home / Self Care | Payer: Self-pay | Source: Ambulatory Visit | Attending: Ophthalmology

## 2016-03-01 ENCOUNTER — Ambulatory Visit
Admission: RE | Admit: 2016-03-01 | Discharge: 2016-03-01 | Disposition: A | Discharge status: Home / Self Care | Payer: Medicare Other | Source: Ambulatory Visit | Attending: Ophthalmology | Admitting: Ophthalmology

## 2016-03-01 ENCOUNTER — Encounter: Payer: Self-pay | Admitting: *Deleted

## 2016-03-01 DIAGNOSIS — M810 Age-related osteoporosis without current pathological fracture: Secondary | ICD-10-CM | POA: Diagnosis not present

## 2016-03-01 DIAGNOSIS — K449 Diaphragmatic hernia without obstruction or gangrene: Secondary | ICD-10-CM | POA: Insufficient documentation

## 2016-03-01 DIAGNOSIS — K219 Gastro-esophageal reflux disease without esophagitis: Secondary | ICD-10-CM | POA: Diagnosis not present

## 2016-03-01 DIAGNOSIS — Z91041 Radiographic dye allergy status: Secondary | ICD-10-CM | POA: Insufficient documentation

## 2016-03-01 DIAGNOSIS — I10 Essential (primary) hypertension: Secondary | ICD-10-CM | POA: Diagnosis not present

## 2016-03-01 DIAGNOSIS — R011 Cardiac murmur, unspecified: Secondary | ICD-10-CM | POA: Diagnosis not present

## 2016-03-01 DIAGNOSIS — H2513 Age-related nuclear cataract, bilateral: Secondary | ICD-10-CM | POA: Diagnosis not present

## 2016-03-01 DIAGNOSIS — Z885 Allergy status to narcotic agent status: Secondary | ICD-10-CM | POA: Insufficient documentation

## 2016-03-01 DIAGNOSIS — E7800 Pure hypercholesterolemia, unspecified: Secondary | ICD-10-CM | POA: Diagnosis not present

## 2016-03-01 DIAGNOSIS — H2512 Age-related nuclear cataract, left eye: Secondary | ICD-10-CM | POA: Insufficient documentation

## 2016-03-01 DIAGNOSIS — E119 Type 2 diabetes mellitus without complications: Secondary | ICD-10-CM | POA: Insufficient documentation

## 2016-03-01 DIAGNOSIS — Z8673 Personal history of transient ischemic attack (TIA), and cerebral infarction without residual deficits: Secondary | ICD-10-CM | POA: Insufficient documentation

## 2016-03-01 HISTORY — DX: Personal history of other diseases of the digestive system: Z87.19

## 2016-03-01 HISTORY — DX: Pneumonia, unspecified organism: J18.9

## 2016-03-01 HISTORY — DX: Cerebral infarction, unspecified: I63.9

## 2016-03-01 HISTORY — DX: Allergy status to unspecified drugs, medicaments and biological substances: Z88.9

## 2016-03-01 HISTORY — DX: Bronchitis, not specified as acute or chronic: J40

## 2016-03-01 HISTORY — DX: Zoster without complications: B02.9

## 2016-03-01 HISTORY — DX: Gastro-esophageal reflux disease without esophagitis: K21.9

## 2016-03-01 HISTORY — DX: Cardiac murmur, unspecified: R01.1

## 2016-03-01 HISTORY — PX: CATARACT EXTRACTION W/PHACO: SHX586

## 2016-03-01 LAB — GLUCOSE, CAPILLARY: Glucose-Capillary: 119 mg/dL — ABNORMAL HIGH (ref 65–99)

## 2016-03-01 SURGERY — PHACOEMULSIFICATION, CATARACT, WITH IOL INSERTION
Anesthesia: Monitor Anesthesia Care | Site: Eye | Laterality: Left | Wound class: Clean

## 2016-03-01 MED ORDER — TETRACAINE HCL 0.5 % OP SOLN
OPHTHALMIC | Status: AC
  Administered 2016-03-01: 1 [drp] via OPHTHALMIC
  Filled 2016-03-01: qty 2

## 2016-03-01 MED ORDER — BSS IO SOLN
INTRAOCULAR | Status: DC | PRN
  Administered 2016-03-01: 15 mL via INTRAOCULAR

## 2016-03-01 MED ORDER — MIDAZOLAM HCL 2 MG/2ML IJ SOLN
INTRAMUSCULAR | Status: DC | PRN
  Administered 2016-03-01: 1 mg via INTRAVENOUS

## 2016-03-01 MED ORDER — EPINEPHRINE HCL 1 MG/ML IJ SOLN
INTRAMUSCULAR | Status: AC
  Filled 2016-03-01: qty 2

## 2016-03-01 MED ORDER — BUPIVACAINE HCL (PF) 0.75 % IJ SOLN
INTRAMUSCULAR | Status: AC
  Filled 2016-03-01: qty 10

## 2016-03-01 MED ORDER — TETRACAINE HCL 0.5 % OP SOLN
1.0000 [drp] | Freq: Once | OPHTHALMIC | Status: AC
  Administered 2016-03-01: 1 [drp] via OPHTHALMIC

## 2016-03-01 MED ORDER — LIDOCAINE HCL (PF) 4 % IJ SOLN
INTRAMUSCULAR | Status: DC | PRN
  Administered 2016-03-01: 09:00:00 via OPHTHALMIC

## 2016-03-01 MED ORDER — ARMC OPHTHALMIC DILATING GEL
1.0000 | OPHTHALMIC | Status: AC
Start: 2016-03-01 — End: 2016-03-01
  Administered 2016-03-01 (×2): 1 via OPHTHALMIC

## 2016-03-01 MED ORDER — BSS IO SOLN
INTRAOCULAR | Status: DC | PRN
  Administered 2016-03-01: 09:00:00 via OPHTHALMIC

## 2016-03-01 MED ORDER — FENTANYL CITRATE (PF) 100 MCG/2ML IJ SOLN
INTRAMUSCULAR | Status: DC | PRN
Start: 2016-03-01 — End: 2016-03-01
  Administered 2016-03-01: 50 ug via INTRAVENOUS

## 2016-03-01 MED ORDER — SODIUM CHLORIDE 0.9 % IV SOLN
INTRAVENOUS | Status: DC
  Administered 2016-03-01: 07:00:00 via INTRAVENOUS

## 2016-03-01 MED ORDER — ARMC OPHTHALMIC DILATING GEL
OPHTHALMIC | Status: AC
  Administered 2016-03-01: 1 via OPHTHALMIC
  Filled 2016-03-01: qty 0.25

## 2016-03-01 MED ORDER — POVIDONE-IODINE 5 % OP SOLN
OPHTHALMIC | Status: AC
  Administered 2016-03-01: 1 via OPHTHALMIC
  Filled 2016-03-01: qty 30

## 2016-03-01 MED ORDER — MOXIFLOXACIN HCL 0.5 % OP SOLN
OPHTHALMIC | Status: AC
  Filled 2016-03-01: qty 3

## 2016-03-01 MED ORDER — CEFUROXIME OPHTHALMIC INJECTION 1 MG/0.1 ML
INJECTION | OPHTHALMIC | Status: AC
  Filled 2016-03-01: qty 0.1

## 2016-03-01 MED ORDER — ARMC OPHTHALMIC DILATING GEL: 1.0000 "application " | OPHTHALMIC | Status: DC

## 2016-03-01 MED ORDER — POVIDONE-IODINE 5 % OP SOLN
1.0000 "application " | Freq: Once | OPHTHALMIC | Status: AC
  Administered 2016-03-01: 1 via OPHTHALMIC

## 2016-03-01 MED ORDER — NA HYALUR & NA CHOND-NA HYALUR 0.55-0.5 ML IO KIT
PACK | INTRAOCULAR | Status: AC
  Filled 2016-03-01: qty 1.05

## 2016-03-01 MED ORDER — CEFUROXIME OPHTHALMIC INJECTION 1 MG/0.1 ML
INJECTION | OPHTHALMIC | Status: DC | PRN
  Administered 2016-03-01: 0.1 mL via INTRACAMERAL

## 2016-03-01 MED ORDER — LIDOCAINE HCL (PF) 4 % IJ SOLN
INTRAMUSCULAR | Status: AC
  Filled 2016-03-01: qty 5

## 2016-03-01 MED ORDER — MOXIFLOXACIN HCL 0.5 % OP SOLN
OPHTHALMIC | Status: DC | PRN
  Administered 2016-03-01: 1 [drp] via OPHTHALMIC

## 2016-03-01 MED ORDER — NA HYALUR & NA CHOND-NA HYALUR 0.55-0.5 ML IO KIT
PACK | INTRAOCULAR | Status: DC | PRN
  Administered 2016-03-01: 1 via OPHTHALMIC

## 2016-03-01 SURGICAL SUPPLY — 23 items
CANNULA ANT/CHMB 27G (MISCELLANEOUS) ×1 IMPLANT
CANNULA ANT/CHMB 27GA (MISCELLANEOUS) ×3 IMPLANT
CUP MEDICINE 2OZ PLAST GRAD ST (MISCELLANEOUS) ×3 IMPLANT
GLOVE BIO SURGEON STRL SZ8 (GLOVE) ×3 IMPLANT
GLOVE BIOGEL M 6.5 STRL (GLOVE) ×3 IMPLANT
GLOVE SURG LX 7.5 STRW (GLOVE) ×2
GLOVE SURG LX STRL 7.5 STRW (GLOVE) ×1 IMPLANT
GOWN STRL REUS W/ TWL LRG LVL3 (GOWN DISPOSABLE) ×2 IMPLANT
GOWN STRL REUS W/TWL LRG LVL3 (GOWN DISPOSABLE) ×6
LENS IOL ACRSF IQ PC 22.5 (Intraocular Lens) ×1 IMPLANT
LENS IOL ACRYSOF IQ POST 22.5 (Intraocular Lens) ×3 IMPLANT
PACK CATARACT (MISCELLANEOUS) ×3 IMPLANT
PACK CATARACT BRASINGTON LX (MISCELLANEOUS) ×3 IMPLANT
PACK EYE AFTER SURG (MISCELLANEOUS) ×3 IMPLANT
SOL BSS BAG (MISCELLANEOUS) ×3
SOL PREP PVP 2OZ (MISCELLANEOUS) ×3
SOLUTION BSS BAG (MISCELLANEOUS) ×1 IMPLANT
SOLUTION PREP PVP 2OZ (MISCELLANEOUS) ×1 IMPLANT
SYR 3ML LL SCALE MARK (SYRINGE) ×3 IMPLANT
SYR 5ML LL (SYRINGE) ×3 IMPLANT
SYR TB 1ML 27GX1/2 LL (SYRINGE) ×3 IMPLANT
WATER STERILE IRR 1000ML POUR (IV SOLUTION) ×3 IMPLANT
WIPE NON LINTING 3.25X3.25 (MISCELLANEOUS) ×3 IMPLANT

## 2016-03-01 NOTE — Anesthesia Preprocedure Evaluation (Signed)
Anesthesia Evaluation  Patient identified by MRN, date of birth, ID band Patient awake    Reviewed: Allergy & Precautions, NPO status , Patient's Chart, lab work & pertinent test results, reviewed documented beta blocker date and time   Airway Mallampati: II  TM Distance: >3 FB     Dental  (+) Chipped   Pulmonary pneumonia, resolved,           Cardiovascular hypertension, Pt. on medications      Neuro/Psych CVA, Residual Symptoms    GI/Hepatic hiatal hernia, GERD  Controlled,  Endo/Other  diabetes, Type 2  Renal/GU Renal InsufficiencyRenal disease     Musculoskeletal   Abdominal   Peds  Hematology   Anesthesia Other Findings   Reproductive/Obstetrics                             Anesthesia Physical Anesthesia Plan  ASA: III  Anesthesia Plan: MAC   Post-op Pain Management:    Induction:   Airway Management Planned:   Additional Equipment:   Intra-op Plan:   Post-operative Plan:   Informed Consent: I have reviewed the patients History and Physical, chart, labs and discussed the procedure including the risks, benefits and alternatives for the proposed anesthesia with the patient or authorized representative who has indicated his/her understanding and acceptance.     Plan Discussed with: CRNA  Anesthesia Plan Comments:         Anesthesia Quick Evaluation

## 2016-03-01 NOTE — Transfer of Care (Signed)
Immediate Anesthesia Transfer of Care Note  Patient: Shirley Sanders  Procedure(s) Performed: Procedure(s) with comments: CATARACT EXTRACTION PHACO AND INTRAOCULAR LENS PLACEMENT (IOC) (Left) - [place] 1.09 AP% 14.7 CDE 10.13 FLUID BAG LOT # [id number]  Patient Location: Short Stay  Anesthesia Type:MAC  Level of Consciousness: awake  Airway & Oxygen Therapy: Patient Spontanous Breathing  Post-op Assessment: Post -op Vital signs reviewed and stable  Post vital signs: Reviewed  Last Vitals:  Filed Vitals:   03/01/16 0643 03/01/16 0857  BP: 136/65 120/65  Pulse: 93 88  Temp: 36.4 C 36.8 C  Resp: 20 18    Last Pain: There were no vitals filed for this visit.       Complications: No apparent anesthesia complications

## 2016-03-01 NOTE — Discharge Instructions (Signed)
Eye Surgery Discharge Instructions  Expect mild scratchy sensation or mild soreness. DO NOT RUB YOUR EYE!  The day of surgery:  Minimal physical activity, but bed rest is not required  No reading, computer work, or close hand work  No bending, lifting, or straining.  May watch TV  For 24 hours:  No driving, legal decisions, or alcoholic beverages  Safety precautions  Eat anything you prefer: It is better to start with liquids, then soup then solid foods.  _____ Eye patch should be worn until postoperative exam tomorrow.  ____ Solar shield eyeglasses should be worn for comfort in the sunlight/patch while sleeping  Resume all regular medications including aspirin or Coumadin if these were discontinued prior to surgery. You may shower, bathe, shave, or wash your hair. Tylenol may be taken for mild discomfort.  Call your doctor if you experience significant pain, nausea, or vomiting, fever > 101 or other signs of infection. [phone] or [phone] Specific instructions:  Follow-up Information    Follow up with Benay Pillow, MD.   Specialty:  Ophthalmology   Why:   at 9:40 03-02-16   Contact information:   [address] [place] [address] [phone]      Eye Surgery Discharge Instructions  Expect mild scratchy sensation or mild soreness. DO NOT RUB YOUR EYE!  The day of surgery:  Minimal physical activity, but bed rest is not required  No reading, computer work, or close hand work  No bending, lifting, or straining.  May watch TV  For 24 hours:  No driving, legal decisions, or alcoholic beverages  Safety precautions  Eat anything you prefer: It is better to start with liquids, then soup then solid foods.  _____ Eye patch should be worn until postoperative exam tomorrow.  ____ Solar shield eyeglasses should be worn for comfort in the sunlight/patch while sleeping  Resume all regular medications including aspirin or Coumadin if these were  discontinued prior to surgery. You may shower, bathe, shave, or wash your hair. Tylenol may be taken for mild discomfort.  Call your doctor if you experience significant pain, nausea, or vomiting, fever > 101 or other signs of infection. [phone] or [phone] Specific instructions:  Follow-up Information    Follow up with Benay Pillow, MD.   Specialty:  Ophthalmology   Why:   at 9:40 03-02-16   Contact information:   [address] [place] [address] [phone]

## 2016-03-01 NOTE — Anesthesia Procedure Notes (Signed)
Procedure Name: MAC Performed by: Nattie Lazenby Pre-anesthesia Checklist: Patient identified, Emergency Drugs available, Suction available, Patient being monitored and Timeout performed Oxygen Delivery Method: Nasal cannula       

## 2016-03-01 NOTE — H&P (Signed)
  The History and Physical notes are on paper, have been signed, and are to be scanned. The patient remains stable and unchanged from the H&P.   Previous H&P reviewed, patient examined, and there are no changes.  Benay Pillow 03/01/2016 8:16 AM

## 2016-03-01 NOTE — Op Note (Signed)
OPERATIVE NOTE  Shirley Sanders [id number] 03/01/2016   PREOPERATIVE DIAGNOSIS:  Nuclear sclerotic cataract left eye.  H25.12   POSTOPERATIVE DIAGNOSIS:    Nuclear sclerotic cataract left eye.     PROCEDURE:  Phacoemusification with posterior chamber intraocular lens placement of the left eye   LENS:   Implant Name Type Inv. Item Serial No. Manufacturer Lot No. LRB No. Used  IMPLANT LENS - [id number] Intraocular Lens IMPLANT LENS [id number] ALCON   Left 1       [id number] 22.5 SN [id number] EXP 07/2019   ULTRASOUND TIME: 1 minutes 09 seconds.  CDE 10.13   SURGEON:  Benay Pillow, MD, MPH   ANESTHESIA:  Topical with tetracaine drops and 2% Xylocaine jelly, augmented with 1% preservative-free intracameral lidocaine.   COMPLICATIONS:  None.   DESCRIPTION OF PROCEDURE:  The patient was identified in the holding room and transported to the operating room and placed in the supine position under the operating microscope.  The left eye was identified as the operative eye and it was prepped and draped in the usual sterile ophthalmic fashion.   A 1.0 millimeter clear-corneal paracentesis was made at the 5:00 position. 0.5 ml of preservative-free 1% lidocaine with epinephrine was injected into the anterior chamber.  The anterior chamber was filled with Viscoat viscoelastic.  A 2.4 millimeter keratome was used to make a near-clear corneal incision at the 2:00 position.  A curvilinear capsulorrhexis was made with a cystotome and capsulorrhexis forceps.  Balanced salt solution was used to hydrodissect and hydrodelineate the nucleus.   Phacoemulsification was then used in stop and chop fashion to remove the lens nucleus and epinucleus.  The remaining cortex was then removed using the irrigation and aspiration handpiece. Provisc was then placed into the capsular bag to distend it for lens placement.  A lens was then injected into the capsular bag.  The remaining viscoelastic was aspirated.   Wounds  were hydrated with balanced salt solution.  The anterior chamber was inflated to a physiologic pressure with balanced salt solution. No wound leaks were noted.  Topical Vigamox drops were applied to the eye.  The patient was taken to the recovery room in stable condition without complications of anesthesia or surgery  Benay Pillow 03/01/2016, 8:56 AM

## 2016-03-01 NOTE — Anesthesia Postprocedure Evaluation (Signed)
Anesthesia Post Note  Patient: Shirley Sanders  Procedure(s) Performed: Procedure(s) (LRB): CATARACT EXTRACTION PHACO AND INTRAOCULAR LENS PLACEMENT (IOC) (Left)  Patient location during evaluation: Short Stay Anesthesia Type: MAC Level of consciousness: awake and alert Pain management: pain level controlled Vital Signs Assessment: post-procedure vital signs reviewed and stable Respiratory status: spontaneous breathing Cardiovascular status: blood pressure returned to baseline Postop Assessment: no headache Anesthetic complications: no    Last Vitals:  Filed Vitals:   03/01/16 0643 03/01/16 0857  BP: 136/65 120/65  Pulse: 93 89  Temp: 36.4 C 36.8 C  Resp: 20 16    Last Pain: There were no vitals filed for this visit.               Rolla Plate P

## 2016-03-15 DIAGNOSIS — H2511 Age-related nuclear cataract, right eye: Secondary | ICD-10-CM | POA: Diagnosis not present

## 2016-03-19 ENCOUNTER — Encounter: Payer: Self-pay | Admitting: *Deleted

## 2016-03-22 ENCOUNTER — Encounter
Admission: RE | Disposition: A | Discharge status: Home / Self Care | Payer: Self-pay | Source: Ambulatory Visit | Attending: Ophthalmology

## 2016-03-22 ENCOUNTER — Ambulatory Visit: Payer: Medicare Other | Admitting: Anesthesiology

## 2016-03-22 ENCOUNTER — Encounter: Payer: Self-pay | Admitting: *Deleted

## 2016-03-22 ENCOUNTER — Ambulatory Visit
Admission: RE | Admit: 2016-03-22 | Discharge: 2016-03-22 | Disposition: A | Discharge status: Home / Self Care | Payer: Medicare Other | Source: Ambulatory Visit | Attending: Ophthalmology | Admitting: Ophthalmology

## 2016-03-22 DIAGNOSIS — I10 Essential (primary) hypertension: Secondary | ICD-10-CM | POA: Insufficient documentation

## 2016-03-22 DIAGNOSIS — H2511 Age-related nuclear cataract, right eye: Secondary | ICD-10-CM | POA: Diagnosis not present

## 2016-03-22 DIAGNOSIS — K449 Diaphragmatic hernia without obstruction or gangrene: Secondary | ICD-10-CM | POA: Insufficient documentation

## 2016-03-22 DIAGNOSIS — E119 Type 2 diabetes mellitus without complications: Secondary | ICD-10-CM | POA: Insufficient documentation

## 2016-03-22 DIAGNOSIS — K219 Gastro-esophageal reflux disease without esophagitis: Secondary | ICD-10-CM | POA: Diagnosis not present

## 2016-03-22 DIAGNOSIS — I6930 Unspecified sequelae of cerebral infarction: Secondary | ICD-10-CM | POA: Insufficient documentation

## 2016-03-22 HISTORY — PX: CATARACT EXTRACTION W/PHACO: SHX586

## 2016-03-22 LAB — GLUCOSE, CAPILLARY: Glucose-Capillary: 116 mg/dL — ABNORMAL HIGH (ref 65–99)

## 2016-03-22 SURGERY — PHACOEMULSIFICATION, CATARACT, WITH IOL INSERTION
Anesthesia: Monitor Anesthesia Care | Site: Eye | Laterality: Right | Wound class: Clean

## 2016-03-22 MED ORDER — EPINEPHRINE HCL 1 MG/ML IJ SOLN
INTRAOCULAR | Status: DC | PRN
  Administered 2016-03-22: 250 mL via OPHTHALMIC

## 2016-03-22 MED ORDER — SODIUM HYALURONATE 10 MG/ML IO SOLN
INTRAOCULAR | Status: DC | PRN
  Administered 2016-03-22: 0.85 mL via INTRAOCULAR

## 2016-03-22 MED ORDER — POVIDONE-IODINE 5 % OP SOLN
OPHTHALMIC | Status: AC
  Administered 2016-03-22: 1 via OPHTHALMIC
  Filled 2016-03-22: qty 30

## 2016-03-22 MED ORDER — NA HYALUR & NA CHOND-NA HYALUR 0.55-0.5 ML IO KIT
PACK | INTRAOCULAR | Status: AC
  Filled 2016-03-22: qty 1.05

## 2016-03-22 MED ORDER — CEFUROXIME OPHTHALMIC INJECTION 1 MG/0.1 ML
INJECTION | OPHTHALMIC | Status: AC
  Filled 2016-03-22: qty 0.1

## 2016-03-22 MED ORDER — SODIUM HYALURONATE 10 MG/ML IO SOLN
INTRAOCULAR | Status: AC
  Filled 2016-03-22: qty 0.85

## 2016-03-22 MED ORDER — EPINEPHRINE HCL 1 MG/ML IJ SOLN
INTRAMUSCULAR | Status: AC
  Filled 2016-03-22: qty 1

## 2016-03-22 MED ORDER — POVIDONE-IODINE 5 % OP SOLN
1.0000 "application " | Freq: Once | OPHTHALMIC | Status: AC
  Administered 2016-03-22: 1 via OPHTHALMIC

## 2016-03-22 MED ORDER — MOXIFLOXACIN HCL 0.5 % OP SOLN
OPHTHALMIC | Status: AC
  Filled 2016-03-22: qty 3

## 2016-03-22 MED ORDER — LIDOCAINE HCL (PF) 1 % IJ SOLN
INTRAMUSCULAR | Status: AC
  Filled 2016-03-22: qty 2

## 2016-03-22 MED ORDER — FENTANYL CITRATE (PF) 100 MCG/2ML IJ SOLN
INTRAMUSCULAR | Status: DC | PRN
  Administered 2016-03-22 (×2): 25 ug via INTRAVENOUS
  Administered 2016-03-22: 50 ug via INTRAVENOUS

## 2016-03-22 MED ORDER — NA CHONDROIT SULF-NA HYALURON 40-17 MG/ML IO SOLN
INTRAOCULAR | Status: DC | PRN
  Administered 2016-03-22: 1 mL via INTRAOCULAR

## 2016-03-22 MED ORDER — LIDOCAINE HCL (PF) 4 % IJ SOLN
INTRAMUSCULAR | Status: AC
  Filled 2016-03-22: qty 5

## 2016-03-22 MED ORDER — TETRACAINE HCL 0.5 % OP SOLN
OPHTHALMIC | Status: AC
  Administered 2016-03-22: 1 [drp] via OPHTHALMIC
  Filled 2016-03-22: qty 2

## 2016-03-22 MED ORDER — CEFUROXIME OPHTHALMIC INJECTION 1 MG/0.1 ML
INJECTION | OPHTHALMIC | Status: DC | PRN
  Administered 2016-03-22: 1 mg via INTRACAMERAL

## 2016-03-22 MED ORDER — MOXIFLOXACIN HCL 0.5 % OP SOLN: 1.0000 [drp] | OPHTHALMIC | Status: DC | PRN

## 2016-03-22 MED ORDER — SODIUM HYALURONATE 23 MG/ML IO SOLN
INTRAOCULAR | Status: DC | PRN
  Administered 2016-03-22: 0.6 mL via INTRAOCULAR

## 2016-03-22 MED ORDER — SODIUM CHLORIDE 0.9 % IV SOLN
INTRAVENOUS | Status: DC
  Administered 2016-03-22: 07:00:00 via INTRAVENOUS

## 2016-03-22 MED ORDER — ARMC OPHTHALMIC DILATING GEL
1.0000 "application " | OPHTHALMIC | Status: AC
  Administered 2016-03-22 (×2): 1 via OPHTHALMIC

## 2016-03-22 MED ORDER — LIDOCAINE HCL (PF) 4 % IJ SOLN
INTRAMUSCULAR | Status: DC | PRN
  Administered 2016-03-22: 4 mL via OPHTHALMIC

## 2016-03-22 MED ORDER — ONDANSETRON HCL 4 MG/2ML IJ SOLN
INTRAMUSCULAR | Status: DC | PRN
  Administered 2016-03-22: 4 mg via INTRAVENOUS

## 2016-03-22 MED ORDER — MIDAZOLAM HCL 2 MG/2ML IJ SOLN
INTRAMUSCULAR | Status: DC | PRN
  Administered 2016-03-22: 1 mg via INTRAVENOUS
  Administered 2016-03-22 (×2): 0.5 mg via INTRAVENOUS

## 2016-03-22 MED ORDER — SODIUM HYALURONATE 23 MG/ML IO SOLN
INTRAOCULAR | Status: AC
  Filled 2016-03-22: qty 0.6

## 2016-03-22 MED ORDER — MOXIFLOXACIN HCL 0.5 % OP SOLN
OPHTHALMIC | Status: DC | PRN
  Administered 2016-03-22: 1 [drp] via OPHTHALMIC

## 2016-03-22 MED ORDER — TETRACAINE HCL 0.5 % OP SOLN
1.0000 [drp] | Freq: Once | OPHTHALMIC | Status: AC
  Administered 2016-03-22: 1 [drp] via OPHTHALMIC

## 2016-03-22 MED ORDER — ARMC OPHTHALMIC DILATING GEL
OPHTHALMIC | Status: AC
  Administered 2016-03-22: 1 via OPHTHALMIC
  Filled 2016-03-22: qty 0.25

## 2016-03-22 SURGICAL SUPPLY — 23 items
CANNULA ANT/CHMB 27G (MISCELLANEOUS) ×1 IMPLANT
CANNULA ANT/CHMB 27GA (MISCELLANEOUS) ×3 IMPLANT
CUP MEDICINE 2OZ PLAST GRAD ST (MISCELLANEOUS) ×3 IMPLANT
GLOVE BIO SURGEON STRL SZ8 (GLOVE) ×3 IMPLANT
GLOVE BIOGEL M 6.5 STRL (GLOVE) ×3 IMPLANT
GLOVE SURG LX 7.5 STRW (GLOVE) ×2
GLOVE SURG LX STRL 7.5 STRW (GLOVE) ×1 IMPLANT
GOWN STRL REUS W/ TWL LRG LVL3 (GOWN DISPOSABLE) ×2 IMPLANT
GOWN STRL REUS W/TWL LRG LVL3 (GOWN DISPOSABLE) ×6
LENS IOL ACRSF IQ PC 20.5 (Intraocular Lens) ×1 IMPLANT
LENS IOL ACRYSOF IQ POST 20.5 (Intraocular Lens) ×3 IMPLANT
PACK CATARACT (MISCELLANEOUS) ×3 IMPLANT
PACK CATARACT BRASINGTON LX (MISCELLANEOUS) ×3 IMPLANT
PACK EYE AFTER SURG (MISCELLANEOUS) ×3 IMPLANT
SOL BSS BAG (MISCELLANEOUS) ×3
SOL PREP PVP 2OZ (MISCELLANEOUS) ×3
SOLUTION BSS BAG (MISCELLANEOUS) ×1 IMPLANT
SOLUTION PREP PVP 2OZ (MISCELLANEOUS) ×1 IMPLANT
SYR 3ML LL SCALE MARK (SYRINGE) ×3 IMPLANT
SYR 5ML LL (SYRINGE) ×3 IMPLANT
SYR TB 1ML 27GX1/2 LL (SYRINGE) ×3 IMPLANT
WATER STERILE IRR 1000ML POUR (IV SOLUTION) ×3 IMPLANT
WIPE NON LINTING 3.25X3.25 (MISCELLANEOUS) ×3 IMPLANT

## 2016-03-22 NOTE — Op Note (Signed)
OPERATIVE NOTE  ETHELDA DULWORTH [id number] 03/22/2016   PREOPERATIVE DIAGNOSIS:  Nuclear sclerotic cataract right eye.  H25.11   POSTOPERATIVE DIAGNOSIS:    Nuclear sclerotic cataract right eye.     PROCEDURE:  Phacoemusification with posterior chamber intraocular lens placement of the right eye   LENS:   Implant Name Type Inv. Item Serial No. Manufacturer Lot No. LRB No. Used  IMPLANT LENS - [id number] Intraocular Lens IMPLANT LENS [id number] ALCON   Right 1       SN60WF 20.5 D IOL   ULTRASOUND TIME: 1 minutes 19.5 seconds.  CDE 16.57   SURGEON:  Benay Pillow, MD, MPH  ANESTHESIOLOGIST: Anesthesiologist: Martha Clan, MD CRNA: Lorie Apley, CRNA   ANESTHESIA:  Topical with tetracaine drops and 2% Xylocaine jelly, augmented with 1% preservative-free intracameral lidocaine.  ESTIMATED BLOOD LOSS: less than 1 mL.   COMPLICATIONS:  None.   DESCRIPTION OF PROCEDURE:  The patient was identified in the holding room and transported to the operating room and placed in the supine position under the operating microscope.  The right eye was identified as the operative eye and it was prepped and draped in the usual sterile ophthalmic fashion.   A 1.0 millimeter clear-corneal paracentesis was made at the 10:30 position. 0.5 ml of preservative-free 1% lidocaine with epinephrine was injected into the anterior chamber.  The anterior chamber was filled with Healon 5 viscoelastic.  A 2.4 millimeter keratome was used to make a near-clear corneal incision at the 8:00 position.  A curvilinear capsulorrhexis was made with a cystotome and capsulorrhexis forceps.  Balanced salt solution was used to hydrodissect and hydrodelineate the nucleus.   Phacoemulsification was then used in stop and chop fashion to remove the lens nucleus and epinucleus.  The remaining cortex was then removed using the irrigation and aspiration handpiece. Healon was then placed into the capsular bag to distend it for lens  placement.  A lens was then injected into the capsular bag.  The remaining viscoelastic was aspirated.   Wounds were hydrated with balanced salt solution.  The anterior chamber was inflated to a physiologic pressure with balanced salt solution.   Intracameral cefuroxime 0.1 mL at 10mg /mL was injected into the eye.  No wound leaks were noted.  Topical Vigamox drops were applied to the eye.  The patient was taken to the recovery room in stable condition without complications of anesthesia or surgery  Benay Pillow 03/22/2016, 8:21 AM

## 2016-03-22 NOTE — Anesthesia Postprocedure Evaluation (Signed)
Anesthesia Post Note  Patient: Shirley Sanders  Procedure(s) Performed: Procedure(s) (LRB): CATARACT EXTRACTION PHACO AND INTRAOCULAR LENS PLACEMENT (IOC) (Right)  Patient location during evaluation: PACU Anesthesia Type: MAC Level of consciousness: awake and alert Pain management: pain level controlled Vital Signs Assessment: post-procedure vital signs reviewed and stable Respiratory status: spontaneous breathing, nonlabored ventilation, respiratory function stable and patient connected to nasal cannula oxygen Cardiovascular status: blood pressure returned to baseline and stable Postop Assessment: no signs of nausea or vomiting Anesthetic complications: no    Last Vitals:  Filed Vitals:   03/22/16 0642 03/22/16 0815  BP: 133/76 119/66  Pulse: 80 89  Temp: 37.1 C 36.8 C  Resp: 20 20    Last Pain: There were no vitals filed for this visit.               Martha Clan

## 2016-03-22 NOTE — H&P (Signed)
  The History and Physical notes are on paper, have been signed, and are to be scanned. The patient remains stable and unchanged from the H&P.   Previous H&P reviewed, patient examined, and there are no changes.  Benay Pillow 03/22/2016 7:34 AM

## 2016-03-22 NOTE — Transfer of Care (Signed)
Immediate Anesthesia Transfer of Care Note  Patient: EMILSE BOEHNLEIN  Procedure(s) Performed: Procedure(s) with comments: CATARACT EXTRACTION PHACO AND INTRAOCULAR LENS PLACEMENT (IOC) (Right) - Lot# [id number] H [place]: 01:19.5 AP%: 20.8 CDE: 16.57   Patient Location: PACU and Short Stay  Anesthesia Type:MAC  Level of Consciousness: awake, alert  and oriented  Airway & Oxygen Therapy: Patient Spontanous Breathing  Post-op Assessment: Report given to RN, Post -op Vital signs reviewed and stable and Patient moving all extremities  Post vital signs: Reviewed and stable  Last Vitals:  Filed Vitals:   03/19/16 1115 03/22/16 0642  BP: 142/77 133/76  Pulse: 88 80  Temp:  37.1 C  Resp:  20    Last Pain: There were no vitals filed for this visit.    Patients Stated Pain Goal: 0 ([id number])  Complications: No apparent anesthesia complications

## 2016-03-22 NOTE — Anesthesia Preprocedure Evaluation (Signed)
Anesthesia Evaluation  Patient identified by MRN, date of birth, ID band Patient awake    Reviewed: Allergy & Precautions, NPO status , Patient's Chart, lab work & pertinent test results, reviewed documented beta blocker date and time   Airway Mallampati: II  TM Distance: >3 FB     Dental  (+) Chipped   Pulmonary pneumonia, resolved,           Cardiovascular hypertension, Pt. on medications      Neuro/Psych CVA, Residual Symptoms    GI/Hepatic hiatal hernia, GERD  Controlled,  Endo/Other  diabetes, Type 2  Renal/GU Renal InsufficiencyRenal disease     Musculoskeletal   Abdominal   Peds  Hematology   Anesthesia Other Findings   Reproductive/Obstetrics                             Anesthesia Physical  Anesthesia Plan  ASA: III  Anesthesia Plan: MAC   Post-op Pain Management:    Induction:   Airway Management Planned:   Additional Equipment:   Intra-op Plan:   Post-operative Plan:   Informed Consent: I have reviewed the patients History and Physical, chart, labs and discussed the procedure including the risks, benefits and alternatives for the proposed anesthesia with the patient or authorized representative who has indicated his/her understanding and acceptance.     Plan Discussed with: CRNA  Anesthesia Plan Comments:         Anesthesia Quick Evaluation

## 2016-03-22 NOTE — Discharge Instructions (Signed)
Eye Surgery Discharge Instructions  Expect mild scratchy sensation or mild soreness. DO NOT RUB YOUR EYE!  The day of surgery:  Minimal physical activity, but bed rest is not required  No reading, computer work, or close hand work  No bending, lifting, or straining.  May watch TV  For 24 hours:  No driving, legal decisions, or alcoholic beverages  Safety precautions  Eat anything you prefer: It is better to start with liquids, then soup then solid foods.  _____ Eye patch should be worn until postoperative exam tomorrow.  ____ Solar shield eyeglasses should be worn for comfort in the sunlight/patch while sleeping  Resume all regular medications including aspirin or Coumadin if these were discontinued prior to surgery. You may shower, bathe, shave, or wash your hair. Tylenol may be taken for mild discomfort.  Call your doctor if you experience significant pain, nausea, or vomiting, fever > 101 or other signs of infection. [phone] or [phone] Specific instructions:  Follow-up Information    Follow up with Benay Pillow, MD In 1 day.   Specialty:  Ophthalmology   Why:  [place]   Contact information:   [address] [place] [address] [phone]

## 2016-03-26 DIAGNOSIS — S83231A Complex tear of medial meniscus, current injury, right knee, initial encounter: Secondary | ICD-10-CM | POA: Diagnosis not present

## 2016-03-26 DIAGNOSIS — M1711 Unilateral primary osteoarthritis, right knee: Secondary | ICD-10-CM | POA: Diagnosis not present

## 2016-03-26 DIAGNOSIS — M25561 Pain in right knee: Secondary | ICD-10-CM | POA: Diagnosis not present

## 2016-03-29 DIAGNOSIS — E559 Vitamin D deficiency, unspecified: Secondary | ICD-10-CM | POA: Diagnosis not present

## 2016-03-29 DIAGNOSIS — M2341 Loose body in knee, right knee: Secondary | ICD-10-CM | POA: Diagnosis not present

## 2016-03-29 DIAGNOSIS — Z79899 Other long term (current) drug therapy: Secondary | ICD-10-CM | POA: Diagnosis not present

## 2016-03-29 DIAGNOSIS — H3530 Unspecified macular degeneration: Secondary | ICD-10-CM | POA: Diagnosis not present

## 2016-03-29 DIAGNOSIS — Z7984 Long term (current) use of oral hypoglycemic drugs: Secondary | ICD-10-CM | POA: Diagnosis not present

## 2016-03-29 DIAGNOSIS — M659 Synovitis and tenosynovitis, unspecified: Secondary | ICD-10-CM | POA: Diagnosis not present

## 2016-03-29 DIAGNOSIS — Z7982 Long term (current) use of aspirin: Secondary | ICD-10-CM | POA: Diagnosis not present

## 2016-03-29 DIAGNOSIS — S83241A Other tear of medial meniscus, current injury, right knee, initial encounter: Secondary | ICD-10-CM | POA: Diagnosis not present

## 2016-03-29 DIAGNOSIS — E1122 Type 2 diabetes mellitus with diabetic chronic kidney disease: Secondary | ICD-10-CM | POA: Diagnosis not present

## 2016-03-29 DIAGNOSIS — S83281A Other tear of lateral meniscus, current injury, right knee, initial encounter: Secondary | ICD-10-CM | POA: Diagnosis not present

## 2016-03-29 DIAGNOSIS — M1711 Unilateral primary osteoarthritis, right knee: Secondary | ICD-10-CM | POA: Diagnosis not present

## 2016-03-29 DIAGNOSIS — M6751 Plica syndrome, right knee: Secondary | ICD-10-CM | POA: Diagnosis not present

## 2016-03-29 DIAGNOSIS — I129 Hypertensive chronic kidney disease with stage 1 through stage 4 chronic kidney disease, or unspecified chronic kidney disease: Secondary | ICD-10-CM | POA: Diagnosis not present

## 2016-03-29 DIAGNOSIS — M25561 Pain in right knee: Secondary | ICD-10-CM | POA: Diagnosis not present

## 2016-03-29 DIAGNOSIS — M94261 Chondromalacia, right knee: Secondary | ICD-10-CM | POA: Diagnosis not present

## 2016-03-29 DIAGNOSIS — S83231A Complex tear of medial meniscus, current injury, right knee, initial encounter: Secondary | ICD-10-CM | POA: Diagnosis not present

## 2016-03-29 DIAGNOSIS — N189 Chronic kidney disease, unspecified: Secondary | ICD-10-CM | POA: Diagnosis not present

## 2016-03-29 HISTORY — PX: KNEE SURGERY: SHX244

## 2016-04-02 DIAGNOSIS — M6281 Muscle weakness (generalized): Secondary | ICD-10-CM | POA: Diagnosis not present

## 2016-04-02 DIAGNOSIS — S83231D Complex tear of medial meniscus, current injury, right knee, subsequent encounter: Secondary | ICD-10-CM | POA: Diagnosis not present

## 2016-04-02 DIAGNOSIS — R2689 Other abnormalities of gait and mobility: Secondary | ICD-10-CM | POA: Diagnosis not present

## 2016-04-02 DIAGNOSIS — M25561 Pain in right knee: Secondary | ICD-10-CM | POA: Diagnosis not present

## 2016-04-04 ENCOUNTER — Encounter: Payer: Self-pay | Admitting: Internal Medicine

## 2016-04-04 ENCOUNTER — Other Ambulatory Visit: Payer: Self-pay

## 2016-04-04 MED ORDER — LISINOPRIL 2.5 MG PO TABS: 2.5000 mg | ORAL_TABLET | Freq: Every day | ORAL | Status: DC

## 2016-04-05 DIAGNOSIS — S83231D Complex tear of medial meniscus, current injury, right knee, subsequent encounter: Secondary | ICD-10-CM | POA: Diagnosis not present

## 2016-04-05 DIAGNOSIS — R2689 Other abnormalities of gait and mobility: Secondary | ICD-10-CM | POA: Diagnosis not present

## 2016-04-05 DIAGNOSIS — M6281 Muscle weakness (generalized): Secondary | ICD-10-CM | POA: Diagnosis not present

## 2016-04-05 DIAGNOSIS — M25561 Pain in right knee: Secondary | ICD-10-CM | POA: Diagnosis not present

## 2016-04-09 DIAGNOSIS — R2689 Other abnormalities of gait and mobility: Secondary | ICD-10-CM | POA: Diagnosis not present

## 2016-04-09 DIAGNOSIS — M6281 Muscle weakness (generalized): Secondary | ICD-10-CM | POA: Diagnosis not present

## 2016-04-09 DIAGNOSIS — S83231D Complex tear of medial meniscus, current injury, right knee, subsequent encounter: Secondary | ICD-10-CM | POA: Diagnosis not present

## 2016-04-09 DIAGNOSIS — M25561 Pain in right knee: Secondary | ICD-10-CM | POA: Diagnosis not present

## 2016-04-12 DIAGNOSIS — S83231D Complex tear of medial meniscus, current injury, right knee, subsequent encounter: Secondary | ICD-10-CM | POA: Diagnosis not present

## 2016-04-12 DIAGNOSIS — M25561 Pain in right knee: Secondary | ICD-10-CM | POA: Diagnosis not present

## 2016-04-12 DIAGNOSIS — R2689 Other abnormalities of gait and mobility: Secondary | ICD-10-CM | POA: Diagnosis not present

## 2016-04-12 DIAGNOSIS — M6281 Muscle weakness (generalized): Secondary | ICD-10-CM | POA: Diagnosis not present

## 2016-04-16 DIAGNOSIS — H353212 Exudative age-related macular degeneration, right eye, with inactive choroidal neovascularization: Secondary | ICD-10-CM | POA: Diagnosis not present

## 2016-05-01 ENCOUNTER — Other Ambulatory Visit: Payer: Self-pay

## 2016-05-01 DIAGNOSIS — I10 Essential (primary) hypertension: Secondary | ICD-10-CM

## 2016-05-01 DIAGNOSIS — E785 Hyperlipidemia, unspecified: Secondary | ICD-10-CM

## 2016-05-01 DIAGNOSIS — E1121 Type 2 diabetes mellitus with diabetic nephropathy: Secondary | ICD-10-CM

## 2016-05-08 ENCOUNTER — Other Ambulatory Visit: Payer: Medicare Other

## 2016-05-08 DIAGNOSIS — E1121 Type 2 diabetes mellitus with diabetic nephropathy: Secondary | ICD-10-CM | POA: Diagnosis not present

## 2016-05-08 DIAGNOSIS — I10 Essential (primary) hypertension: Secondary | ICD-10-CM

## 2016-05-08 DIAGNOSIS — E785 Hyperlipidemia, unspecified: Secondary | ICD-10-CM

## 2016-05-08 LAB — CBC WITH DIFFERENTIAL/PLATELET
BASOS ABS: 0 {cells}/uL (ref 0–200)
Basophils Relative: 0 %
EOS ABS: 432 {cells}/uL (ref 15–500)
Eosinophils Relative: 9 %
HEMATOCRIT: 39.9 % (ref 35.0–45.0)
HEMOGLOBIN: 13.3 g/dL (ref 11.7–15.5)
LYMPHS ABS: 720 {cells}/uL — AB (ref 850–3900)
Lymphocytes Relative: 15 %
MCH: 29.4 pg (ref 27.0–33.0)
MCHC: 33.3 g/dL (ref 32.0–36.0)
MCV: 88.3 fL (ref 80.0–100.0)
MONO ABS: 432 {cells}/uL (ref 200–950)
MPV: 10 fL (ref 7.5–12.5)
Monocytes Relative: 9 %
NEUTROS PCT: 67 %
Neutro Abs: 3216 cells/uL (ref 1500–7800)
Platelets: 266 10*3/uL (ref 140–400)
RBC: 4.52 MIL/uL (ref 3.80–5.10)
RDW: 13 % (ref 11.0–15.0)
WBC: 4.8 10*3/uL (ref 3.8–10.8)

## 2016-05-08 LAB — COMPLETE METABOLIC PANEL WITH GFR
ALBUMIN: 3.9 g/dL (ref 3.6–5.1)
ALK PHOS: 73 U/L (ref 33–130)
ALT: 15 U/L (ref 6–29)
AST: 20 U/L (ref 10–35)
BUN: 17 mg/dL (ref 7–25)
CALCIUM: 9.3 mg/dL (ref 8.6–10.4)
CO2: 29 mmol/L (ref 20–31)
Chloride: 106 mmol/L (ref 98–110)
Creat: 1.2 mg/dL — ABNORMAL HIGH (ref 0.60–0.93)
GFR, EST NON AFRICAN AMERICAN: 43 mL/min — AB (ref >=60)
GFR, Est African American: 50 mL/min — ABNORMAL LOW (ref >=60)
Glucose, Bld: 125 mg/dL — ABNORMAL HIGH (ref 65–99)
POTASSIUM: 4.2 mmol/L (ref 3.5–5.3)
Sodium: 144 mmol/L (ref 135–146)
Total Bilirubin: 0.4 mg/dL (ref 0.2–1.2)
Total Protein: 6.4 g/dL (ref 6.1–8.1)

## 2016-05-08 LAB — LIPID PANEL
Cholesterol: 143 mg/dL (ref 125–200)
HDL: 59 mg/dL (ref >=46)
LDL CALC: 58 mg/dL (ref <130)
TRIGLYCERIDES: 128 mg/dL (ref <150)
Total CHOL/HDL Ratio: 2.4 Ratio (ref <=5.0)
VLDL: 26 mg/dL (ref <30)

## 2016-05-08 LAB — TSH: TSH: 1.42 m[IU]/L

## 2016-05-08 LAB — HEMOGLOBIN A1C
HEMOGLOBIN A1C: 6.4 % — AB (ref <5.7)
Mean Plasma Glucose: 137 mg/dL

## 2016-05-14 ENCOUNTER — Other Ambulatory Visit: Payer: Medicare Other

## 2016-05-16 ENCOUNTER — Ambulatory Visit (INDEPENDENT_AMBULATORY_CARE_PROVIDER_SITE_OTHER): Payer: Medicare Other | Admitting: Internal Medicine

## 2016-05-16 ENCOUNTER — Encounter: Payer: Self-pay | Admitting: Internal Medicine

## 2016-05-16 VITALS — BP 122/80 | HR 89 | Temp 98.1°F | Ht 60.24 in | Wt 136.0 lb

## 2016-05-16 DIAGNOSIS — N183 Chronic kidney disease, stage 3 (moderate): Secondary | ICD-10-CM

## 2016-05-16 DIAGNOSIS — H3530 Unspecified macular degeneration: Secondary | ICD-10-CM | POA: Diagnosis not present

## 2016-05-16 DIAGNOSIS — N63 Unspecified lump in breast: Secondary | ICD-10-CM

## 2016-05-16 DIAGNOSIS — N6310 Unspecified lump in the right breast, unspecified quadrant: Secondary | ICD-10-CM

## 2016-05-16 DIAGNOSIS — E1121 Type 2 diabetes mellitus with diabetic nephropathy: Secondary | ICD-10-CM

## 2016-05-16 DIAGNOSIS — I6930 Unspecified sequelae of cerebral infarction: Secondary | ICD-10-CM | POA: Diagnosis not present

## 2016-05-16 DIAGNOSIS — I10 Essential (primary) hypertension: Secondary | ICD-10-CM

## 2016-05-16 DIAGNOSIS — E785 Hyperlipidemia, unspecified: Secondary | ICD-10-CM

## 2016-05-16 DIAGNOSIS — L729 Follicular cyst of the skin and subcutaneous tissue, unspecified: Secondary | ICD-10-CM

## 2016-05-16 DIAGNOSIS — Z0000 Encounter for general adult medical examination without abnormal findings: Secondary | ICD-10-CM | POA: Diagnosis not present

## 2016-05-16 NOTE — Patient Instructions (Signed)
Encouraged her to exercise 30-45 minutes 4-5 times per week. Eat a well balanced diet. Avoid smoking. Limit alcohol intake. Wear seatbelt when riding in the car. Wear sun block (SPF >50) when spending extended times outside.  Continue current medications as ordered  Follow up with specialists as scheduled  Follow up in 3 mos for routine visit. Fasting labs prior to visit

## 2016-05-16 NOTE — Progress Notes (Signed)
Patient ID: Shirley Sanders, female   DOB: [birth year], 81 y.o.   MRN: [id number]   Location:  [facility]  Place of Service:  OFFICE  Provider: Arletha Grippe, DO  Patient Care Team: Gildardo Cranker, DO as PCP - General (Internal Medicine) Milus Height, MD as Referring Physician (Ophthalmology) Rexene Agent, MD as Attending Physician (Nephrology) Gildardo Cranker, DO as Consulting Physician (Internal Medicine)  Extended Emergency Contact Information Primary Emergency Contact: Brooks,Kim Address: [address]           [place], [place] [address] Johnnette Litter of [place] Phone: [phone] Mobile Phone: [phone] Relation: Daughter  Code Status: FULL CODE Goals of Care: Advanced Directive information Advanced Directives 05/16/2016  Does patient have an advance directive? No  Would patient like information on creating an advanced directive? No - patient declined information  Pre-existing out of facility DNR order (yellow form or pink MOST form) -     Chief Complaint  Patient presents with  . Annual Exam    Yearly exam, here with daughter  . Other    MMSE 29/30 did not pass clock drawing    HPI: Patient is a 81 y.o. female seen in today for an annual wellness exam.   She had RUE torn tendon since last OV. No known injury. She is followed by Dr Jo[facility]imler at [facility]. She has had right knee pain and had MRI today. She saw ortho and was dx with total of 3 tears (2 were meniscal) and bone fragments. She underwent right knee arthroscopy which has helped. She takes Tylenol ES prn for pain  She continues to work 6 hrs per day M-F.   DM - she does not check BS at home. Last A1c 6.4%. No low BS reactions. No numbness or tingling. She takes glipizide  HTN/hyperlipidemia - she takes chlorthalidone and occasionally has leg cramps. Trying to drink more water during the day. She likes Aquafina. LDL 58  Hx CVA - stable on ASA. No new deficits  Osteoporosis - takes vit D3  CKD - stable;  takes Vit D3 and B12  She sees eye specialist for macular degeneration. Takes eye vitamins and gets prn steroid injections. She OU cats with IOL lenses due to worsening eye pressure. Vision not improved since procedure. She continues to OS increased tearing  Depression screen [facility] 2/9 05/16/2016 04/06/2015 11/24/2014 10/22/2014  Decreased Interest 0 0 0 0  Down, Depressed, Hopeless 0 0 0 0  PHQ - 2 Score 0 0 0 0    Fall Risk  05/16/2016 01/04/2016 10/07/2015 07/08/2015 04/06/2015  Falls in the past year? No No No No No  Number falls in past yr: - - - - -  Injury with Fall? - - - - -   MMSE - [place] Exam 05/16/2016  Orientation to time 5  Orientation to Place 5  Registration 3  Attention/ Calculation 5  Recall 2  Language- name 2 objects 2  Language- repeat 1  Language- follow 3 step command 3  Language- read & follow direction 1  Write a sentence 1  Copy design 1  Total score 29     Health Maintenance  Topic Date Due  . FOOT EXAM  03/31/1947  . TETANUS/TDAP  03/30/1956  . ZOSTAVAX  03/30/1997  . PNA vac Low Risk Adult (2 of 2 - PPSV23) 10/08/2012  . OPHTHALMOLOGY EXAM  03/10/2015  . INFLUENZA VACCINE  05/08/2016  . HEMOGLOBIN A1C  11/08/2016  . DEXA SCAN  Completed    Urinary incontinence? none  Functional Status Survey: Is the patient deaf or have difficulty hearing?: No Does the patient have difficulty seeing, even when wearing glasses/contacts?: Yes Does the patient have difficulty concentrating, remembering, or making decisions?: No Does the patient have difficulty walking or climbing stairs?: Yes (climbing stairs) Does the patient have difficulty dressing or bathing?: No Does the patient have difficulty doing errands alone such as visiting a doctor's office or shopping?: Yes  Exercise? No regular routine  Diet? Attempts to maintain healthy lifestyle  Vision Screening Comments: Patient has eye doctor [address] last exam was 04/16/16  Hearing:  yes    Dentition: she has not seen dentist in about 1 yr  Pain: stable with prn tylenol  Past Medical History:  Diagnosis Date  . Bronchitis   . CVA (cerebral infarction)    x 4  . Diabetes mellitus without complication ([place])   . GERD (gastroesophageal reflux disease)   . History of hiatal hernia   . Hyperlipidemia   . Hypertension   . Multiple allergies   . Murmur   . Osteoporosis   . Pneumonia   . Renal disorder   . Shingles   . Shingles   . Stroke [facility])    left sided sensory and motor deficits    Past Surgical History:  Procedure Laterality Date  . ABDOMINAL HYSTERECTOMY    . CATARACT EXTRACTION Bilateral 02/23/2016, 03/22/2016  . CATARACT EXTRACTION W/PHACO Left 03/01/2016   Procedure: CATARACT EXTRACTION PHACO AND INTRAOCULAR LENS PLACEMENT (IOC);  Surgeon: Eulogio Bear, MD;  Location: [facility] ORS;  Service: Ophthalmology;  Laterality: Left;  [place] 1.09 AP% 14.7 CDE 10.13 FLUID BAG LOT # [id number]  . CATARACT EXTRACTION W/PHACO Right 03/22/2016   Procedure: CATARACT EXTRACTION PHACO AND INTRAOCULAR LENS PLACEMENT ([place]);  Surgeon: Eulogio Bear, MD;  Location: [facility] ORS;  Service: Ophthalmology;  Laterality: Right;  Lot# [id number] H [place]: 01:19.5 AP%: 20.8 CDE: 16.57   . kidney stones    . KNEE SURGERY Right 03/29/2016  . TONSILLECTOMY      Family History  Problem Relation Age of Onset  . Stroke Mother   . Hypertension Father   . Kidney disease Father   . Diabetes Father   . Cancer Father   . Diabetes Sister   . Cancer Sister     lung  . Stroke Sister   . Heart disease Brother   . Diabetes Brother   . Heart disease Brother   . Stroke Brother   . Diabetes Brother   . Stroke Brother    Family Status  Relation Status  . Mother Deceased  . Father Deceased  . Sister Deceased  . Brother Alive  . Brother Deceased  . Daughter Alive  . Son Alive  . Brother Deceased    Social History   Social History  . Marital status: Divorced    Spouse name: N/A   . Number of children: 2  . 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 Smoking status: Never Smoker  . Smokeless tobacco: Never Used  . Alcohol use No  . Drug use: No  . Sexual activity: No   Other Topics Concern  . Not on file   Social History Narrative   Patient lives at home alone, one stories, no pets   Patient right handed   Patient drinks coffee and diet coke   Past profession-  Office    Allergies  Allergen Reactions  . Codeine Other (See Comments)    hbp  . Contrast Media [Iodinated Diagnostic Agents]   . Fish Allergy   . Milk-Related Compounds       Medication List       Accurate as of 05/16/16  3:35 PM. Always use your most recent med list.          aspirin EC 81 MG tablet Take 1 tablet (81 mg total) by mouth daily.   cetirizine 10 MG tablet Commonly known as:  ZYRTEC Take 10 mg by mouth daily.   CHLORTHALIDONE PO Take 12.5 mg by mouth every morning.   glipiZIDE 5 MG 24 hr tablet Commonly known as:  GLUCOTROL XL TAKE ONE TABLET BY MOUTH ONCE DAILY WITH BREAKFAST FOR DIABETES   lisinopril 2.5 MG tablet Commonly known as:  PRINIVIL,ZESTRIL Take 1 tablet (2.5 mg total) by mouth daily.   potassium chloride SA 20 MEQ tablet Commonly known as:  K-DUR,KLOR-CON Take 20 mEq by mouth at bedtime.   PRESERVISION AREDS Caps Take 2 capsules by mouth 2 (two) times daily.   simvastatin 10 MG tablet Commonly known as:  ZOCOR TAKE ONE TABLET BY MOUTH AT BEDTIME FOR  ELEVATED  CHOLESTEROL.   vitamin B-12 1000 MCG tablet Commonly known as:  CYANOCOBALAMIN Take 1,000 mcg by mouth daily.   VITAMIN D (CHOLECALCIFEROL) PO Take 1 capsule by mouth daily.   zoster vaccine live (PF) 19400 UNT/0.65ML injection Commonly known as:  ZOSTAVAX Inject 19,400 Units into the skin once.        Review of Systems:  Review of Systems  Physical Exam: Vitals:   05/16/16 1444  BP: 122/80  Pulse: 89  Temp:  98.1 F (36.7 C)  TempSrc: Oral  SpO2: 94%  Weight: 136 lb (61.7 kg)  Height: 5' 0.24" (1.53 m)   Body mass index is 26.35 kg/m. Physical Exam  Constitutional: She is oriented to person, place, and time. She appears well-developed and well-nourished. No distress.  HENT:  Head: Normocephalic and atraumatic.  Right Ear: Hearing, tympanic membrane, external ear and ear canal normal.  Left Ear: Hearing, tympanic membrane, external ear and ear canal normal.  Mouth/Throat: Uvula is midline, oropharynx is clear and moist and mucous membranes are normal. She does not have dentures.  Eyes: Conjunctivae, EOM and lids are normal. Pupils are equal, round, and reactive to light. No scleral icterus.  Neck: Trachea normal and normal range of motion. Neck supple. Carotid bruit is not present. No thyroid mass and no thyromegaly present.  Cardiovascular: Normal rate, regular rhythm and intact distal pulses.  Exam reveals no gallop and no friction rub.   Murmur (1/6 SEM) heard. No carotid bruit b/l. No LE edema b/l. No calf TTP.   Pulmonary/Chest: Effort normal and breath sounds normal. She has no wheezes. She has no rhonchi. She has no rales. Right breast exhibits mass (NT pea sized nodule, freely mobile). Right breast exhibits no inverted nipple, no nipple discharge, no skin change and no tenderness. Left breast exhibits no inverted nipple, no mass, no nipple discharge, no skin change and no tenderness. Breasts are symmetrical.  Abdominal: Soft. Normal appearance, normal aorta and bowel sounds are normal. She exhibits no pulsatile midline mass and no mass. There is no hepatosplenomegaly. There is no tenderness. There is no rigidity, no rebound and no guarding. No hernia.  Musculoskeletal: Normal range of motion. She exhibits edema.  Lymphadenopathy:       Head (  right side): No posterior auricular adenopathy present.       Head (left side): No posterior auricular adenopathy present.    She has no cervical  adenopathy.       Right: No supraclavicular adenopathy present.       Left: No supraclavicular adenopathy present.  Neurological: She is alert and oriented to person, place, and time. She has normal strength and normal reflexes. No cranial nerve deficit. Gait normal.  Skin: Skin is warm, dry and intact. No rash noted. Nails show no clubbing.  Psychiatric: She has a normal mood and affect. Her speech is normal and behavior is normal. Thought content normal. Cognition and memory are normal.    Labs reviewed:  Basic Metabolic Panel:  Recent Labs  07/26/15 0808 05/08/16 0824  NA 135* 144  K 4.0 4.2  CL 93* 106  CO2 25 29  GLUCOSE 105* 125*  BUN 21 17  CREATININE 1.19* 1.20*  CALCIUM 9.3 9.3  TSH  --  1.42   Liver Function Tests:  Recent Labs  07/26/15 0808 05/08/16 0824  AST 25 20  ALT 18 15  ALKPHOS 73 73  BILITOT 0.3 0.4  PROT 6.7 6.4  ALBUMIN 4.5 3.9   No results for input(s): LIPASE, AMYLASE in the last 8760 hours. No results for input(s): AMMONIA in the last 8760 hours. CBC:  Recent Labs  05/08/16 0824  WBC 4.8  NEUTROABS 3,216  HGB 13.3  HCT 39.9  MCV 88.3  PLT 266   Lipid Panel:  Recent Labs  07/26/15 0808 05/08/16 0824  CHOL 138 143  HDL 64 59  LDLCALC 60 58  TRIG 68 128  CHOLHDL 2.2 2.4   Lab Results  Component Value Date   HGBA1C 6.4 (H) 05/08/2016    Procedures: No results found. ECG OBTAINED AND REVIEWED BY MYSELF:  NSR @ 90 bpm, nml axis, LAE, poor R wave progression. No acute ischemic changes. No change since June 2015  Assessment/Plan   ICD-9-CM ICD-10-CM   1. Well adult exam V70.0 Z00.00   2. Essential hypertension 401.9 I10 EKG 12-Lead  3. Type 2 diabetes mellitus with diabetic nephropathy, without long-term current use of insulin (HCC) 250.40 E11.21    583.81    4. History of CVA with residual deficit 438.9 I69.30   5. Hyperlipidemia 272.4 E78.5   6. CKD (chronic kidney disease), stage 3 (moderate) 585.3 N18.3   7.  Macular degeneration of both eyes 362.50 H35.30   8.      Scalp cyst - benign. Reassurance provided 9.      Lump of right breast, small and hard, mobile  Pt is UTD on health maintenance. Vaccinations are UTD. Pt maintains a healthy lifestyle. Encouraged pt to exercise 30-45 minutes 4-5 times per week. Eat a well balanced diet. Avoid smoking. Limit alcohol intake. Wear seatbelt when riding in the car. Wear sun block (SPF >50) when spending extended times outside.  Declined mammogram  She did not pas the clock drawing test due to poor vision from macular degeneration  Continue current medications as ordered  Follow up with specialists as scheduled  Follow up in 3 mos for routine visit. Fasting labs prior to visit (bmp, alt, a1c, lipid panel)   Alaiah Lundy S. Perlie Gold  [facility] and Adult Medicine [address] [place], [place] [address] [phone] Cell (Monday-Friday 8 AM - 5 PM) [phone] After 5 PM and follow prompts

## 2016-05-17 ENCOUNTER — Other Ambulatory Visit: Payer: Self-pay

## 2016-05-17 MED ORDER — GLIPIZIDE ER 5 MG PO TB24: ORAL_TABLET | ORAL | 1 refills | Status: DC

## 2016-05-17 MED ORDER — SIMVASTATIN 10 MG PO TABS: ORAL_TABLET | ORAL | 1 refills | Status: DC

## 2016-06-01 DIAGNOSIS — H353212 Exudative age-related macular degeneration, right eye, with inactive choroidal neovascularization: Secondary | ICD-10-CM | POA: Diagnosis not present

## 2016-07-02 DIAGNOSIS — Z961 Presence of intraocular lens: Secondary | ICD-10-CM | POA: Diagnosis not present

## 2016-07-20 DIAGNOSIS — H353212 Exudative age-related macular degeneration, right eye, with inactive choroidal neovascularization: Secondary | ICD-10-CM | POA: Diagnosis not present

## 2016-08-01 ENCOUNTER — Other Ambulatory Visit: Payer: Self-pay | Admitting: Internal Medicine

## 2016-08-06 ENCOUNTER — Other Ambulatory Visit: Payer: Medicare Other

## 2016-08-06 DIAGNOSIS — E1121 Type 2 diabetes mellitus with diabetic nephropathy: Secondary | ICD-10-CM

## 2016-08-06 DIAGNOSIS — I10 Essential (primary) hypertension: Secondary | ICD-10-CM

## 2016-08-06 DIAGNOSIS — E785 Hyperlipidemia, unspecified: Secondary | ICD-10-CM

## 2016-08-06 LAB — LIPID PANEL
Cholesterol: 143 mg/dL (ref 125–200)
HDL: 60 mg/dL (ref >=46)
LDL CALC: 62 mg/dL (ref <130)
Total CHOL/HDL Ratio: 2.4 Ratio (ref <=5.0)
Triglycerides: 107 mg/dL (ref <150)
VLDL: 21 mg/dL (ref <30)

## 2016-08-06 LAB — BASIC METABOLIC PANEL WITH GFR
BUN: 21 mg/dL (ref 7–25)
CALCIUM: 9.4 mg/dL (ref 8.6–10.4)
CO2: 28 mmol/L (ref 20–31)
CREATININE: 1.14 mg/dL — AB (ref 0.60–0.93)
Chloride: 105 mmol/L (ref 98–110)
GFR, EST AFRICAN AMERICAN: 53 mL/min — AB (ref >=60)
GFR, Est Non African American: 46 mL/min — ABNORMAL LOW (ref >=60)
Glucose, Bld: 127 mg/dL — ABNORMAL HIGH (ref 65–99)
Potassium: 4.2 mmol/L (ref 3.5–5.3)
SODIUM: 141 mmol/L (ref 135–146)

## 2016-08-06 LAB — ALT: ALT: 14 U/L (ref 6–29)

## 2016-08-07 LAB — HEMOGLOBIN A1C
HEMOGLOBIN A1C: 6.3 % — AB (ref <5.7)
Mean Plasma Glucose: 134 mg/dL

## 2016-08-17 ENCOUNTER — Encounter: Payer: Self-pay | Admitting: Internal Medicine

## 2016-08-17 ENCOUNTER — Ambulatory Visit (INDEPENDENT_AMBULATORY_CARE_PROVIDER_SITE_OTHER): Payer: Medicare Other | Admitting: Internal Medicine

## 2016-08-17 VITALS — BP 128/82 | HR 95 | Temp 98.6°F | Ht 60.0 in | Wt 139.4 lb

## 2016-08-17 DIAGNOSIS — E1121 Type 2 diabetes mellitus with diabetic nephropathy: Secondary | ICD-10-CM | POA: Diagnosis not present

## 2016-08-17 DIAGNOSIS — H3530 Unspecified macular degeneration: Secondary | ICD-10-CM | POA: Diagnosis not present

## 2016-08-17 DIAGNOSIS — I6930 Unspecified sequelae of cerebral infarction: Secondary | ICD-10-CM | POA: Diagnosis not present

## 2016-08-17 DIAGNOSIS — I10 Essential (primary) hypertension: Secondary | ICD-10-CM

## 2016-08-17 DIAGNOSIS — E782 Mixed hyperlipidemia: Secondary | ICD-10-CM

## 2016-08-17 MED ORDER — SIMVASTATIN 10 MG PO TABS: ORAL_TABLET | ORAL | 3 refills | Status: DC

## 2016-08-17 NOTE — Patient Instructions (Signed)
Continue current medications as ordered  Follow up with specialists as scheduled  Follow up in 4 mos for routine visit. Fasting labs prior to visit

## 2016-08-17 NOTE — Progress Notes (Signed)
Patient ID: Shirley Sanders, female   DOB: [birth year], 81 y.o.   MRN: [id number]    Location:  [facility] Place of Service: OFFICE  Chief Complaint  Patient presents with  . Medical Management of Chronic Issues    3 months routine visit    HPI:  81 yo female seen today for f/u. She has no concerns. Needs med RF. She had her flu shot this yr and evidently a pneumonia booster shot also. She has had shingles vaccine x 1.   She had RUE torn tendon in last several mos. No known injury. She is followed by Dr Jo[facility] at [facility]. She has had right knee pain and had MRI. She saw ortho and was dx with total of 3 tears (2 were meniscal) and bone fragments. She underwent right knee arthroscopy which has helped. She takes Tylenol ES prn for pain  She continues to work 6 hrs per day M-F.   DM - she does not check BS at home. Last A1c 6.4%. No low BS reactions. No numbness or tingling. She takes glipizide  HTN/hyperlipidemia - she takes chlorthalidone and occasionally has leg cramps. Trying to drink more water during the day. She likes Aquafina. LDL 58  Hx CVA - stable on ASA. No new deficits  Osteoporosis - takes vit D3  CKD - stable; takes Vit D3 and B12  She sees eye specialist for macular degeneration. Takes eye vitamins and gets prn steroid injections. She OU cats with IOL lenses due to worsening eye pressure. Vision not improved since procedure. She continues to OS increased tearing  Past Medical History:  Diagnosis Date  . Bronchitis   . CVA (cerebral infarction)    x 4  . Diabetes mellitus without complication ([place])   . GERD (gastroesophageal reflux disease)   . History of hiatal hernia   . Hyperlipidemia   . Hypertension   . Multiple allergies   . Murmur   . Osteoporosis   . Pneumonia   . Renal disorder   . Shingles   . Shingles   . Stroke [facility])    left sided sensory and motor deficits    Past Surgical History:  Procedure Laterality Date  . ABDOMINAL HYSTERECTOMY    .  CATARACT EXTRACTION Bilateral 02/23/2016, 03/22/2016  . CATARACT EXTRACTION W/PHACO Left 03/01/2016   Procedure: CATARACT EXTRACTION PHACO AND INTRAOCULAR LENS PLACEMENT (IOC);  Surgeon: Eulogio Bear, MD;  Location: [facility] ORS;  Service: Ophthalmology;  Laterality: Left;  [place] 1.09 AP% 14.7 CDE 10.13 FLUID BAG LOT # [id number]  . CATARACT EXTRACTION W/PHACO Right 03/22/2016   Procedure: CATARACT EXTRACTION PHACO AND INTRAOCULAR LENS PLACEMENT ([place]);  Surgeon: Eulogio Bear, MD;  Location: [facility] ORS;  Service: Ophthalmology;  Laterality: Right;  Lot# [id number] H [place]: 01:19.5 AP%: 20.8 CDE: 16.57   . kidney stones    . KNEE SURGERY Right 03/29/2016  . TONSILLECTOMY      Patient Care Team: Gildardo Cranker, DO as PCP - General (Internal Medicine) Milus Height, MD as Referring Physician (Ophthalmology) Rexene Agent, MD as Attending Physician (Nephrology) Gildardo Cranker, DO as Consulting Physician (Internal Medicine)  Social History   Social History  . Marital status: Divorced    Spouse name: N/A  . Number of children: 2  . 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 Smoking status: Never Smoker  . Smokeless tobacco: Never Used  . Alcohol use No  .  Drug use: No  . Sexual activity: No   Other Topics Concern  . Not on file   Social History Narrative   Patient lives at home alone, one stories, no pets   Patient right handed   Patient drinks coffee and diet coke   Past profession- Office     reports that she has never smoked. She has never used smokeless tobacco. She reports that she does not drink alcohol or use drugs.  Family History  Problem Relation Age of Onset  . Stroke Mother   . Hypertension Father   . Kidney disease Father   . Diabetes Father   . Cancer Father   . Diabetes Sister   . Cancer Sister     lung  . Stroke Sister   . Heart disease Brother   . Diabetes Brother   . Heart disease Brother   .  Stroke Brother   . Diabetes Brother   . Stroke Brother    Family Status  Relation Status  . Mother Deceased  . Father Deceased  . Sister Deceased  . Brother Alive  . Brother Deceased  . Daughter Alive  . Son Alive  . Brother Deceased     Allergies  Allergen Reactions  . Codeine Other (See Comments)    hbp  . Contrast Media [Iodinated Diagnostic Agents]   . Fish Allergy   . Milk-Related Compounds     Medications: Patient's Medications  New Prescriptions   No medications on file  Previous Medications   ASPIRIN EC 81 MG TABLET    Take 1 tablet (81 mg total) by mouth daily.   CETIRIZINE (ZYRTEC) 10 MG TABLET    Take 10 mg by mouth daily.   CHLORTHALIDONE PO    Take 12.5 mg by mouth every morning.   GLIPIZIDE (GLUCOTROL XL) 5 MG 24 HR TABLET    TAKE ONE TABLET BY MOUTH ONCE DAILY WITH BREAKFAST FOR DIABETES   LISINOPRIL (PRINIVIL,ZESTRIL) 2.5 MG TABLET    TAKE ONE TABLET BY MOUTH DAILY   MULTIPLE VITAMINS-MINERALS (PRESERVISION AREDS) CAPS    Take 2 capsules by mouth 2 (two) times daily.   POTASSIUM CHLORIDE SA (K-DUR,KLOR-CON) 20 MEQ TABLET    Take 20 mEq by mouth at bedtime.    VITAMIN B-12 (CYANOCOBALAMIN) 1000 MCG TABLET    Take 1,000 mcg by mouth daily.   VITAMIN D, CHOLECALCIFEROL, PO    Take 1 capsule by mouth daily.  Modified Medications   Modified Medication Previous Medication   SIMVASTATIN (ZOCOR) 10 MG TABLET simvastatin (ZOCOR) 10 MG tablet      TAKE ONE TABLET BY MOUTH AT BEDTIME FOR  ELEVATED  CHOLESTEROL.    TAKE ONE TABLET BY MOUTH AT BEDTIME FOR  ELEVATED  CHOLESTEROL.  Discontinued Medications   ZOSTER VACCINE LIVE, PF, (ZOSTAVAX) 83419 UNT/0.65ML INJECTION    Inject 19,400 Units into the skin once.    Review of Systems  Eyes: Positive for visual disturbance.  Musculoskeletal: Positive for arthralgias and gait problem.  Neurological: Negative for weakness.  All other systems reviewed and are negative.   Vitals:   08/17/16 1339  BP: 128/82    Pulse: 95  Temp: 98.6 F (37 C)  TempSrc: Oral  SpO2: 96%  Weight: 139 lb 6.4 oz (63.2 kg)  Height: 5' (1.524 m)   Body mass index is 27.22 kg/m.  Physical Exam  Constitutional: She is oriented to person, place, and time. She appears well-developed and well-nourished.  HENT:  Mouth/Throat: Oropharynx is  clear and moist. No oropharyngeal exudate.  Eyes: Pupils are equal, round, and reactive to light. No scleral icterus.  Neck: Neck supple. Carotid bruit is not present. No tracheal deviation present. No thyromegaly present.  Cardiovascular: Normal rate, regular rhythm, normal heart sounds and intact distal pulses.  Exam reveals no gallop and no friction rub.   No murmur heard. No LE edema b/l. no calf TTP.   Pulmonary/Chest: Effort normal and breath sounds normal. No stridor. No respiratory distress. She has no wheezes. She has no rales.  Abdominal: Soft. Bowel sounds are normal. She exhibits no distension and no mass. There is no hepatomegaly. There is no tenderness. There is no rebound and no guarding.  Lymphadenopathy:    She has no cervical adenopathy.  Neurological: She is alert and oriented to person, place, and time.  Skin: Skin is warm and dry. No rash noted.  Psychiatric: She has a normal mood and affect. Her behavior is normal. Thought content normal.     Labs reviewed: Appointment on 08/06/2016  Component Date Value Ref Range Status  . Sodium 08/06/2016 141  135 - 146 mmol/L Final  . Potassium 08/06/2016 4.2  3.5 - 5.3 mmol/L Final  . Chloride 08/06/2016 105  98 - 110 mmol/L Final  . CO2 08/06/2016 28  20 - 31 mmol/L Final  . Glucose, Bld 08/06/2016 127* 65 - 99 mg/dL Final  . BUN 08/06/2016 21  7 - 25 mg/dL Final  . Creat 08/06/2016 1.14* 0.60 - 0.93 mg/dL Final   Comment:   For patients > or = 79 years of age: The upper reference limit for Creatinine is approximately 13% higher for people identified as African-American.     . Calcium 08/06/2016 9.4  8.6 -  10.4 mg/dL Final  . GFR, Est African American 08/06/2016 53* >=60 mL/min Final  . GFR, Est Non African American 08/06/2016 46* >=60 mL/min Final  . ALT 08/06/2016 14  6 - 29 U/L Final  . Hgb A1c MFr Bld 08/07/2016 6.3* <5.7 % Final   Comment:   For someone without known diabetes, a hemoglobin A1c value between 5.7% and 6.4% is consistent with prediabetes and should be confirmed with a follow-up test.   For someone with known diabetes, a value <7% indicates that their diabetes is well controlled. A1c targets should be individualized based on duration of diabetes, age, co-morbid conditions and other considerations.   This assay result is consistent with an increased risk of diabetes.   Currently, no consensus exists regarding use of hemoglobin A1c for diagnosis of diabetes in children.     . Mean Plasma Glucose 08/07/2016 134  mg/dL Final  . Cholesterol 08/06/2016 143  125 - 200 mg/dL Final  . Triglycerides 08/06/2016 107  <150 mg/dL Final  . HDL 08/06/2016 60  >=46 mg/dL Final  . Total CHOL/HDL Ratio 08/06/2016 2.4  <=5.0 Ratio Final  . VLDL 08/06/2016 21  <30 mg/dL Final  . LDL Cholesterol 08/06/2016 62  <130 mg/dL Final   Comment:   Total Cholesterol/HDL Ratio:CHD Risk                        Coronary Heart Disease Risk Table                                        Men       Women  1/2 Average Risk              3.4        3.3              Average Risk              5.0        4.4           2X Average Risk              9.6        7.1           3X Average Risk             23.4       11.0 Use the calculated Patient Ratio above and the CHD Risk table  to determine the patient's CHD Risk.     No results found.   Assessment/Plan   ICD-9-CM ICD-10-CM   1. Type 2 diabetes mellitus with diabetic nephropathy, without long-term current use of insulin (HCC) 250.40 E11.21 BMP with eGFR   583.81  ALT     Hemoglobin A1c  2. Essential hypertension 401.9 I10   3. History of CVA  with residual deficit 438.9 I69.30   4. Mixed hyperlipidemia 272.2 E78.2 Lipid Panel  5. Macular degeneration of both eyes 362.50 H35.30     Continue current medications as ordered  Follow up with specialists as scheduled  Follow up in 4 mos for routine visit. Fasting labs prior to visit  [place] S. Perlie Gold  [facility] and Adult Medicine [address] [place], [place] [address] [phone] Cell (Monday-Friday 8 AM - 5 PM) [phone] After 5 PM and follow prompts

## 2016-09-07 DIAGNOSIS — H353212 Exudative age-related macular degeneration, right eye, with inactive choroidal neovascularization: Secondary | ICD-10-CM | POA: Diagnosis not present

## 2016-09-13 DIAGNOSIS — N184 Chronic kidney disease, stage 4 (severe): Secondary | ICD-10-CM | POA: Diagnosis not present

## 2016-09-14 ENCOUNTER — Telehealth: Payer: Self-pay | Admitting: Internal Medicine

## 2016-09-14 NOTE — Telephone Encounter (Signed)
left msg asking pt's daughter to confirm this AWV appt w/ nurse and CPE w/ Dr. Eulas Post. VDM (DD)

## 2016-10-18 DIAGNOSIS — N183 Chronic kidney disease, stage 3 (moderate): Secondary | ICD-10-CM | POA: Diagnosis not present

## 2016-10-18 DIAGNOSIS — I10 Essential (primary) hypertension: Secondary | ICD-10-CM | POA: Diagnosis not present

## 2016-10-26 ENCOUNTER — Telehealth: Payer: Self-pay | Admitting: Internal Medicine

## 2016-10-26 NOTE — Telephone Encounter (Signed)
left another msg asking pt to confirm this AWV appt w/ nurse and CPE w/ Dr. Eulas Post. VDM (DD)

## 2016-11-02 DIAGNOSIS — H353212 Exudative age-related macular degeneration, right eye, with inactive choroidal neovascularization: Secondary | ICD-10-CM | POA: Diagnosis not present

## 2016-11-04 ENCOUNTER — Encounter: Payer: Self-pay | Admitting: Internal Medicine

## 2016-11-06 ENCOUNTER — Encounter: Payer: Self-pay | Admitting: Internal Medicine

## 2016-11-10 ENCOUNTER — Other Ambulatory Visit: Payer: Self-pay | Admitting: Internal Medicine

## 2016-11-16 ENCOUNTER — Encounter: Payer: Self-pay | Admitting: Internal Medicine

## 2016-11-22 ENCOUNTER — Encounter: Payer: Self-pay | Admitting: Internal Medicine

## 2016-12-14 DIAGNOSIS — H353211 Exudative age-related macular degeneration, right eye, with active choroidal neovascularization: Secondary | ICD-10-CM | POA: Diagnosis not present

## 2016-12-19 ENCOUNTER — Other Ambulatory Visit: Payer: Medicare Other

## 2016-12-21 ENCOUNTER — Ambulatory Visit: Payer: Medicare Other | Admitting: Internal Medicine

## 2017-01-13 ENCOUNTER — Other Ambulatory Visit: Payer: Self-pay | Admitting: Internal Medicine

## 2017-02-08 DIAGNOSIS — H353212 Exudative age-related macular degeneration, right eye, with inactive choroidal neovascularization: Secondary | ICD-10-CM | POA: Diagnosis not present

## 2017-02-20 ENCOUNTER — Other Ambulatory Visit: Payer: Medicare Other

## 2017-02-22 ENCOUNTER — Ambulatory Visit: Payer: Medicare Other | Admitting: Internal Medicine

## 2017-03-11 ENCOUNTER — Encounter: Payer: Self-pay | Admitting: Internal Medicine

## 2017-03-13 ENCOUNTER — Encounter: Payer: Self-pay | Admitting: Internal Medicine

## 2017-03-18 ENCOUNTER — Other Ambulatory Visit: Payer: Self-pay | Admitting: Internal Medicine

## 2017-03-18 ENCOUNTER — Other Ambulatory Visit: Payer: Medicare Other

## 2017-03-18 DIAGNOSIS — E1121 Type 2 diabetes mellitus with diabetic nephropathy: Secondary | ICD-10-CM | POA: Diagnosis not present

## 2017-03-18 DIAGNOSIS — E782 Mixed hyperlipidemia: Secondary | ICD-10-CM | POA: Diagnosis not present

## 2017-03-18 LAB — BASIC METABOLIC PANEL WITH GFR
BUN: 20 mg/dL (ref 7–25)
CALCIUM: 9.2 mg/dL (ref 8.6–10.4)
CHLORIDE: 106 mmol/L (ref 98–110)
CO2: 27 mmol/L (ref 20–31)
CREATININE: 1.15 mg/dL — AB (ref 0.60–0.93)
GFR, Est African American: 52 mL/min — ABNORMAL LOW (ref >=60)
GFR, Est Non African American: 45 mL/min — ABNORMAL LOW (ref >=60)
GLUCOSE: 132 mg/dL — AB (ref 65–99)
Potassium: 4.5 mmol/L (ref 3.5–5.3)
Sodium: 143 mmol/L (ref 135–146)

## 2017-03-18 LAB — LIPID PANEL
CHOL/HDL RATIO: 2.8 ratio (ref <5.0)
Cholesterol: 153 mg/dL (ref <200)
HDL: 54 mg/dL (ref >50)
LDL CALC: 77 mg/dL (ref <100)
Triglycerides: 108 mg/dL (ref <150)
VLDL: 22 mg/dL (ref <30)

## 2017-03-18 LAB — ALT: ALT: 13 U/L (ref 6–29)

## 2017-03-19 LAB — HEMOGLOBIN A1C
Hgb A1c MFr Bld: 6.6 % — ABNORMAL HIGH (ref <5.7)
MEAN PLASMA GLUCOSE: 143 mg/dL

## 2017-03-20 ENCOUNTER — Ambulatory Visit (INDEPENDENT_AMBULATORY_CARE_PROVIDER_SITE_OTHER): Payer: Medicare Other | Admitting: Internal Medicine

## 2017-03-20 ENCOUNTER — Telehealth: Payer: Self-pay

## 2017-03-20 ENCOUNTER — Encounter: Payer: Self-pay | Admitting: Internal Medicine

## 2017-03-20 VITALS — BP 126/80 | HR 81 | Temp 98.1°F | Ht 60.0 in | Wt 140.0 lb

## 2017-03-20 DIAGNOSIS — E782 Mixed hyperlipidemia: Secondary | ICD-10-CM

## 2017-03-20 DIAGNOSIS — E1121 Type 2 diabetes mellitus with diabetic nephropathy: Secondary | ICD-10-CM

## 2017-03-20 DIAGNOSIS — I6930 Unspecified sequelae of cerebral infarction: Secondary | ICD-10-CM

## 2017-03-20 DIAGNOSIS — K219 Gastro-esophageal reflux disease without esophagitis: Secondary | ICD-10-CM | POA: Diagnosis not present

## 2017-03-20 DIAGNOSIS — I10 Essential (primary) hypertension: Secondary | ICD-10-CM

## 2017-03-20 DIAGNOSIS — E538 Deficiency of other specified B group vitamins: Secondary | ICD-10-CM

## 2017-03-20 DIAGNOSIS — R5382 Chronic fatigue, unspecified: Secondary | ICD-10-CM | POA: Diagnosis not present

## 2017-03-20 NOTE — Telephone Encounter (Signed)
Patient was in office today for a routine follow-up, patient states her current eye doctor is Dr.Bradley Edison Pace. Patient see's eye doctor routinely for macular degeneration injections and state her eye exam is up to date.  I called Dr.Kings office, left message requesting last DM eye exam to be faxed.

## 2017-03-20 NOTE — Patient Instructions (Signed)
Recommend you seek [facility] to determine if further treatment of fatigue available ([facility] in [place] on [address])  Continue current medications as ordered  Take daily naps/rest as indicated  Follow up with specialists as scheduled  Follow up in 5 mos for DM, HTN, fatigue. Fasting labs prior to appt

## 2017-03-20 NOTE — Progress Notes (Signed)
Patient ID: Shirley Sanders, female   DOB: November 13, 1934, 82 y.o.   MRN: [id number]    Location:  [facility] Place of Service: OFFICE  Chief Complaint  Patient presents with  . Medical Management of Chronic Issues    4 month follow-up, discuss labs (copy printed). DM foot exam due.Discuss Chronic Fatigue Syndrome(see article patient bought in for review). Vomiting and stomach discomfort x couple days. Here with Maudie Mercury (daughter)   . Health Maintenance    Refused BMD and TDaP   . Medication Refill    No refills needed     HPI:  82 yo female seen today for f/u. She continues to struggle with fatigue that is severe and occurs approx 1215-1230 each day. It resolves after resting.  She has severe midepigastric pain that radiated into back 2 weeks ago. She had N/V at time. Pain resolved after taking tums x 2 days. She still has gallbladder. She has a hx GERD/HH  Lab results d/w pt  DM - she does not check BS at home. Last A1c 6.4%. No low BS reactions. No numbness or tingling. She takes glipizide  HTN/hyperlipidemia - she takes chlorthalidone and occasionally has leg cramps. Trying to drink more water during the day. She likes Aquafina. LDL 58  Hx CVA - stable on ASA. No new deficits  Osteoporosis - takes vit D3  CKD - stable; takes Vit D3 and B12  She sees eye specialist for macular degeneration. Takes eye vitamins and gets prn steroid injections. She OU cats with IOL lenses due to worsening eye pressure. Vision not improved since procedure. She continues to OS increased tearing  Past Medical History:  Diagnosis Date  . Bronchitis   . CVA (cerebral infarction)    x 4  . Diabetes mellitus without complication ([place])   . GERD (gastroesophageal reflux disease)   . History of hiatal hernia   . Hyperlipidemia   . Hypertension   . Multiple allergies   . Murmur   . Osteoporosis   . Pneumonia   . Renal disorder   . Shingles   . Shingles   . Stroke [facility])    left sided sensory and motor  deficits    Past Surgical History:  Procedure Laterality Date  . ABDOMINAL HYSTERECTOMY    . CATARACT EXTRACTION Bilateral 02/23/2016, 03/22/2016  . CATARACT EXTRACTION W/PHACO Left 03/01/2016   Procedure: CATARACT EXTRACTION PHACO AND INTRAOCULAR LENS PLACEMENT (IOC);  Surgeon: Eulogio Bear, MD;  Location: [facility] ORS;  Service: Ophthalmology;  Laterality: Left;  [place] 1.09 AP% 14.7 CDE 10.13 FLUID BAG LOT # [id number]  . CATARACT EXTRACTION W/PHACO Right 03/22/2016   Procedure: CATARACT EXTRACTION PHACO AND INTRAOCULAR LENS PLACEMENT ([place]);  Surgeon: Eulogio Bear, MD;  Location: [facility] ORS;  Service: Ophthalmology;  Laterality: Right;  Lot# [id number] H [place]: 01:19.5 AP%: 20.8 CDE: 16.57   . kidney stones    . KNEE SURGERY Right 03/29/2016  . TONSILLECTOMY      Patient Care Team: Gildardo Cranker, DO as PCP - General (Internal Medicine) Milus Height, MD as Referring Physician (Ophthalmology) Rexene Agent, MD as Attending Physician (Nephrology) Gildardo Cranker, DO as Consulting Physician (Internal Medicine) Eulogio Bear, MD as Consulting Physician (Ophthalmology)  Social History   Social History  . Marital status: Divorced    Spouse name: N/A  . Number of children: 2  . 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 Smoking  status: Never Smoker  . Smokeless tobacco: Never Used  . Alcohol use No  . Drug use: No  . Sexual activity: No   Other Topics Concern  . Not on file   Social History Narrative   Patient lives at home alone, one stories, no pets   Patient right handed   Patient drinks coffee and diet coke   Past profession- Office     reports that she has never smoked. She has never used smokeless tobacco. She reports that she does not drink alcohol or use drugs.  Family History  Problem Relation Age of Onset  . Stroke Mother   . Hypertension Father   . Kidney disease Father   . Diabetes Father     . Cancer Father   . Diabetes Sister   . Cancer Sister        lung  . Stroke Sister   . Heart disease Brother   . Diabetes Brother   . Heart disease Brother   . Stroke Brother   . Diabetes Brother   . Stroke Brother    Family Status  Relation Status  . Mother Deceased  . Father Deceased  . Sister Deceased  . Brother Alive  . Brother Deceased  . Daughter Alive  . Son Alive  . Brother Deceased     Allergies  Allergen Reactions  . Codeine Other (See Comments)    hbp  . Contrast Media [Iodinated Diagnostic Agents]   . Fish Allergy   . Milk-Related Compounds     Medications: Patient's Medications  New Prescriptions   No medications on file  Previous Medications   ASPIRIN EC 81 MG TABLET    Take 1 tablet (81 mg total) by mouth daily.   CETIRIZINE (ZYRTEC) 10 MG TABLET    Take 10 mg by mouth daily.   CHLORTHALIDONE PO    Take 12.5 mg by mouth every morning.   GLIPIZIDE XL 5 MG 24 HR TABLET    TAKE ONE TABLET BY MOUTH ONCE DAILY WITH BREAKFAST FOR DIABETES   LISINOPRIL (PRINIVIL,ZESTRIL) 2.5 MG TABLET    TAKE ONE TABLET BY MOUTH ONCE DAILY   MULTIPLE VITAMINS-MINERALS (PRESERVISION AREDS) CAPS    Take 2 capsules by mouth 2 (two) times daily.   POTASSIUM CHLORIDE SA (K-DUR,KLOR-CON) 20 MEQ TABLET    Take 20 mEq by mouth at bedtime.    SIMVASTATIN (ZOCOR) 10 MG TABLET    TAKE ONE TABLET BY MOUTH AT BEDTIME FOR  ELEVATED  CHOLESTEROL.   VITAMIN B-12 (CYANOCOBALAMIN) 1000 MCG TABLET    Take 1,000 mcg by mouth daily.   VITAMIN D, CHOLECALCIFEROL, PO    Take 1 capsule by mouth daily.  Modified Medications   No medications on file  Discontinued Medications   No medications on file    Review of Systems  Constitutional: Positive for fatigue.  Gastrointestinal: Positive for abdominal pain and vomiting.  All other systems reviewed and are negative.   Vitals:   03/20/17 1534  BP: 126/80  Pulse: 81  Temp: 98.1 F (36.7 C)  TempSrc: Oral  SpO2: 98%  Weight: 140 lb  (63.5 kg)  Height: 5' (1.524 m)   Body mass index is 27.34 kg/m.  Physical Exam  Constitutional: She is oriented to person, place, and time. She appears well-developed and well-nourished.  HENT:  Mouth/Throat: Oropharynx is clear and moist. No oropharyngeal exudate.  Eyes: Pupils are equal, round, and reactive to light. No scleral icterus.  Neck: Neck supple. Carotid bruit  is not present. No tracheal deviation present. No thyromegaly present.  Cardiovascular: Normal rate, regular rhythm, normal heart sounds and intact distal pulses.  Exam reveals no gallop and no friction rub.   No murmur heard. No LE edema b/l. no calf TTP.   Pulmonary/Chest: Effort normal and breath sounds normal. No stridor. No respiratory distress. She has no wheezes. She has no rales.  Abdominal: Soft. Bowel sounds are normal. She exhibits distension. She exhibits no mass. There is no hepatomegaly. There is no tenderness. There is no rebound and no guarding.  Lymphadenopathy:    She has no cervical adenopathy.  Neurological: She is alert and oriented to person, place, and time.  Skin: Skin is warm and dry. No rash noted.  Psychiatric: She has a normal mood and affect. Her behavior is normal. Thought content normal.     Labs reviewed: Appointment on 03/18/2017  Component Date Value Ref Range Status  . Sodium 03/18/2017 143  135 - 146 mmol/L Final  . Potassium 03/18/2017 4.5  3.5 - 5.3 mmol/L Final  . Chloride 03/18/2017 106  98 - 110 mmol/L Final  . CO2 03/18/2017 27  20 - 31 mmol/L Final  . Glucose, Bld 03/18/2017 132* 65 - 99 mg/dL Final  . BUN 03/18/2017 20  7 - 25 mg/dL Final  . Creat 03/18/2017 1.15* 0.60 - 0.93 mg/dL Final   Comment:   For patients > or = 80 years of age: The upper reference limit for Creatinine is approximately 13% higher for people identified as African-American.     . Calcium 03/18/2017 9.2  8.6 - 10.4 mg/dL Final  . GFR, Est African American 03/18/2017 52* >=60 mL/min Final  .  GFR, Est Non African American 03/18/2017 45* >=60 mL/min Final  . ALT 03/18/2017 13  6 - 29 U/L Final  . Cholesterol 03/18/2017 153  <200 mg/dL Final  . Triglycerides 03/18/2017 108  <150 mg/dL Final  . HDL 03/18/2017 54  >50 mg/dL Final  . Total CHOL/HDL Ratio 03/18/2017 2.8  <5.0 Ratio Final  . VLDL 03/18/2017 22  <30 mg/dL Final  . LDL Cholesterol 03/18/2017 77  <100 mg/dL Final  . Hgb A1c MFr Bld 03/18/2017 6.6* <5.7 % Final   Comment:   For someone without known diabetes, a hemoglobin A1c value of 6.5% or greater indicates that they may have diabetes and this should be confirmed with a follow-up test.   For someone with known diabetes, a value <7% indicates that their diabetes is well controlled and a value greater than or equal to 7% indicates suboptimal control. A1c targets should be individualized based on duration of diabetes, age, comorbid conditions, and other considerations.   Currently, no consensus exists for use of hemoglobin A1c for diagnosis of diabetes for children.     . Mean Plasma Glucose 03/18/2017 143  mg/dL Final    No results found.   Assessment/Plan   ICD-10-CM   1. Chronic fatigue - failing to change as expected R53.82   2. History of CVA with residual deficit I69.30   3. Type 2 diabetes mellitus with diabetic nephropathy, without long-term current use of insulin (HCC) E11.21 CMP with eGFR    Lipid Panel    Hemoglobin A1c    Microalbumin/Creatinine Ratio, Urine  4. Essential hypertension I10   5. Mixed hyperlipidemia E78.2 Lipid Panel  6. B12 deficiency E53.8   7. Gastroesophageal reflux disease without esophagitis K21.9    Recommend you seek [facility] Specialist to determine if further  treatment of fatigue available ([facility] in [place] on [address])  Continue current medications as ordered  Take daily naps/rest as indicated  Follow up with specialists as scheduled  Follow up in  5 mos for DM, HTN, fatigue. Fasting labs prior to appt  Keep AWV appt in August 2018    [place]. Perlie Gold  [facility] [address] [place], [place] [address] [phone] Cell (Monday-Friday 8 AM - 5 PM) [phone] After 5 PM and follow prompts

## 2017-03-22 ENCOUNTER — Encounter: Payer: Self-pay | Admitting: *Deleted

## 2017-04-01 DIAGNOSIS — H353212 Exudative age-related macular degeneration, right eye, with inactive choroidal neovascularization: Secondary | ICD-10-CM | POA: Diagnosis not present

## 2017-05-24 ENCOUNTER — Encounter: Payer: Medicare Other | Admitting: Internal Medicine

## 2017-05-24 ENCOUNTER — Ambulatory Visit: Payer: Medicare Other

## 2017-05-24 DIAGNOSIS — H353212 Exudative age-related macular degeneration, right eye, with inactive choroidal neovascularization: Secondary | ICD-10-CM | POA: Diagnosis not present

## 2017-05-31 ENCOUNTER — Ambulatory Visit: Payer: Medicare Other

## 2017-06-03 ENCOUNTER — Ambulatory Visit: Payer: Medicare Other

## 2017-06-04 ENCOUNTER — Encounter: Payer: Medicare Other | Admitting: Internal Medicine

## 2017-06-11 ENCOUNTER — Encounter: Payer: Medicare Other | Admitting: Internal Medicine

## 2017-07-08 DIAGNOSIS — H353212 Exudative age-related macular degeneration, right eye, with inactive choroidal neovascularization: Secondary | ICD-10-CM | POA: Diagnosis not present

## 2017-07-08 LAB — HM DIABETES EYE EXAM

## 2017-07-25 ENCOUNTER — Telehealth: Payer: Self-pay | Admitting: Internal Medicine

## 2017-07-25 NOTE — Telephone Encounter (Signed)
Left another message asking if pt can come on 08/16/17 for AWV-I with nurse.  Explained that we can also do labs this day if needed. VDM (DD)

## 2017-08-13 ENCOUNTER — Other Ambulatory Visit: Payer: Medicare Other

## 2017-08-16 ENCOUNTER — Other Ambulatory Visit: Payer: Self-pay

## 2017-08-16 ENCOUNTER — Ambulatory Visit (INDEPENDENT_AMBULATORY_CARE_PROVIDER_SITE_OTHER): Payer: Medicare Other

## 2017-08-16 VITALS — BP 138/82 | HR 84 | Temp 98.4°F | Ht 60.0 in | Wt 133.0 lb

## 2017-08-16 DIAGNOSIS — E1121 Type 2 diabetes mellitus with diabetic nephropathy: Secondary | ICD-10-CM | POA: Diagnosis not present

## 2017-08-16 DIAGNOSIS — Z0000 Encounter for general adult medical examination without abnormal findings: Secondary | ICD-10-CM

## 2017-08-16 DIAGNOSIS — E782 Mixed hyperlipidemia: Secondary | ICD-10-CM | POA: Diagnosis not present

## 2017-08-16 LAB — COMPLETE METABOLIC PANEL WITH GFR
AG Ratio: 1.6 (calc) (ref 1.0–2.5)
ALBUMIN MSPROF: 4.1 g/dL (ref 3.6–5.1)
ALKALINE PHOSPHATASE (APISO): 70 U/L (ref 33–130)
ALT: 10 U/L (ref 6–29)
AST: 16 U/L (ref 10–35)
BILIRUBIN TOTAL: 0.5 mg/dL (ref 0.2–1.2)
BUN / CREAT RATIO: 15 (calc) (ref 6–22)
BUN: 18 mg/dL (ref 7–25)
CO2: 30 mmol/L (ref 20–32)
CREATININE: 1.24 mg/dL — AB (ref 0.60–0.88)
Calcium: 9.3 mg/dL (ref 8.6–10.4)
Chloride: 104 mmol/L (ref 98–110)
GFR, EST AFRICAN AMERICAN: 48 mL/min/{1.73_m2} — AB (ref >=60)
GFR, Est Non African American: 41 mL/min/{1.73_m2} — ABNORMAL LOW (ref >=60)
GLOBULIN: 2.6 g/dL (ref 1.9–3.7)
Glucose, Bld: 128 mg/dL — ABNORMAL HIGH (ref 65–99)
Potassium: 4 mmol/L (ref 3.5–5.3)
SODIUM: 141 mmol/L (ref 135–146)
Total Protein: 6.7 g/dL (ref 6.1–8.1)

## 2017-08-16 LAB — LIPID PANEL
CHOLESTEROL: 163 mg/dL (ref <200)
HDL: 58 mg/dL (ref >50)
LDL CHOLESTEROL (CALC): 85 mg/dL
Non-HDL Cholesterol (Calc): 105 mg/dL (calc) (ref <130)
TRIGLYCERIDES: 108 mg/dL (ref <150)
Total CHOL/HDL Ratio: 2.8 (calc) (ref <5.0)

## 2017-08-16 MED ORDER — TETANUS-DIPHTH-ACELL PERTUSSIS 5-2.5-18.5 LF-MCG/0.5 IM SUSP: 0.5000 mL | Freq: Once | INTRAMUSCULAR | 0 refills | Status: AC

## 2017-08-16 NOTE — Patient Instructions (Signed)
Shirley Sanders , Thank you for taking time to come for your Medicare Wellness Visit. I appreciate your ongoing commitment to your health goals. Please review the following plan we discussed and let me know if I can assist you in the future.   Screening recommendations/referrals: Colonoscopy excluded, you are over age 80 Mammogram excluded, you are over age 49 Bone Density up to date Recommended yearly ophthalmology/optometry visit for glaucoma screening and checkup Recommended yearly dental visit for hygiene and checkup  Vaccinations: Influenza vaccine up to date. Due 2019 fall season Pneumococcal vaccine up to date. Tdap vaccine due, prescription printed out Shingles vaccine up to date. Declined new vaccine.   Advanced directives: Please bring [place] a copy of living will and health care power of attorney  Conditions/risks identified: None  Next appointment: Dr. Eulas Post 08/21/2017 @ 3:30pm   Preventive Care 65 Years and Older, Female Preventive care refers to lifestyle choices and visits with your health care provider that can promote health and wellness. What does preventive care include?  A yearly physical exam. This is also called an annual well check.  Dental exams once or twice a year.  Routine eye exams. Ask your health care provider how often you should have your eyes checked.  Personal lifestyle choices, including:  Daily care of your teeth and gums.  Regular physical activity.  Eating a healthy diet.  Avoiding tobacco and drug use.  Limiting alcohol use.  Practicing safe sex.  Taking low-dose aspirin every day.  Taking vitamin and mineral supplements as recommended by your health care provider. What happens during an annual well check? The services and screenings done by your health care provider during your annual well check will depend on your age, overall health, lifestyle risk factors, and family history of disease. Counseling  Your health care provider may  ask you questions about your:  Alcohol use.  Tobacco use.  Drug use.  Emotional well-being.  Home and relationship well-being.  Sexual activity.  Eating habits.  History of falls.  Memory and ability to understand (cognition).  Work and work Statistician.  Reproductive health. Screening  You may have the following tests or measurements:  Height, weight, and BMI.  Blood pressure.  Lipid and cholesterol levels. These may be checked every 5 years, or more frequently if you are over 10 years old.  Skin check.  Lung cancer screening. You may have this screening every year starting at age 18 if you have a 30-pack-year history of smoking and currently smoke or have quit within the past 15 years.  Fecal occult blood test (FOBT) of the stool. You may have this test every year starting at age 8.  Flexible sigmoidoscopy or colonoscopy. You may have a sigmoidoscopy every 5 years or a colonoscopy every 10 years starting at age 57.  Hepatitis C blood test.  Hepatitis B blood test.  Sexually transmitted disease (STD) testing.  Diabetes screening. This is done by checking your blood sugar (glucose) after you have not eaten for a while (fasting). You may have this done every 1-3 years.  Bone density scan. This is done to screen for osteoporosis. You may have this done starting at age 34.  Mammogram. This may be done every 1-2 years. Talk to your health care provider about how often you should have regular mammograms. Talk with your health care provider about your test results, treatment options, and if necessary, the need for more tests. Vaccines  Your health care provider may recommend certain vaccines, such  as:  Influenza vaccine. This is recommended every year.  Tetanus, diphtheria, and acellular pertussis (Tdap, Td) vaccine. You may need a Td booster every 10 years.  Zoster vaccine. You may need this after age 14.  Pneumococcal 13-valent conjugate (PCV13) vaccine. One  dose is recommended after age 49.  Pneumococcal polysaccharide (PPSV23) vaccine. One dose is recommended after age 49. Talk to your health care provider about which screenings and vaccines you need and how often you need them. This information is not intended to replace advice given to you by your health care provider. Make sure you discuss any questions you have with your health care provider. Document Released: 10/21/2015 Document Revised: 06/13/2016 Document Reviewed: 07/26/2015 Elsevier Interactive Patient Education  2017 [place] Prevention in the Home Falls can cause injuries. They can happen to people of all ages. There are many things you can do to make your home safe and to help prevent falls. What can I do on the outside of my home?  Regularly fix the edges of walkways and driveways and fix any cracks.  Remove anything that might make you trip as you walk through a door, such as a raised step or threshold.  Trim any bushes or trees on the path to your home.  Use bright outdoor lighting.  Clear any walking paths of anything that might make someone trip, such as rocks or tools.  Regularly check to see if handrails are loose or broken. Make sure that both sides of any steps have handrails.  Any raised decks and porches should have guardrails on the edges.  Have any leaves, snow, or ice cleared regularly.  Use sand or salt on walking paths during winter.  Clean up any spills in your garage right away. This includes oil or grease spills. What can I do in the bathroom?  Use night lights.  Install grab bars by the toilet and in the tub and shower. Do not use towel bars as grab bars.  Use non-skid mats or decals in the tub or shower.  If you need to sit down in the shower, use a plastic, non-slip stool.  Keep the floor dry. Clean up any water that spills on the floor as soon as it happens.  Remove soap buildup in the tub or shower regularly.  Attach bath  mats securely with double-sided non-slip rug tape.  Do not have throw rugs and other things on the floor that can make you trip. What can I do in the bedroom?  Use night lights.  Make sure that you have a light by your bed that is easy to reach.  Do not use any sheets or blankets that are too big for your bed. They should not hang down onto the floor.  Have a firm chair that has side arms. You can use this for support while you get dressed.  Do not have throw rugs and other things on the floor that can make you trip. What can I do in the kitchen?  Clean up any spills right away.  Avoid walking on wet floors.  Keep items that you use a lot in easy-to-reach places.  If you need to reach something above you, use a strong step stool that has a grab bar.  Keep electrical cords out of the way.  Do not use floor polish or wax that makes floors slippery. If you must use wax, use non-skid floor wax.  Do not have throw rugs and other things on the  floor that can make you trip. What can I do with my stairs?  Do not leave any items on the stairs.  Make sure that there are handrails on both sides of the stairs and use them. Fix handrails that are broken or loose. Make sure that handrails are as long as the stairways.  Check any carpeting to make sure that it is firmly attached to the stairs. Fix any carpet that is loose or worn.  Avoid having throw rugs at the top or bottom of the stairs. If you do have throw rugs, attach them to the floor with carpet tape.  Make sure that you have a light switch at the top of the stairs and the bottom of the stairs. If you do not have them, ask someone to add them for you. What else can I do to help prevent falls?  Wear shoes that:  Do not have high heels.  Have rubber bottoms.  Are comfortable and fit you well.  Are closed at the toe. Do not wear sandals.  If you use a stepladder:  Make sure that it is fully opened. Do not climb a closed  stepladder.  Make sure that both sides of the stepladder are locked into place.  Ask someone to hold it for you, if possible.  Clearly mark and make sure that you can see:  Any grab bars or handrails.  First and last steps.  Where the edge of each step is.  Use tools that help you move around (mobility aids) if they are needed. These include:  Canes.  Walkers.  Scooters.  Crutches.  Turn on the lights when you go into a dark area. Replace any light bulbs as soon as they burn out.  Set up your furniture so you have a clear path. Avoid moving your furniture around.  If any of your floors are uneven, fix them.  If there are any pets around you, be aware of where they are.  Review your medicines with your doctor. Some medicines can make you feel dizzy. This can increase your chance of falling. Ask your doctor what other things that you can do to help prevent falls. This information is not intended to replace advice given to you by your health care provider. Make sure you discuss any questions you have with your health care provider. Document Released: 07/21/2009 Document Revised: 03/01/2016 Document Reviewed: 10/29/2014 Elsevier Interactive Patient Education  2017 [employer].

## 2017-08-16 NOTE — Progress Notes (Signed)
Subjective:   Shirley Sanders is a 82 y.o. female who presents for an Initial Medicare Annual Wellness Visit.        Objective:    Today's Vitals   08/16/17 0837  BP: 138/82  Pulse: 84  Temp: 98.4 F (36.9 C)  TempSrc: Oral  SpO2: 96%  Weight: 133 lb (60.3 kg)  Height: 5' (1.524 m)   Body mass index is 25.97 kg/m.   Current Medications (verified) Outpatient Encounter Medications as of 08/16/2017  Medication Sig  . aspirin EC 81 MG tablet Take 1 tablet (81 mg total) by mouth daily.  . cetirizine (ZYRTEC) 10 MG tablet Take 10 mg by mouth daily.  . CHLORTHALIDONE PO Take 12.5 mg by mouth every morning.  Marland Kitchen GLIPIZIDE XL 5 MG 24 hr tablet TAKE ONE TABLET BY MOUTH ONCE DAILY WITH BREAKFAST FOR DIABETES  . lisinopril (PRINIVIL,ZESTRIL) 2.5 MG tablet TAKE ONE TABLET BY MOUTH ONCE DAILY  . Multiple Vitamins-Minerals (PRESERVISION AREDS) CAPS Take 2 capsules by mouth 2 (two) times daily.  . potassium chloride SA (K-DUR,KLOR-CON) 20 MEQ tablet Take 20 mEq by mouth at bedtime.   . simvastatin (ZOCOR) 10 MG tablet TAKE ONE TABLET BY MOUTH AT BEDTIME FOR  ELEVATED  CHOLESTEROL.  . [DISCONTINUED] vitamin B-12 (CYANOCOBALAMIN) 1000 MCG tablet Take 1,000 mcg by mouth daily.  . [DISCONTINUED] VITAMIN D, CHOLECALCIFEROL, PO Take 1 capsule by mouth daily.   No facility-administered encounter medications on file as of 08/16/2017.     Allergies (verified) Codeine; Contrast media [iodinated diagnostic agents]; Fish allergy; and Milk-related compounds   History: Past Medical History:  Diagnosis Date  . Bronchitis   . CVA (cerebral infarction)    x 4  . Diabetes mellitus without complication ([place])   . GERD (gastroesophageal reflux disease)   . History of hiatal hernia   . Hyperlipidemia   . Hypertension   . Multiple allergies   . Murmur   . Osteoporosis   . Pneumonia   . Renal disorder   . Shingles   . Shingles   . Stroke [facility])    left sided sensory and motor deficits   Past  Surgical History:  Procedure Laterality Date  . ABDOMINAL HYSTERECTOMY    . CATARACT EXTRACTION Bilateral 02/23/2016, 03/22/2016  . kidney stones    . KNEE SURGERY Right 03/29/2016  . TONSILLECTOMY     Family History  Problem Relation Age of Onset  . Stroke Mother   . Hypertension Father   . Kidney disease Father   . Diabetes Father   . Cancer Father   . Diabetes Sister   . Cancer Sister        lung  . Stroke Sister   . Heart disease Brother   . Diabetes Brother   . Heart disease Brother   . Stroke Brother   . Diabetes Brother   . Stroke Brother    Social History   Occupational History  . Occupation: Advertising copywriter  Tobacco Use  . Smoking status: Never Smoker  . Smokeless tobacco: Never Used  Substance and Sexual Activity  . Alcohol use: No  . Drug use: No  . Sexual activity: No    Tobacco Counseling Counseling given: Not Answered   Activities of Daily Living In your present state of health, do you have any difficulty performing the following activities: 08/16/2017  Hearing? N  Vision? N  Difficulty concentrating or making decisions? N  Walking or climbing stairs? N  Dressing or bathing?  N  Doing errands, shopping? Y  Preparing Food and eating ? N  Using the Toilet? N  In the past six months, have you accidently leaked urine? Y  Comment urinary urgencey  Do you have problems with loss of bowel control? N  Managing your Medications? Y  Managing your Finances? N  Housekeeping or managing your Housekeeping? N  Some recent data might be hidden    Immunizations and Health Maintenance Immunization History  Administered Date(s) Administered  . Influenza, High Dose Seasonal PF 08/03/2017  . Influenza,inj,Quad PF,6+ Mos 07/08/2015  . Influenza-Unspecified 07/08/2014, 06/22/2016  . Pneumococcal Conjugate-13 10/09/2011  . Pneumococcal Polysaccharide-23 06/22/2016  . Varicella 07/08/2015  . Zoster 07/08/2015   Health Maintenance Due  Topic Date Due  . FOOT  EXAM  03/31/1945  . OPHTHALMOLOGY EXAM  03/24/2016    Patient Care Team: Gildardo Cranker, DO as PCP - General (Internal Medicine) Milus Height, MD as Referring Physician (Ophthalmology) Rexene Agent, MD as Attending Physician (Nephrology) Gildardo Cranker, DO as Consulting Physician (Internal Medicine) Eulogio Bear, MD as Consulting Physician (Ophthalmology)  Indicate any recent Medical Services you may have received from other than [facility] providers in the past year (date may be approximate).     Assessment:   This is a routine wellness examination for Shirley Sanders.   Hearing/Vision screen Vision Screening Comments: Sees Dr. Edison Pace every year at [place] issues and exercise activities discussed: Current Exercise Habits: The patient does not participate in regular exercise at present, Exercise limited by: None identified  Goals    None     Depression Screen PHQ 2/9 Scores 08/16/2017 05/16/2016 04/06/2015 11/24/2014 10/22/2014  PHQ - 2 Score 0 0 0 0 0    Fall Risk Fall Risk  08/16/2017 03/20/2017 08/17/2016 05/16/2016 01/04/2016  Falls in the past year? No No No No No  Number falls in past yr: - - - - -  Injury with Fall? - - - - -    Cognitive Function: MMSE - Mini Mental State Exam 08/16/2017 05/16/2016  Orientation to time 5 5  Orientation to Place 5 5  Registration 3 3  Attention/ Calculation 5 5  Recall 2 2  Language- name 2 objects 2 2  Language- repeat 1 1  Language- follow 3 step command 3 3  Language- read & follow direction 1 1  Write a sentence 1 1  Copy design 1 1  Total score 29 29        Screening Tests Health Maintenance  Topic Date Due  . FOOT EXAM  03/31/1945  . OPHTHALMOLOGY EXAM  03/24/2016  . TETANUS/TDAP  10/08/2018 (Originally 03/30/1954)  . HEMOGLOBIN A1C  09/17/2017  . INFLUENZA VACCINE  Completed  . DEXA SCAN  Completed  . PNA vac Low Risk Adult  Completed      Plan:    I have personally reviewed and addressed the Medicare  Annual Wellness questionnaire and have noted the following in the patient's chart:  A. Medical and social history B. Use of alcohol, tobacco or illicit drugs  C. Current medications and supplements D. Functional ability and status E.  Nutritional status F.  Physical activity G. Advance directives H. List of other physicians I.  Hospitalizations, surgeries, and ER visits in previous 12 months J.  [place] to include hearing, vision, cognitive, depression L. Referrals and appointments - none  In addition, I have reviewed and discussed with patient certain preventive protocols, quality metrics, and best practice recommendations.  A written personalized care plan for preventive services as well as general preventive health recommendations were provided to patient.  See attached scanned questionnaire for additional information.   Signed,   Rich Reining, RN Nurse Health Advisor   Quick Notes   Health Maintenance: Foot exam due. Pt will make next eye appointment. TDAP prescription given to them     Abnormal Screen: MMSE 29/30. Did not pass clock drawing.     Patient Concerns: Bump on right arm came up a few weeks ago.     Nurse Concerns: None

## 2017-08-17 LAB — HEMOGLOBIN A1C
EAG (MMOL/L): 7.6 (calc)
HEMOGLOBIN A1C: 6.4 %{Hb} — AB (ref <5.7)
Mean Plasma Glucose: 137 (calc)

## 2017-08-17 LAB — MICROALBUMIN / CREATININE URINE RATIO
Creatinine, Urine: 60 mg/dL (ref 20–275)
MICROALB/CREAT RATIO: 17 ug/mg{creat} (ref <30)
Microalb, Ur: 1 mg/dL

## 2017-08-21 ENCOUNTER — Encounter: Payer: Self-pay | Admitting: Internal Medicine

## 2017-08-21 ENCOUNTER — Ambulatory Visit (INDEPENDENT_AMBULATORY_CARE_PROVIDER_SITE_OTHER): Payer: Medicare Other | Admitting: Internal Medicine

## 2017-08-21 ENCOUNTER — Encounter: Payer: Self-pay | Admitting: *Deleted

## 2017-08-21 VITALS — BP 128/76 | HR 82 | Temp 97.6°F | Ht 60.0 in | Wt 133.0 lb

## 2017-08-21 DIAGNOSIS — E782 Mixed hyperlipidemia: Secondary | ICD-10-CM

## 2017-08-21 DIAGNOSIS — D1721 Benign lipomatous neoplasm of skin and subcutaneous tissue of right arm: Secondary | ICD-10-CM

## 2017-08-21 DIAGNOSIS — I10 Essential (primary) hypertension: Secondary | ICD-10-CM | POA: Diagnosis not present

## 2017-08-21 DIAGNOSIS — R5382 Chronic fatigue, unspecified: Secondary | ICD-10-CM

## 2017-08-21 DIAGNOSIS — E1121 Type 2 diabetes mellitus with diabetic nephropathy: Secondary | ICD-10-CM

## 2017-08-21 NOTE — Patient Instructions (Addendum)
Reassurance given for lipoma  Continue current medications as ordered  Follow up in 5 mos for DM, HTN, hyperlipidemia, CKD. Fasting labs prior to appt

## 2017-08-21 NOTE — Progress Notes (Signed)
Patient ID: Shirley Sanders, female   DOB: 02-10-1935, 82 y.o.   MRN: [id number]    Location:  [facility] Place of Service: OFFICE  Chief Complaint  Patient presents with  . Medical Management of Chronic Issues    5 month follow-up for DM, HTN, fatigue, discuss labs (copy printed) and Foot Exam due. Here with daughter Maudie Mercury   . Medication Refill    No refills needed   . Health Maintenance    Called Dr.King's office to request last eye exam    HPI:  82 yo female seen today for f/u. She is c/a enlarging lump on forearm/wrist x 3-4 mos. No pain and no d/c. No redness.  DM - she does not check BS at home. Last A1c 6.4%. No low BS reactions. No numbness or tingling. She takes glipizide. urine micro/Cr ratio 17  HTN/hyperlipidemia - she takes chlorthalidone and occasionally has leg cramps. Trying to drink more water during the day. She likes Aquafina. LDL 85  Hx CVA - stable on ASA. No new deficits  Osteoporosis - takes vit D3  CKD - stable; takes Vit D3 and B12  Macular degeneration - followed by eye specialist. Takes eye vitamins and gets prn steroid injections. She OU cats with IOL lenses due to worsening eye pressure. Vision not improved since procedure. She continues to have OS tearing  Chronic fatigue - she was unable to f/u with Integrative specialist due to cost. She continues to get really tired everyday at 12:15  Past Medical History:  Diagnosis Date  . Bronchitis   . CVA (cerebral infarction)    x 4  . Diabetes mellitus without complication ([place])   . GERD (gastroesophageal reflux disease)   . History of hiatal hernia   . Hyperlipidemia   . Hypertension   . Multiple allergies   . Murmur   . Osteoporosis   . Pneumonia   . Renal disorder   . Shingles   . Shingles   . Stroke [facility])    left sided sensory and motor deficits    Past Surgical History:  Procedure Laterality Date  . ABDOMINAL HYSTERECTOMY    . CATARACT EXTRACTION Bilateral 02/23/2016, 03/22/2016  . kidney  stones    . KNEE SURGERY Right 03/29/2016  . TONSILLECTOMY      Patient Care Team: Gildardo Cranker, DO as PCP - General (Internal Medicine) Milus Height, MD as Referring Physician (Ophthalmology) Rexene Agent, MD as Attending Physician (Nephrology) Gildardo Cranker, DO as Consulting Physician (Internal Medicine) Eulogio Bear, MD as Consulting Physician (Ophthalmology)  Social History   Socioeconomic History  . Marital status: Divorced    Spouse name: Not on file  . Number of children: 2  . Years of education: 11th  . Highest education level: Not on file  Social Needs  . Financial resource strain: Not on file  . Food insecurity - worry: Not on file  . Food insecurity - inability: Not on file  . Transportation needs - medical: Not on file  . Transportation needs - non-medical: Not on file  Occupational History  . Occupation: Advertising copywriter  Tobacco Use  . Smoking status: Never Smoker  . Smokeless tobacco: Never Used  Substance and Sexual Activity  . Alcohol use: No  . Drug use: No  . Sexual activity: No  Other Topics Concern  . Not on file  Social History Narrative   Patient lives at home alone, one stories, no pets   Patient right handed  Patient drinks coffee and diet coke   Past profession- Office     reports that  has never smoked. she has never used smokeless tobacco. She reports that she does not drink alcohol or use drugs.  Family History  Problem Relation Age of Onset  . Stroke Mother   . Hypertension Father   . Kidney disease Father   . Diabetes Father   . Cancer Father   . Diabetes Sister   . Cancer Sister        lung  . Stroke Sister   . Heart disease Brother   . Diabetes Brother   . Heart disease Brother   . Stroke Brother   . Diabetes Brother   . Stroke Brother    Family Status  Relation Name Status  . Mother Stanton Kidney Deceased  . Father Nadara Mustard Deceased  . Sister Peggy Deceased  . Brother [employer]  . Brother Barnabas Lister Deceased  .  Daughter Janine Ores  . Son [employer]  . Brother Bobby Deceased     Allergies  Allergen Reactions  . Codeine Other (See Comments)    hbp  . Contrast Media [Iodinated Diagnostic Agents]   . Fish Allergy   . Milk-Related Compounds     Medications:   Medication List        Accurate as of 08/21/17  3:37 PM. Always use your most recent med list.          aspirin EC 81 MG tablet Take 1 tablet (81 mg total) by mouth daily.   cetirizine 10 MG tablet Commonly known as:  ZYRTEC   CHLORTHALIDONE PO   GLIPIZIDE XL 5 MG 24 hr tablet Generic drug:  glipiZIDE TAKE ONE TABLET BY MOUTH ONCE DAILY WITH BREAKFAST FOR DIABETES   lisinopril 2.5 MG tablet Commonly known as:  PRINIVIL,ZESTRIL TAKE ONE TABLET BY MOUTH ONCE DAILY   potassium chloride SA 20 MEQ tablet Commonly known as:  K-DUR,KLOR-CON   PRESERVISION AREDS Caps   simvastatin 10 MG tablet Commonly known as:  ZOCOR TAKE ONE TABLET BY MOUTH AT BEDTIME FOR  ELEVATED  CHOLESTEROL.       Review of Systems  Constitutional: Positive for fatigue.  Eyes: Positive for visual disturbance.  Musculoskeletal: Positive for arthralgias.  All other systems reviewed and are negative.   Vitals:   08/21/17 1503  BP: 128/76  Pulse: 82  Temp: 97.6 F (36.4 C)  TempSrc: Oral  SpO2: 93%  Weight: 133 lb (60.3 kg)  Height: 5' (1.524 m)   Body mass index is 25.97 kg/m.  Physical Exam  Constitutional: She is oriented to person, place, and time. She appears well-developed and well-nourished.  HENT:  Mouth/Throat: Oropharynx is clear and moist. No oropharyngeal exudate.  MMM; no oral thrush  Eyes: Pupils are equal, round, and reactive to light. No scleral icterus.  Neck: Neck supple. Carotid bruit is not present. No tracheal deviation present. No thyromegaly present.  Cardiovascular: Normal rate, regular rhythm and intact distal pulses. Exam reveals no gallop and no friction rub.  Murmur (1/6 SEM) heard. No LE edema b/l. no  calf TTP.   Pulmonary/Chest: Effort normal and breath sounds normal. No stridor. No respiratory distress. She has no wheezes. She has no rales.  Abdominal: Soft. Normal appearance and bowel sounds are normal. She exhibits no distension and no mass. There is no hepatomegaly. There is no tenderness. There is no rigidity, no rebound and no guarding. No hernia.  Musculoskeletal: She exhibits edema. She exhibits no  deformity.  Lymphadenopathy:    She has no cervical adenopathy.  Neurological: She is alert and oriented to person, place, and time.  Skin: Skin is warm and dry. No rash noted.  Pea sized freely mobile lipoma right posterior forearm with no redness or TTP  Psychiatric: She has a normal mood and affect. Her behavior is normal. Judgment and thought content normal.   Diabetic Foot Exam - Simple   Simple Foot Form Diabetic Foot exam was performed with the following findings:  Yes 08/21/2017  4:13 PM  Visual Inspection No deformities, no ulcerations, no other skin breakdown bilaterally:  Yes Sensation Testing Intact to touch and monofilament testing bilaterally:  Yes Pulse Check Posterior Tibialis and Dorsalis pulse intact bilaterally:  Yes Comments B/l hammertoes with scaling skin dorsally; no calluses or ulceration       Labs reviewed: Orders Only on 08/16/2017  Component Date Value Ref Range Status  . Creatinine, Urine 08/16/2017 60  20 - 275 mg/dL Final  . Microalb, Ur 08/16/2017 1.0  mg/dL Final   Comment: Reference Range Not established   . Microalb Creat Ratio 08/16/2017 17  <30 mcg/mg creat Final   Comment: . The ADA defines abnormalities in albumin excretion as follows: Marland Kitchen Category         Result (mcg/mg creatinine) . Normal                    <30 Microalbuminuria         30-299  Clinical albuminuria   > OR = 300 . The ADA recommends that at least two of three specimens collected within a 3-6 month period be abnormal before considering a patient to be within  a diagnostic category.   Appointment on 08/16/2017  Component Date Value Ref Range Status  . Cholesterol 08/16/2017 163  <200 mg/dL Final  . HDL 08/16/2017 58  >50 mg/dL Final  . Triglycerides 08/16/2017 108  <150 mg/dL Final  . LDL Cholesterol (Calc) 08/16/2017 85  mg/dL (calc) Final   Comment: Reference range: <100 . Desirable range <100 mg/dL for primary prevention;   <70 mg/dL for patients with CHD or diabetic patients  with > or = 2 CHD risk factors. Marland Kitchen LDL-C is now calculated using the Martin-Hopkins  calculation, which is a validated novel method providing  better accuracy than the Friedewald equation in the  estimation of LDL-C.  Cresenciano Genre et al. Annamaria Helling. 0017;494(49): 2061-2068  (http://education.[facility].com/faq/FAQ164)   . Total CHOL/HDL Ratio 08/16/2017 2.8  <5.0 (calc) Final  . Non-HDL Cholesterol (Calc) 08/16/2017 105  <130 mg/dL (calc) Final   Comment: For patients with diabetes plus 1 major ASCVD risk  factor, treating to a non-HDL-C goal of <100 mg/dL  (LDL-C of <70 mg/dL) is considered a therapeutic  option.   . Glucose, Bld 08/16/2017 128* 65 - 99 mg/dL Final   Comment: .            Fasting reference interval . For someone without known diabetes, a glucose value >125 mg/dL indicates that they may have diabetes and this should be confirmed with a follow-up test. .   . BUN 08/16/2017 18  7 - 25 mg/dL Final  . Creat 08/16/2017 1.24* 0.60 - 0.88 mg/dL Final   Comment: For patients >40 years of age, the reference limit for Creatinine is approximately 13% higher for people identified as African-American. .   . GFR, Est Non African American 08/16/2017 41* > OR = 60 mL/min/1.33m Final  . GFR, Est  African American 08/16/2017 48* > OR = 60 mL/min/1.49m Final  . BUN/Creatinine Ratio 08/16/2017 15  6 - 22 (calc) Final  . Sodium 08/16/2017 141  135 - 146 mmol/L Final  . Potassium 08/16/2017 4.0  3.5 - 5.3 mmol/L Final  . Chloride 08/16/2017 104  98 - 110  mmol/L Final  . CO2 08/16/2017 30  20 - 32 mmol/L Final  . Calcium 08/16/2017 9.3  8.6 - 10.4 mg/dL Final  . Total Protein 08/16/2017 6.7  6.1 - 8.1 g/dL Final  . Albumin 08/16/2017 4.1  3.6 - 5.1 g/dL Final  . Globulin 08/16/2017 2.6  1.9 - 3.7 g/dL (calc) Final  . AG Ratio 08/16/2017 1.6  1.0 - 2.5 (calc) Final  . Total Bilirubin 08/16/2017 0.5  0.2 - 1.2 mg/dL Final  . Alkaline phosphatase (APISO) 08/16/2017 70  33 - 130 U/L Final  . AST 08/16/2017 16  10 - 35 U/L Final  . ALT 08/16/2017 10  6 - 29 U/L Final  . Hgb A1c MFr Bld 08/16/2017 6.4* <5.7 % of total Hgb Final   Comment: For someone without known diabetes, a hemoglobin  A1c value between 5.7% and 6.4% is consistent with prediabetes and should be confirmed with a  follow-up test. . For someone with known diabetes, a value <7% indicates that their diabetes is well controlled. A1c targets should be individualized based on duration of diabetes, age, comorbid conditions, and other considerations. . This assay result is consistent with an increased risk of diabetes. . Currently, no consensus exists regarding use of hemoglobin A1c for diagnosis of diabetes for children. .   . Mean Plasma Glucose 08/16/2017 137  (calc) Final  . eAG (mmol/L) 08/16/2017 7.6  (calc) Final    No results found.   Assessment/Plan   ICD-10-CM   1. Lipoma of right upper extremity D17.21   2. Type 2 diabetes mellitus with diabetic nephropathy, without long-term current use of insulin (HCC) E11.21 BMP with eGFR    ALT    Hemoglobin A1c  3. Essential hypertension I10   4. Mixed hyperlipidemia E78.2 Lipid Panel  5. Chronic fatigue R53.82    Reassurance given for lipoma  Continue current medications as ordered  Follow up in 5 mos for DM, HTN, hyperlipidemia, CKD. Fasting labs prior to appt    MBig CreekS. CPerlie Gold PPhysicians Care Surgical Hospitaland Adult Medicine [address] [phone]  (Monday-Friday 8 AM - 5 PM) ([phone] 5 PM and follow prompts

## 2017-08-26 DIAGNOSIS — H353212 Exudative age-related macular degeneration, right eye, with inactive choroidal neovascularization: Secondary | ICD-10-CM | POA: Diagnosis not present

## 2017-10-06 ENCOUNTER — Other Ambulatory Visit: Payer: Self-pay | Admitting: Internal Medicine

## 2017-10-21 DIAGNOSIS — H353212 Exudative age-related macular degeneration, right eye, with inactive choroidal neovascularization: Secondary | ICD-10-CM | POA: Diagnosis not present

## 2017-11-04 DIAGNOSIS — N184 Chronic kidney disease, stage 4 (severe): Secondary | ICD-10-CM | POA: Diagnosis not present

## 2017-11-13 DIAGNOSIS — I129 Hypertensive chronic kidney disease with stage 1 through stage 4 chronic kidney disease, or unspecified chronic kidney disease: Secondary | ICD-10-CM | POA: Diagnosis not present

## 2017-11-13 DIAGNOSIS — N183 Chronic kidney disease, stage 3 (moderate): Secondary | ICD-10-CM | POA: Diagnosis not present

## 2017-11-23 ENCOUNTER — Other Ambulatory Visit: Payer: Self-pay | Admitting: Internal Medicine

## 2017-12-16 DIAGNOSIS — H353212 Exudative age-related macular degeneration, right eye, with inactive choroidal neovascularization: Secondary | ICD-10-CM | POA: Diagnosis not present

## 2017-12-24 ENCOUNTER — Emergency Department (HOSPITAL_COMMUNITY): Payer: Medicare Other

## 2017-12-24 ENCOUNTER — Other Ambulatory Visit: Payer: Self-pay

## 2017-12-24 ENCOUNTER — Emergency Department (HOSPITAL_COMMUNITY)
Admission: EM | Admit: 2017-12-24 | Discharge: 2017-12-24 | Disposition: A | Discharge status: Home / Self Care | Payer: Medicare Other | Attending: Emergency Medicine | Admitting: Emergency Medicine

## 2017-12-24 ENCOUNTER — Encounter (HOSPITAL_COMMUNITY): Payer: Self-pay | Admitting: *Deleted

## 2017-12-24 DIAGNOSIS — Z79899 Other long term (current) drug therapy: Secondary | ICD-10-CM | POA: Diagnosis not present

## 2017-12-24 DIAGNOSIS — Z8673 Personal history of transient ischemic attack (TIA), and cerebral infarction without residual deficits: Secondary | ICD-10-CM | POA: Insufficient documentation

## 2017-12-24 DIAGNOSIS — I129 Hypertensive chronic kidney disease with stage 1 through stage 4 chronic kidney disease, or unspecified chronic kidney disease: Secondary | ICD-10-CM | POA: Diagnosis not present

## 2017-12-24 DIAGNOSIS — E119 Type 2 diabetes mellitus without complications: Secondary | ICD-10-CM | POA: Insufficient documentation

## 2017-12-24 DIAGNOSIS — I951 Orthostatic hypotension: Secondary | ICD-10-CM | POA: Diagnosis not present

## 2017-12-24 DIAGNOSIS — R42 Dizziness and giddiness: Secondary | ICD-10-CM | POA: Diagnosis not present

## 2017-12-24 DIAGNOSIS — N189 Chronic kidney disease, unspecified: Secondary | ICD-10-CM | POA: Diagnosis not present

## 2017-12-24 LAB — I-STAT CHEM 8, ED
BUN: 21 mg/dL — ABNORMAL HIGH (ref 6–20)
CREATININE: 1.1 mg/dL — AB (ref 0.44–1.00)
Calcium, Ion: 1.19 mmol/L (ref 1.15–1.40)
Chloride: 101 mmol/L (ref 101–111)
Glucose, Bld: 193 mg/dL — ABNORMAL HIGH (ref 65–99)
HEMATOCRIT: 44 % (ref 36.0–46.0)
HEMOGLOBIN: 15 g/dL (ref 12.0–15.0)
POTASSIUM: 3.5 mmol/L (ref 3.5–5.1)
Sodium: 141 mmol/L (ref 135–145)
TCO2: 27 mmol/L (ref 22–32)

## 2017-12-24 LAB — URINALYSIS, ROUTINE W REFLEX MICROSCOPIC
Bilirubin Urine: NEGATIVE
GLUCOSE, UA: 50 mg/dL — AB
Hgb urine dipstick: NEGATIVE
Ketones, ur: NEGATIVE mg/dL
LEUKOCYTES UA: NEGATIVE
Nitrite: NEGATIVE
PROTEIN: NEGATIVE mg/dL
Specific Gravity, Urine: 1.013 (ref 1.005–1.030)
pH: 6 (ref 5.0–8.0)

## 2017-12-24 LAB — COMPREHENSIVE METABOLIC PANEL
ALBUMIN: 4.1 g/dL (ref 3.5–5.0)
ALT: 13 U/L — ABNORMAL LOW (ref 14–54)
AST: 22 U/L (ref 15–41)
Alkaline Phosphatase: 86 U/L (ref 38–126)
Anion gap: 11 (ref 5–15)
BUN: 19 mg/dL (ref 6–20)
CHLORIDE: 103 mmol/L (ref 101–111)
CO2: 25 mmol/L (ref 22–32)
Calcium: 9.4 mg/dL (ref 8.9–10.3)
Creatinine, Ser: 1.21 mg/dL — ABNORMAL HIGH (ref 0.44–1.00)
GFR calc Af Amer: 48 mL/min — ABNORMAL LOW (ref >60)
GFR, EST NON AFRICAN AMERICAN: 41 mL/min — AB (ref >60)
GLUCOSE: 198 mg/dL — AB (ref 65–99)
POTASSIUM: 3.5 mmol/L (ref 3.5–5.1)
SODIUM: 139 mmol/L (ref 135–145)
Total Bilirubin: 0.6 mg/dL (ref 0.3–1.2)
Total Protein: 7.4 g/dL (ref 6.5–8.1)

## 2017-12-24 LAB — I-STAT TROPONIN, ED: TROPONIN I, POC: 0 ng/mL (ref 0.00–0.08)

## 2017-12-24 LAB — PROTIME-INR
INR: 1.01
Prothrombin Time: 13.2 seconds (ref 11.4–15.2)

## 2017-12-24 LAB — CBC
HCT: 42.6 % (ref 36.0–46.0)
Hemoglobin: 13.8 g/dL (ref 12.0–15.0)
MCH: 29.6 pg (ref 26.0–34.0)
MCHC: 32.4 g/dL (ref 30.0–36.0)
MCV: 91.4 fL (ref 78.0–100.0)
PLATELETS: 251 10*3/uL (ref 150–400)
RBC: 4.66 MIL/uL (ref 3.87–5.11)
RDW: 12.8 % (ref 11.5–15.5)
WBC: 6.9 10*3/uL (ref 4.0–10.5)

## 2017-12-24 LAB — DIFFERENTIAL
BASOS ABS: 0 10*3/uL (ref 0.0–0.1)
BASOS PCT: 0 %
Eosinophils Absolute: 0.1 10*3/uL (ref 0.0–0.7)
Eosinophils Relative: 2 %
Lymphocytes Relative: 10 %
Lymphs Abs: 0.7 10*3/uL (ref 0.7–4.0)
Monocytes Absolute: 0.3 10*3/uL (ref 0.1–1.0)
Monocytes Relative: 4 %
NEUTROS PCT: 84 %
Neutro Abs: 5.8 10*3/uL (ref 1.7–7.7)

## 2017-12-24 LAB — APTT: APTT: 26 s (ref 24–36)

## 2017-12-24 MED ORDER — MECLIZINE HCL 25 MG PO TABS: 25.0000 mg | ORAL_TABLET | Freq: Three times a day (TID) | ORAL | 0 refills | Status: DC | PRN

## 2017-12-24 MED ORDER — METOCLOPRAMIDE HCL 5 MG/ML IJ SOLN
5.0000 mg | Freq: Once | INTRAMUSCULAR | Status: AC
  Administered 2017-12-24: 5 mg via INTRAVENOUS
  Filled 2017-12-24: qty 2

## 2017-12-24 MED ORDER — SODIUM CHLORIDE 0.9 % IV BOLUS (SEPSIS)
1000.0000 mL | Freq: Once | INTRAVENOUS | Status: AC
  Administered 2017-12-24: 1000 mL via INTRAVENOUS

## 2017-12-24 MED ORDER — DIPHENHYDRAMINE HCL 50 MG/ML IJ SOLN
12.5000 mg | Freq: Once | INTRAMUSCULAR | Status: AC
  Administered 2017-12-24: 12.5 mg via INTRAVENOUS
  Filled 2017-12-24: qty 1

## 2017-12-24 MED ORDER — SODIUM CHLORIDE 0.9 % IV BOLUS (SEPSIS)
1000.0000 mL | Freq: Once | INTRAVENOUS | Status: AC
Start: 2017-12-24 — End: 2017-12-24
  Administered 2017-12-24: 1000 mL via INTRAVENOUS

## 2017-12-24 MED ORDER — MECLIZINE HCL 25 MG PO TABS
25.0000 mg | ORAL_TABLET | Freq: Once | ORAL | Status: AC
  Administered 2017-12-24: 25 mg via ORAL
  Filled 2017-12-24: qty 1

## 2017-12-24 NOTE — ED Notes (Signed)
Patient transported to MRI 

## 2017-12-24 NOTE — ED Triage Notes (Signed)
States she had a unsteady gait yest pm around 730p States she has problems with her blood sugar dropping . C/o feeling weak this am and states she still feels unsteady on her feet

## 2017-12-24 NOTE — Discharge Instructions (Signed)
Eat and drink well for the next couple days.  Please follow-up with your family physician.  Return for any worsening.

## 2017-12-24 NOTE — ED Provider Notes (Signed)
[facility] EMERGENCY DEPARTMENT Provider Note   CSN: [id number] Arrival date & time: 12/24/17  0160     History   Chief Complaint Chief Complaint  Patient presents with  . Dizziness    HPI Shirley Sanders is a 83 y.o. female.  19 F with a cc of unsteadiness.  Going on since yesterday afternoon.  The patient has had to make multiple trips to the bathroom because she has been drinking a lot of water and when she stands up to go she feels that she is very unsteady.  Feels that she wobbles back and forth and feels that she is going to fall down has to hold onto the walls on the way to the bathroom.  Usually improves with lying down or holding still.  Has some worsening with turning her head.  She denies head injury denies cough congestion or fever.  Has had some vomiting due to the dizziness.  She states she has been drinking well but has not been eating that well over the past few days.  She is unsure why.  She thinks that her blood sugar has been dropping because she has had a sensation that she feels anxious and shaky and then eats and it improves.  Has had multiple of these episodes over the past couple weeks but has not checked her blood sugar.  She denies chest pain or shortness of breath.  Has had some vomiting with the dizziness but denies vomiting prior.  Denies diarrhea.  Denies abdominal pain.  She has a mild diffuse headache.  Nothing seems to make that better or worse.   The history is provided by the patient.  Dizziness  Quality:  Lightheadedness, head spinning and imbalance Severity:  Severe Onset quality:  Gradual Duration:  1 day Timing:  Constant Progression:  Worsening Chronicity:  New Context: bending over, head movement and standing up   Relieved by:  Being still Worsened by:  Lying down, sitting upright and standing up Ineffective treatments:  None tried Associated symptoms: no chest pain, no headaches, no nausea, no palpitations, no shortness of  breath and no vomiting     Past Medical History:  Diagnosis Date  . Bronchitis   . CVA (cerebral infarction)    x 4  . Diabetes mellitus without complication ([place])   . GERD (gastroesophageal reflux disease)   . History of hiatal hernia   . Hyperlipidemia   . Hypertension   . Multiple allergies   . Murmur   . Osteoporosis   . Pneumonia   . Renal disorder   . Shingles   . Shingles   . Stroke [facility])    left sided sensory and motor deficits    Patient Active Problem List   Diagnosis Date Noted  . Osteoporosis 07/08/2015  . Diabetes mellitus with diabetic nephropathy ([place]) 04/06/2015  . History of CVA with residual deficit 10/22/2014  . CKD (chronic kidney disease) 10/22/2014  . Macular degeneration of both eyes 10/22/2014  . Anemia in chronic kidney disease 10/22/2014  . Hyperlipidemia 03/17/2014  . Hypertension 03/17/2014  . Diabetes mellitus, type 2 ([place]) 03/17/2014    Past Surgical History:  Procedure Laterality Date  . ABDOMINAL HYSTERECTOMY    . CATARACT EXTRACTION Bilateral 02/23/2016, 03/22/2016  . CATARACT EXTRACTION W/PHACO Left 03/01/2016   Procedure: CATARACT EXTRACTION PHACO AND INTRAOCULAR LENS PLACEMENT (IOC);  Surgeon: Eulogio Bear, MD;  Location: [facility] ORS;  Service: Ophthalmology;  Laterality: Left;  [place] 1.09 AP% 14.7 CDE  10.13 FLUID BAG LOT # [id number]  . CATARACT EXTRACTION W/PHACO Right 03/22/2016   Procedure: CATARACT EXTRACTION PHACO AND INTRAOCULAR LENS PLACEMENT (IOC);  Surgeon: Eulogio Bear, MD;  Location: [facility] ORS;  Service: Ophthalmology;  Laterality: Right;  Lot# [id number] H [place]: 01:19.5 AP%: 20.8 CDE: 16.57   . kidney stones    . KNEE SURGERY Right 03/29/2016  . TONSILLECTOMY      OB History    No data available       Home Medications    Prior to Admission medications   Medication Sig Start Date End Date Taking? Authorizing Provider  aspirin EC 81 MG tablet Take 1 tablet (81 mg total) by mouth daily. 03/18/14  Yes Effie Berkshire, MD  cetirizine (ZYRTEC) 10 MG tablet Take 10 mg by mouth daily.   Yes [provider]  chlorthalidone (HYGROTON) 25 MG tablet Take 12.5 mg by mouth every morning.   Yes [provider]  GLIPIZIDE XL 5 MG 24 hr tablet TAKE 1 TABLET BY MOUTH ONCE DAILY WITH BREAKFAST FOR DIABETES Patient taking differently: TAKE 1 TABLET (5mg ) BY MOUTH ONCE DAILY WITH BREAKFAST FOR DIABETES 11/25/17  Yes Eulas Post, Monica, DO  lisinopril (PRINIVIL,ZESTRIL) 2.5 MG tablet TAKE 1 TABLET BY MOUTH ONCE DAILY Patient taking differently: TAKE 1 TABLET (2.5mg ) BY MOUTH ONCE DAILY 10/06/17  Yes Gildardo Cranker, DO  Multiple Vitamins-Minerals (PRESERVISION AREDS) CAPS Take 1 capsule by mouth 2 (two) times daily.    Yes [provider]  potassium chloride SA (K-DUR,KLOR-CON) 20 MEQ tablet Take 20 mEq by mouth at bedtime.    Yes [provider]  simvastatin (ZOCOR) 10 MG tablet TAKE ONE TABLET BY MOUTH AT BEDTIME FOR  ELEVATED  CHOLESTEROL. Patient taking differently: Take 10 mg by mouth at bedtime.  08/17/16  Yes Gildardo Cranker, DO  meclizine (ANTIVERT) 25 MG tablet Take 1 tablet (25 mg total) by mouth 3 (three) times daily as needed for dizziness. 12/24/17   Deno Etienne, DO    Family History Family History  Problem Relation Age of Onset  . Stroke Mother   . Hypertension Father   . Kidney disease Father   . Diabetes Father   . Cancer Father   . Diabetes Sister   . Cancer Sister        lung  . Stroke Sister   . Heart disease Brother   . Diabetes Brother   . Heart disease Brother   . Stroke Brother   . Diabetes Brother   . Stroke Brother     Social History Social History   Tobacco Use  . Smoking status: Never Smoker  . Smokeless tobacco: Never Used  Substance Use Topics  . Alcohol use: No  . Drug use: No     Allergies   Codeine; Contrast media [iodinated diagnostic agents]; Fish allergy; and Milk-related compounds   Review of Systems Review of Systems    Constitutional: Negative for chills and fever.  HENT: Negative for congestion and rhinorrhea.   Eyes: Negative for redness and visual disturbance.  Respiratory: Negative for shortness of breath and wheezing.   Cardiovascular: Negative for chest pain and palpitations.  Gastrointestinal: Negative for nausea and vomiting.  Genitourinary: Negative for dysuria and urgency.  Musculoskeletal: Positive for gait problem. Negative for arthralgias and myalgias.  Skin: Negative for pallor and wound.  Neurological: Positive for dizziness, facial asymmetry and light-headedness. Negative for headaches.     Physical Exam Updated Vital Signs BP (!) 146/82   Pulse 84  Temp 97.8 F (36.6 C) (Oral)   Resp 16   Ht 5' (1.524 m)   Wt 61.2 kg (135 lb)   SpO2 98%   BMI 26.37 kg/m   Physical Exam  Constitutional: She is oriented to person, place, and time. She appears well-developed and well-nourished. No distress.  HENT:  Head: Normocephalic and atraumatic.  Eyes: EOM are normal. Pupils are equal, round, and reactive to light.  Neck: Normal range of motion. Neck supple.  Cardiovascular: Normal rate and regular rhythm. Exam reveals no gallop and no friction rub.  No murmur heard. Pulmonary/Chest: Effort normal. She has no wheezes. She has no rales.  Abdominal: Soft. She exhibits no distension. There is no tenderness.  Musculoskeletal: She exhibits no edema or tenderness.  Neurological: She is alert and oriented to person, place, and time. No cranial nerve deficit or sensory deficit.  4/5 LUE, LLE compared to R, unchanged from old cva per patient  Difficult exam due to macular degeneration  Skin: Skin is warm and dry. She is not diaphoretic.  Psychiatric: She has a normal mood and affect. Her behavior is normal.  Nursing note and vitals reviewed.    ED Treatments / Results  Labs (all labs ordered are listed, but only abnormal results are displayed) Labs Reviewed  COMPREHENSIVE METABOLIC  PANEL - Abnormal; Notable for the following components:      Result Value   Glucose, Bld 198 (*)    Creatinine, Ser 1.21 (*)    ALT 13 (*)    GFR calc non Af Amer 41 (*)    GFR calc Af Amer 48 (*)    All other components within normal limits  URINALYSIS, ROUTINE W REFLEX MICROSCOPIC - Abnormal; Notable for the following components:   Color, Urine STRAW (*)    Glucose, UA 50 (*)    All other components within normal limits  I-STAT CHEM 8, ED - Abnormal; Notable for the following components:   BUN 21 (*)    Creatinine, Ser 1.10 (*)    Glucose, Bld 193 (*)    All other components within normal limits  PROTIME-INR  APTT  CBC  DIFFERENTIAL  I-STAT TROPONIN, ED  CBG MONITORING, ED    EKG  EKG Interpretation  Date/Time:  Tuesday December 24 2017 06:48:06 EDT Ventricular Rate:  92 PR Interval:  182 QRS Duration: 84 QT Interval:  364 QTC Calculation: 450 R Axis:   74 Text Interpretation:  Normal sinus rhythm Normal ECG No significant change since last tracing Confirmed by Deno Etienne [phone]) on 12/24/2017 8:07:12 AM       Radiology Mr Brain Wo Contrast  Result Date: 12/24/2017 CLINICAL DATA:  Persistent central vertigo.  Rule out stroke EXAM: MRI HEAD WITHOUT CONTRAST TECHNIQUE: Multiplanar, multiecho pulse sequences of the brain and surrounding structures were obtained without intravenous contrast. COMPARISON:  MRI head 03/17/2014 FINDINGS: Brain: Negative for acute infarct.  Negative for  mass Mild to moderate atrophy. Moderate chronic microvascular ischemic change throughout the white matter as well as in the basal ganglia and thalamus bilaterally. Chronic hemorrhagic lacunar infarction right external capsule posteriorly. Vascular: Normal arterial flow void Skull and upper cervical spine: Negative Sinuses/Orbits: Mild mucosal edema paranasal sinuses. Bilateral cataract removal Other: None IMPRESSION: Negative for acute infarct Atrophy and moderate chronic ischemic change.  Electronically Signed   By: Franchot Gallo M.D.   On: 12/24/2017 10:49    Procedures Procedures (including critical care time)  Medications Ordered in ED Medications  sodium chloride 0.9 %  bolus 1,000 mL (0 mLs Intravenous Stopped 12/24/17 1209)  meclizine (ANTIVERT) tablet 25 mg (25 mg Oral Given 12/24/17 0935)  metoCLOPramide (REGLAN) injection 5 mg (5 mg Intravenous Given 12/24/17 0934)  diphenhydrAMINE (BENADRYL) injection 12.5 mg (12.5 mg Intravenous Given 12/24/17 0934)  sodium chloride 0.9 % bolus 1,000 mL (1,000 mLs Intravenous New Bag/Given 12/24/17 1145)     Initial Impression / Assessment and Plan / ED Course  I have reviewed the triage vital signs and the nursing notes.  Pertinent labs & imaging results that were available during my care of the patient were reviewed by me and considered in my medical decision making (see chart for details).     83 yo F with a chief complaint of feeling lightheaded and unsteady.  This been going on since yesterday afternoon.  Worse when she tries to ambulate.  She also has some worsening when she turns her head.  Clinically the patient appears to be orthostatic with an increase of her heart rate about 20 beats when she stood up.  The patient however has had 4 strokes previously and has some unsteadiness with her gait.  Will discuss with neurology.  Discussed with neuro.  Recommended MR brain.  The patient's MRI is negative.  The patient is feeling better after IV fluids.  Is able to ambulate with some mild assistance and still feels somewhat dizzy.  She is given a second liter with significant improvement.  Discharge home.  1:25 PM:  I have discussed the diagnosis/risks/treatment options with the patient and family and believe the pt to be eligible for discharge home to follow-up with PCP. We also discussed returning to the ED immediately if new or worsening sx occur. We discussed the sx which are most concerning (e.g., sudden worsening pain, fever,  inability to tolerate by mouth) that necessitate immediate return. Medications administered to the patient during their visit and any new prescriptions provided to the patient are listed below.  Medications given during this visit Medications  sodium chloride 0.9 % bolus 1,000 mL (0 mLs Intravenous Stopped 12/24/17 1209)  meclizine (ANTIVERT) tablet 25 mg (25 mg Oral Given 12/24/17 0935)  metoCLOPramide (REGLAN) injection 5 mg (5 mg Intravenous Given 12/24/17 0934)  diphenhydrAMINE (BENADRYL) injection 12.5 mg (12.5 mg Intravenous Given 12/24/17 0934)  sodium chloride 0.9 % bolus 1,000 mL (1,000 mLs Intravenous New Bag/Given 12/24/17 1145)     The patient appears reasonably screen and/or stabilized for discharge and I doubt any other medical condition or other [facility] requiring further screening, evaluation, or treatment in the ED at this time prior to discharge.    Final Clinical Impressions(s) / ED Diagnoses   Final diagnoses:  Orthostatic hypotension  Vertigo    ED Discharge Orders        Ordered    meclizine (ANTIVERT) 25 MG tablet  3 times daily PRN     12/24/17 1321       Deno Etienne, DO 12/24/17 1325

## 2017-12-25 ENCOUNTER — Encounter: Payer: Self-pay | Admitting: Nurse Practitioner

## 2017-12-25 ENCOUNTER — Ambulatory Visit (INDEPENDENT_AMBULATORY_CARE_PROVIDER_SITE_OTHER): Payer: Medicare Other | Admitting: Nurse Practitioner

## 2017-12-25 VITALS — BP 144/82 | HR 77 | Temp 98.1°F | Ht 60.0 in | Wt 139.0 lb

## 2017-12-25 DIAGNOSIS — I6930 Unspecified sequelae of cerebral infarction: Secondary | ICD-10-CM | POA: Diagnosis not present

## 2017-12-25 DIAGNOSIS — R42 Dizziness and giddiness: Secondary | ICD-10-CM | POA: Diagnosis not present

## 2017-12-25 DIAGNOSIS — E1121 Type 2 diabetes mellitus with diabetic nephropathy: Secondary | ICD-10-CM

## 2017-12-25 NOTE — Patient Instructions (Signed)
To make appt to see Shirley Sanders, pharm D for new pt- nutritional counseling and to discuss blood sugar monitoring- pt with macular degeneration

## 2017-12-25 NOTE — Progress Notes (Signed)
Careteam: Patient Care Team: Gildardo Cranker, DO as PCP - General (Internal Medicine) Milus Height, MD as Referring Physician (Ophthalmology) Rexene Agent, MD as Attending Physician (Nephrology) Gildardo Cranker, DO as Consulting Physician (Internal Medicine) Eulogio Bear, MD as Consulting Physician (Ophthalmology)  Advanced Directive information    Allergies  Allergen Reactions  . Codeine Other (See Comments)    hbp  . Contrast Media [Iodinated Diagnostic Agents]   . Fish Allergy   . Milk-Related Compounds     Chief Complaint  Patient presents with  . Follow-up    [facility] ED follow up. Pt was seen in ED on 12/24/17 due to orthostatic hypotension and vertigo. Pt states that she is still dizzy today and has a headache.   . Other    Daughter in room     HPI: Patient is a 83 y.o. female seen in the office today to follow up ED. Pt went to ED due to dizziness and feeling unsteady. Pt with hx of CVA, DM, chronic fatigue, MD, CKD, OP, HTN, hyperlipidemia.  Pt had MR of brain which was negative and felt better after IV fluids felt like she was orthostatic and dx with vertigo as well.  Given Rx for meclizine PRN.  Pt reports she has had a hx of carbon monoxide exposure which caused dizziness and hx of CVA so she was worried this could be recurring.  Has carbon monoxide detector at this time and functional.  Took her to ED to rule out CVA. Continue to be dizzy but has improved- she was falling over things when it started.  Still having headache.  Negative for sinus congestion- hx of sinus issues.  Has year round allergies and takes zyrtec daily.  Drinking 58 oz of fluid a day however ED told her she was dehydrated.   2 months ago states it started that her blood sugar started to drop.  Taking glipizide 5 mg at breakfast- eats a biscuit 3 times a week for breakfast, no meat or breakfast bar or toast.  Will eat chocolate midday to avoid hypoglycemia Can not see test  strip due to her MD.   Review of Systems:  Review of Systems  Constitutional: Positive for malaise/fatigue. Negative for chills and fever.  HENT: Negative for congestion, ear discharge, ear pain, hearing loss, sinus pain, sore throat and tinnitus.   Eyes: Negative for blurred vision.       Hx of MD  Respiratory: Negative for cough, sputum production, shortness of breath and wheezing.   Cardiovascular: Negative for chest pain.  Neurological: Positive for dizziness and headaches. Negative for tingling, sensory change and weakness.    Past Medical History:  Diagnosis Date  . Bronchitis   . CVA (cerebral infarction)    x 4  . Diabetes mellitus without complication ([place])   . GERD (gastroesophageal reflux disease)   . History of hiatal hernia   . Hyperlipidemia   . Hypertension   . Multiple allergies   . Murmur   . Osteoporosis   . Pneumonia   . Renal disorder   . Shingles   . Shingles   . Stroke [facility])    left sided sensory and motor deficits   Past Surgical History:  Procedure Laterality Date  . ABDOMINAL HYSTERECTOMY    . CATARACT EXTRACTION Bilateral 02/23/2016, 03/22/2016  . CATARACT EXTRACTION W/PHACO Left 03/01/2016   Procedure: CATARACT EXTRACTION PHACO AND INTRAOCULAR LENS PLACEMENT (IOC);  Surgeon: Eulogio Bear, MD;  Location: [facility] ORS;  Service: Ophthalmology;  Laterality: Left;  [place] 1.09 AP% 14.7 CDE 10.13 FLUID BAG LOT # [id number]  . CATARACT EXTRACTION W/PHACO Right 03/22/2016   Procedure: CATARACT EXTRACTION PHACO AND INTRAOCULAR LENS PLACEMENT ([place]);  Surgeon: Eulogio Bear, MD;  Location: [facility] ORS;  Service: Ophthalmology;  Laterality: Right;  Lot# [id number] H [place]: 01:19.5 AP%: 20.8 CDE: 16.57   . kidney stones    . KNEE SURGERY Right 03/29/2016  . TONSILLECTOMY     Social History:   reports that  has never smoked. she has never used smokeless tobacco. She reports that she does not drink alcohol or use drugs.  Family History  Problem Relation Age of  Onset  . Stroke Mother   . Hypertension Father   . Kidney disease Father   . Diabetes Father   . Cancer Father   . Diabetes Sister   . Cancer Sister        lung  . Stroke Sister   . Heart disease Brother   . Diabetes Brother   . Heart disease Brother   . Stroke Brother   . Diabetes Brother   . Stroke Brother     Medications: Patient's Medications  New Prescriptions   No medications on file  Previous Medications   ASPIRIN EC 81 MG TABLET    Take 1 tablet (81 mg total) by mouth daily.   CETIRIZINE (ZYRTEC) 10 MG TABLET    Take 10 mg by mouth daily.   CHLORTHALIDONE (HYGROTON) 25 MG TABLET    Take 12.5 mg by mouth every morning.   GLIPIZIDE XL 5 MG 24 HR TABLET    TAKE 1 TABLET BY MOUTH ONCE DAILY WITH BREAKFAST FOR DIABETES   LISINOPRIL (PRINIVIL,ZESTRIL) 2.5 MG TABLET    TAKE 1 TABLET BY MOUTH ONCE DAILY   MECLIZINE (ANTIVERT) 25 MG TABLET    Take 1 tablet (25 mg total) by mouth 3 (three) times daily as needed for dizziness.   MULTIPLE VITAMINS-MINERALS (PRESERVISION AREDS) CAPS    Take 1 capsule by mouth 2 (two) times daily.    POTASSIUM CHLORIDE SA (K-DUR,KLOR-CON) 20 MEQ TABLET    Take 20 mEq by mouth at bedtime.    SIMVASTATIN (ZOCOR) 10 MG TABLET    TAKE ONE TABLET BY MOUTH AT BEDTIME FOR  ELEVATED  CHOLESTEROL.  Modified Medications   No medications on file  Discontinued Medications   No medications on file     Physical Exam:  Vitals:   12/25/17 1455  BP: (!) 144/82  Pulse: 77  Temp: 98.1 F (36.7 C)  TempSrc: Oral  SpO2: 99%  Weight: 139 lb (63 kg)  Height: 5' (1.524 m)   Body mass index is 27.15 kg/m.  Physical Exam  Constitutional: She is oriented to person, place, and time. She appears well-developed and well-nourished. No distress.  HENT:  Head: Normocephalic and atraumatic.  Right Ear: External ear normal.  Left Ear: External ear normal.  Nose: Nose normal.  Mouth/Throat: Oropharynx is clear and moist. No oropharyngeal exudate.  Eyes:  Conjunctivae are normal. Pupils are equal, round, and reactive to light.  Neck: Normal range of motion. Neck supple.  Cardiovascular: Normal rate, regular rhythm and normal heart sounds.  Pulmonary/Chest: Effort normal and breath sounds normal.  Abdominal: Soft. Bowel sounds are normal.  Musculoskeletal: She exhibits no edema or tenderness.  Neurological: She is alert and oriented to person, place, and time.  Skin: Skin is warm and dry. She is not diaphoretic.  Psychiatric: She has a normal  mood and affect.    Labs reviewed: Basic Metabolic Panel: Recent Labs    03/18/17 0828 08/16/17 0809 12/24/17 0657 12/24/17 0712  NA 143 141 139 141  K 4.5 4.0 3.5 3.5  CL 106 104 103 101  CO2 27 30 25   --   GLUCOSE 132* 128* 198* 193*  BUN 20 18 19  21*  CREATININE 1.15* 1.24* 1.21* 1.10*  CALCIUM 9.2 9.3 9.4  --    Liver Function Tests: Recent Labs    03/18/17 0828 08/16/17 0809 12/24/17 0657  AST  --  16 22  ALT 13 10 13*  ALKPHOS  --   --  86  BILITOT  --  0.5 0.6  PROT  --  6.7 7.4  ALBUMIN  --   --  4.1   No results for input(s): LIPASE, AMYLASE in the last 8760 hours. No results for input(s): AMMONIA in the last 8760 hours. CBC: Recent Labs    12/24/17 0657 12/24/17 0712  WBC 6.9  --   NEUTROABS 5.8  --   HGB 13.8 15.0  HCT 42.6 44.0  MCV 91.4  --   PLT 251  --    Lipid Panel: Recent Labs    03/18/17 0828 08/16/17 0809  CHOL 153 163  HDL 54 58  LDLCALC 77 85  TRIG 108 108  CHOLHDL 2.8 2.8   TSH: No results for input(s): TSH in the last 8760 hours. A1C: Lab Results  Component Value Date   HGBA1C 6.4 (H) 08/16/2017     Assessment/Plan 1. History of CVA with residual deficit -has been doing well, recent dizziness with ED work up, recommended neurology follow up, she has not seen neurologist since 2015 but agreeable to follow up.  - Ambulatory referral to Neurology  2. Type 2 diabetes mellitus with diabetic nephropathy, without long-term current use  of insulin (HCC) -will have pt meet with Tivis Ringer for education on diet- not eating appropriately and feels like she has hypoglycemic episodes in the morning.  - Hemoglobin A1c  3. Vertigo -improved, ED work-up negative; encouraged to change position slowly and to keep well hydrated, appears to be BPPV or related to quick positional change. Education provided. -to continue meclizine PRN dizziness  - Ambulatory referral to Neurology  Next appt: 01/06/2018 Janett Billow K. [place], [facility] [phone]

## 2017-12-26 LAB — HEMOGLOBIN A1C
EAG (MMOL/L): 7.4 (calc)
Hgb A1c MFr Bld: 6.3 % of total Hgb — ABNORMAL HIGH (ref <5.7)
MEAN PLASMA GLUCOSE: 134 (calc)

## 2017-12-27 ENCOUNTER — Encounter: Payer: Self-pay | Admitting: Neurology

## 2018-01-06 ENCOUNTER — Ambulatory Visit: Payer: Medicare Other | Admitting: Pharmacotherapy

## 2018-01-13 ENCOUNTER — Ambulatory Visit: Payer: Self-pay | Admitting: Pharmacotherapy

## 2018-01-29 ENCOUNTER — Other Ambulatory Visit: Payer: Medicare Other

## 2018-01-29 DIAGNOSIS — E1121 Type 2 diabetes mellitus with diabetic nephropathy: Secondary | ICD-10-CM

## 2018-01-29 DIAGNOSIS — E782 Mixed hyperlipidemia: Secondary | ICD-10-CM | POA: Diagnosis not present

## 2018-01-29 LAB — LIPID PANEL
Cholesterol: 230 mg/dL — ABNORMAL HIGH (ref <200)
HDL: 60 mg/dL (ref >50)
LDL Cholesterol (Calc): 147 mg/dL (calc) — ABNORMAL HIGH
Non-HDL Cholesterol (Calc): 170 mg/dL (calc) — ABNORMAL HIGH (ref <130)
TRIGLYCERIDES: 113 mg/dL (ref <150)
Total CHOL/HDL Ratio: 3.8 (calc) (ref <5.0)

## 2018-01-29 LAB — BASIC METABOLIC PANEL WITH GFR
BUN/Creatinine Ratio: 14 (calc) (ref 6–22)
BUN: 19 mg/dL (ref 7–25)
CALCIUM: 9.4 mg/dL (ref 8.6–10.4)
CO2: 28 mmol/L (ref 20–32)
CREATININE: 1.33 mg/dL — AB (ref 0.60–0.88)
Chloride: 103 mmol/L (ref 98–110)
GFR, EST AFRICAN AMERICAN: 44 mL/min/{1.73_m2} — AB (ref >=60)
GFR, EST NON AFRICAN AMERICAN: 38 mL/min/{1.73_m2} — AB (ref >=60)
Glucose, Bld: 137 mg/dL — ABNORMAL HIGH (ref 65–99)
Potassium: 4.4 mmol/L (ref 3.5–5.3)
Sodium: 139 mmol/L (ref 135–146)

## 2018-01-29 LAB — ALT: ALT: 11 U/L (ref 6–29)

## 2018-01-30 LAB — HEMOGLOBIN A1C
HEMOGLOBIN A1C: 6.5 %{Hb} — AB (ref <5.7)
Mean Plasma Glucose: 140 (calc)
eAG (mmol/L): 7.7 (calc)

## 2018-01-31 ENCOUNTER — Encounter: Payer: Self-pay | Admitting: Internal Medicine

## 2018-01-31 ENCOUNTER — Ambulatory Visit (INDEPENDENT_AMBULATORY_CARE_PROVIDER_SITE_OTHER): Payer: Medicare Other | Admitting: Internal Medicine

## 2018-01-31 VITALS — BP 134/86 | HR 85 | Temp 97.9°F | Ht 60.0 in | Wt 133.0 lb

## 2018-01-31 DIAGNOSIS — E11649 Type 2 diabetes mellitus with hypoglycemia without coma: Secondary | ICD-10-CM

## 2018-01-31 DIAGNOSIS — R42 Dizziness and giddiness: Secondary | ICD-10-CM | POA: Diagnosis not present

## 2018-01-31 DIAGNOSIS — E782 Mixed hyperlipidemia: Secondary | ICD-10-CM | POA: Diagnosis not present

## 2018-01-31 DIAGNOSIS — I10 Essential (primary) hypertension: Secondary | ICD-10-CM

## 2018-01-31 DIAGNOSIS — I6930 Unspecified sequelae of cerebral infarction: Secondary | ICD-10-CM | POA: Diagnosis not present

## 2018-01-31 DIAGNOSIS — R5382 Chronic fatigue, unspecified: Secondary | ICD-10-CM

## 2018-01-31 MED ORDER — MECLIZINE HCL 25 MG PO TABS: 25.0000 mg | ORAL_TABLET | Freq: Three times a day (TID) | ORAL | 3 refills | Status: DC | PRN

## 2018-01-31 MED ORDER — FREESTYLE LIBRE READER DEVI: 1.0000 | 0 refills | Status: DC | PRN

## 2018-01-31 MED ORDER — FREESTYLE LIBRE 14 DAY SENSOR MISC: 1.0000 | 11 refills | Status: DC

## 2018-01-31 NOTE — Patient Instructions (Addendum)
STOP GLIPIZIDE  Continue other medications as ordered  Follow up as scheduled with neurology - may need vestibular rehab  Follow up in 3 mos for DM, HTN, hyperlipidemia, vertigo. Fasting labs prior to appt

## 2018-01-31 NOTE — Progress Notes (Signed)
Patient ID: Shirley Sanders, female   DOB: May 12, 1935, 83 y.o.   MRN: [id number]   Location:  [facility] OFFICE  Provider: DR Arletha Grippe  Code Status:  Goals of Care:  Advanced Directives 12/24/2017  Does Patient Have a Medical Advance Directive? No  Would patient like information on creating a medical advance directive? -  Pre-existing out of facility DNR order (yellow form or pink MOST form) -     Chief Complaint  Patient presents with  . Medical Management of Chronic Issues    5 month follow-up on DM, HTN, CKD, and hyperlipidemia. Patient c/o ongoing vertigo( mainly when laying down) and low blood sugar readings, and up/down blood pressure.   . Medication Refill    Refill Meclizine if patient to continue, not working as expected   . FYI    Patient with spells of really bad headaches across front of head, last episode was yesterday, told ER doctor.     HPI: Patient is a 83 y.o. female seen today for medical management of chronic diseases.  She was seen in the ED in March 2019 for vertigo and dehydration. She was given IVF which helped but she continues to have dizziness. Dizziness occurs with change in head position. She needs refill on meclizine. She has macular degeneration and is unable to see to perform FSGS and requests rx for freestyle libre. She has frequent low BS reactions. A1c 6.5%. Weight down 6 lbs in last month. BP at home 140s/80s prior to meds and 130s/70s in the PM.   She would like to try gingko biloba for vertigo  DM - she has a hard time checking BS at home due to poor vision. She has frequent low BS reactions. Last A1c 6.5%. No numbness or tingling. She takes glipizide. urine micro/Cr ratio 17  HTN/hyperlipidemia - stable. she takes chlorthalidone and occasionally has leg cramps. She drinks approx 6 glasses of water per day. She likes Aquafina. LDL 147; HDL 60  Hx CVA - stable on ASA. No new deficits  Osteoporosis - takes vit D3. Last DXA in 2005  CKD - stage 3.  Cr 1.33; takes Vit D3 and B12. Followed by nephrology.  Macular degeneration - unchanged. followed by eye specialist. Takes eye vitamins and gets prn steroid injections. She OU cats with IOL lenses due to worsening eye pressure. Vision not improved since procedure. She continues to have OS tearing  Chronic fatigue - unchanged. she was unable to f/u with Integrative specialist due to cost. She continues to get really tired everyday at 12:15    Past Medical History:  Diagnosis Date  . Bronchitis   . CVA (cerebral infarction)    x 4  . Diabetes mellitus without complication ([place])   . GERD (gastroesophageal reflux disease)   . History of hiatal hernia   . Hyperlipidemia   . Hypertension   . Multiple allergies   . Murmur   . Osteoporosis   . Pneumonia   . Renal disorder   . Shingles   . Shingles   . Stroke [facility])    left sided sensory and motor deficits    Past Surgical History:  Procedure Laterality Date  . ABDOMINAL HYSTERECTOMY    . CATARACT EXTRACTION Bilateral 02/23/2016, 03/22/2016  . CATARACT EXTRACTION W/PHACO Left 03/01/2016   Procedure: CATARACT EXTRACTION PHACO AND INTRAOCULAR LENS PLACEMENT (IOC);  Surgeon: Eulogio Bear, MD;  Location: [facility] ORS;  Service: Ophthalmology;  Laterality: Left;  [place] 1.09 AP% 14.7 CDE 10.13  FLUID BAG LOT # [id number]  . CATARACT EXTRACTION W/PHACO Right 03/22/2016   Procedure: CATARACT EXTRACTION PHACO AND INTRAOCULAR LENS PLACEMENT (IOC);  Surgeon: Eulogio Bear, MD;  Location: [facility] ORS;  Service: Ophthalmology;  Laterality: Right;  Lot# [id number] H [place]: 01:19.5 AP%: 20.8 CDE: 16.57   . kidney stones    . KNEE SURGERY Right 03/29/2016  . TONSILLECTOMY       reports that she has never smoked. She has never used smokeless tobacco. She reports that she does not drink alcohol or use drugs. Social History   Socioeconomic History  . Marital status: Divorced    Spouse name: Not on file  . Number of children: 2  . Years of education:  11th  . Highest education level: Not on file  Occupational History  . Occupation: Investment banker, operational  . Financial resource strain: Not on file  . Food insecurity:    Worry: Not on file    Inability: Not on file  . Transportation needs:    Medical: Not on file    Non-medical: Not on file  Tobacco Use  . Smoking status: Never Smoker  . Smokeless tobacco: Never Used  Substance and Sexual Activity  . Alcohol use: No  . Drug use: No  . Sexual activity: Never  Lifestyle  . Physical activity:    Days per week: Not on file    Minutes per session: Not on file  . Stress: Not on file  Relationships  . Social connections:    Talks on phone: Not on file    Gets together: Not on file    Attends religious service: Not on file    Active member of club or organization: Not on file    Attends meetings of clubs or organizations: Not on file    Relationship status: Not on file  . Intimate partner violence:    Fear of current or ex partner: Not on file    Emotionally abused: Not on file    Physically abused: Not on file    Forced sexual activity: Not on file  Other Topics Concern  . Not on file  Social History Narrative   Patient lives at home alone, one stories, no pets   Patient right handed   Patient drinks coffee and diet coke   Past profession- Office    Family History  Problem Relation Age of Onset  . Stroke Mother   . Hypertension Father   . Kidney disease Father   . Diabetes Father   . Cancer Father   . Diabetes Sister   . Cancer Sister        lung  . Stroke Sister   . Heart disease Brother   . Diabetes Brother   . Heart disease Brother   . Stroke Brother   . Diabetes Brother   . Stroke Brother     Allergies  Allergen Reactions  . Codeine Other (See Comments)    hbp  . Contrast Media [Iodinated Diagnostic Agents]   . Fish Allergy   . Milk-Related Compounds     Outpatient Encounter Medications as of 01/31/2018  Medication Sig  . aspirin EC 81 MG  tablet Take 1 tablet (81 mg total) by mouth daily.  . cetirizine (ZYRTEC) 10 MG tablet Take 10 mg by mouth daily.  . chlorthalidone (HYGROTON) 25 MG tablet Take 12.5 mg by mouth every morning.  Marland Kitchen GLIPIZIDE XL 5 MG 24 hr tablet TAKE 1 TABLET BY MOUTH ONCE DAILY  WITH BREAKFAST FOR DIABETES  . lisinopril (PRINIVIL,ZESTRIL) 2.5 MG tablet TAKE 1 TABLET BY MOUTH ONCE DAILY  . meclizine (ANTIVERT) 25 MG tablet Take 1 tablet (25 mg total) by mouth 3 (three) times daily as needed for dizziness.  . Multiple Vitamins-Minerals (PRESERVISION AREDS) CAPS Take 1 capsule by mouth 2 (two) times daily.   . potassium chloride SA (K-DUR,KLOR-CON) 20 MEQ tablet Take 20 mEq by mouth at bedtime.   . simvastatin (ZOCOR) 10 MG tablet TAKE ONE TABLET BY MOUTH AT BEDTIME FOR  ELEVATED  CHOLESTEROL.   No facility-administered encounter medications on file as of 01/31/2018.     Review of Systems:  Review of Systems  Eyes: Positive for visual disturbance.  Neurological: Positive for dizziness.  All other systems reviewed and are negative.   Health Maintenance  Topic Date Due  . TETANUS/TDAP  10/08/2018 (Originally 03/30/1954)  . INFLUENZA VACCINE  05/08/2018  . OPHTHALMOLOGY EXAM  07/08/2018  . HEMOGLOBIN A1C  07/31/2018  . FOOT EXAM  08/21/2018  . DEXA SCAN  Completed  . PNA vac Low Risk Adult  Completed    Physical Exam: Vitals:   01/31/18 1146  BP: 134/86  Pulse: 85  Temp: 97.9 F (36.6 C)  TempSrc: Oral  SpO2: 94%  Weight: 133 lb (60.3 kg)  Height: 5' (1.524 m)   Body mass index is 25.97 kg/m. Physical Exam  Constitutional: She is oriented to person, place, and time. She appears well-developed and well-nourished.  HENT:  Mouth/Throat: Oropharynx is clear and moist. No oropharyngeal exudate.  Eyes: Pupils are equal, round, and reactive to light. No scleral icterus.  Neck: Neck supple. Carotid bruit is not present. No tracheal deviation present. No thyromegaly present.  Cardiovascular: Normal  rate, regular rhythm and intact distal pulses. Exam reveals no gallop and no friction rub.  Murmur (1/6 SEM) heard. No LE edema b/l. no calf TTP.   Pulmonary/Chest: Effort normal and breath sounds normal. No stridor. No respiratory distress. She has no wheezes. She has no rales.  Abdominal: Soft. Bowel sounds are normal. She exhibits no distension and no mass. There is no hepatomegaly. There is no tenderness. There is no rebound and no guarding.  Musculoskeletal: She exhibits edema.  Lymphadenopathy:    She has no cervical adenopathy.  Neurological: She is alert and oriented to person, place, and time. She has normal reflexes.  Skin: Skin is warm and dry. No rash noted.  Psychiatric: She has a normal mood and affect. Her behavior is normal. Judgment and thought content normal.    Labs reviewed: Basic Metabolic Panel: Recent Labs    08/16/17 0809 12/24/17 0657 12/24/17 0712 01/29/18 0806  NA 141 139 141 139  K 4.0 3.5 3.5 4.4  CL 104 103 101 103  CO2 30 25  --  28  GLUCOSE 128* 198* 193* 137*  BUN 18 19 21* 19  CREATININE 1.24* 1.21* 1.10* 1.33*  CALCIUM 9.3 9.4  --  9.4   Liver Function Tests: Recent Labs    08/16/17 0809 12/24/17 0657 01/29/18 0806  AST 16 22  --   ALT 10 13* 11  ALKPHOS  --  86  --   BILITOT 0.5 0.6  --   PROT 6.7 7.4  --   ALBUMIN  --  4.1  --    No results for input(s): LIPASE, AMYLASE in the last 8760 hours. No results for input(s): AMMONIA in the last 8760 hours. CBC: Recent Labs    12/24/17 0657 12/24/17  5631  WBC 6.9  --   NEUTROABS 5.8  --   HGB 13.8 15.0  HCT 42.6 44.0  MCV 91.4  --   PLT 251  --    Lipid Panel: Recent Labs    03/18/17 0828 08/16/17 0809 01/29/18 0806  CHOL 153 163 230*  HDL 54 58 60  LDLCALC 77 85 147*  TRIG 108 108 113  CHOLHDL 2.8 2.8 3.8   Lab Results  Component Value Date   HGBA1C 6.5 (H) 01/29/2018    Procedures since last visit: No results found.  Assessment/Plan     ICD-10-CM   1.  Hypoglycemia due to type 2 diabetes mellitus ([place]) E11.649   2. Chronic fatigue R53.82   3. History of CVA with residual deficit I69.30   4. Vertigo R42 meclizine (ANTIVERT) 25 MG tablet  5. Essential hypertension I10   6. Mixed hyperlipidemia E78.2     Stop protein bar  She declines Dix Hallpike maneuver today  She declines vestibular rehab at this time  STOP GLIPIZIDE  Continue other medications as ordered  Follow up as scheduled with neurology - may need vestibular rehab  Follow up in 3 mos for DM, HTN, hyperlipidemia, vertigo. Fasting labs prior to appt   [place] S. Perlie Gold  [facility] [address] [place],  [address] [phone] Cell (Monday-Friday 8 AM - 5 PM) [phone] After 5 PM and follow prompts

## 2018-02-02 ENCOUNTER — Encounter: Payer: Self-pay | Admitting: Internal Medicine

## 2018-02-03 MED ORDER — FREESTYLE LIBRE 14 DAY SENSOR MISC: 1.0000 | 11 refills | Status: DC

## 2018-02-03 MED ORDER — FREESTYLE LIBRE READER DEVI: 1.0000 | 0 refills | Status: DC | PRN

## 2018-02-05 MED ORDER — FREESTYLE LIBRE 14 DAY READER DEVI: 1.0000 | Freq: Three times a day (TID) | 0 refills | Status: AC

## 2018-02-05 NOTE — Addendum Note (Signed)
Addended by: Denyse Amass on: 02/05/2018 04:55 PM   Modules accepted: Orders

## 2018-02-18 DIAGNOSIS — H353212 Exudative age-related macular degeneration, right eye, with inactive choroidal neovascularization: Secondary | ICD-10-CM | POA: Diagnosis not present

## 2018-02-21 ENCOUNTER — Encounter: Payer: Self-pay | Admitting: Internal Medicine

## 2018-02-28 ENCOUNTER — Other Ambulatory Visit: Payer: Self-pay | Admitting: Internal Medicine

## 2018-03-02 ENCOUNTER — Encounter: Payer: Self-pay | Admitting: Internal Medicine

## 2018-03-18 ENCOUNTER — Other Ambulatory Visit: Payer: Self-pay

## 2018-03-18 ENCOUNTER — Other Ambulatory Visit: Payer: Medicare Other

## 2018-03-18 ENCOUNTER — Ambulatory Visit: Payer: Medicare Other | Admitting: Neurology

## 2018-03-18 ENCOUNTER — Encounter: Payer: Self-pay | Admitting: Neurology

## 2018-03-18 ENCOUNTER — Encounter

## 2018-03-18 VITALS — BP 130/82 | HR 98 | Ht 60.0 in | Wt 131.0 lb

## 2018-03-18 DIAGNOSIS — R51 Headache: Principal | ICD-10-CM

## 2018-03-18 DIAGNOSIS — R519 Headache, unspecified: Secondary | ICD-10-CM

## 2018-03-18 DIAGNOSIS — R42 Dizziness and giddiness: Secondary | ICD-10-CM | POA: Diagnosis not present

## 2018-03-18 MED ORDER — GABAPENTIN 100 MG PO CAPS: ORAL_CAPSULE | ORAL | 11 refills | Status: DC

## 2018-03-18 NOTE — Patient Instructions (Addendum)
1. Bloodwork for ESR, CRP  Your provider requests that you have LABS drawn today.  We share a lab with [facility] - they are located in [address] (second floor) of this building.  Once you get there, please have a seat and the phlebotomist will call your name.  If you have waited more than 15 minutes, please advise the front desk  2. Start Gabapentin 100mg: take 1 capsule in the evening. Call our office in a month, we may increase dose if needed 3. Start minimizing Tylenol intake to 2-3 times a week, otherwise headaches may worsen 4. Continue aspirin, it is important to control blood pressure, cholesterol, and sugar levels to help prevent further strokes 5. Follow-up in 6 months, call for any changes 

## 2018-03-18 NOTE — Progress Notes (Signed)
NEUROLOGY CONSULTATION NOTE  Shirley Sanders MRN: [id number] DOB: 03/17/1935  Referring provider: Dr. Gildardo Cranker Primary care provider: Dr. Gildardo Cranker  Reason for consult:  Headaches, dizziness  Dear Dr Eulas Post:  Thank you for your kind referral of Shirley Sanders for consultation of the above symptoms. Although her history is well known to you, please allow me to reiterate it for the purpose of our medical record. The patient was accompanied to the clinic by her daughter Maudie Mercury who also provides collateral information. Records and images were personally reviewed where available.  HISTORY OF PRESENT ILLNESS: This is a pleasant 83 year old right-handed woman presenting for evaluation of headaches and dizziness. She has a history of hypertension, hyperlipidemia, 4 strokes, last stroke was in 2015. She was brought to the ER last 12/24/17 for dizziness and was diagnosed with orthostatic hypotension and vertigo. She was initially stumbling to the bathroom when she would get up from bed, tried lying down and the closer she got to the mattress, the worse her symptoms got. She felt like the bed was turning and her head was swimming. She had an MRI brain without contrast which I personally reviewed, no acute changes, there was mild to moderate atrophy, moderate chronic microvascular disease, chronic hemorrhagic lacunar infarct in the right external capsule posteriorly. She reports that meclizine did not help. A friend recommended taking Gingko biloba, she reports that she initially could not stand up, but after taking 2 gingko a day for several days, she had noticed significant improvement. She denied being sick that time, no prior head injury.  She has not had any further dizziness since March. She reports headaches started even before the hospital visit, with pain across her forehead, then becoming localized over the right side. She describes throbbing pain with no associated nausea/vomiting, vision  changes. They occur on a daily basis but are not severe. She can sleep but does not sleep good, usually with only 4 hours of sleep. She has right knee pain and takes Tylenol arthritis 653m BID, which helps for headaches as well. She has been taking it daily for the past 2-3 weeks. She continues to work 6.5 hours daily and functions fine at work. No prior history of migraines. Her daughter and niece have migraines. She reports being diagnosed with chronic fatigue syndrome from "trauma to the brain," every 12:15 in the afternoon and "alien takes over my body" and she has to lie down, like it is pushing her. She denies any diplopia, dysarthria/dysphagia, neck/back pain, bowel/bladder dysfunction. She reports her left side was affected by the stroke, her left arm feels weaker with numbness in her fingers and left leg.   PAST MEDICAL HISTORY: Past Medical History:  Diagnosis Date  . Bronchitis   . CVA (cerebral infarction)    x 4  . Diabetes mellitus without complication ([place]   . GERD (gastroesophageal reflux disease)   . History of hiatal hernia   . Hyperlipidemia   . Hypertension   . Multiple allergies   . Murmur   . Osteoporosis   . Pneumonia   . Renal disorder   . Shingles   . Shingles   . Stroke ([facility]    left sided sensory and motor deficits    PAST SURGICAL HISTORY: Past Surgical History:  Procedure Laterality Date  . ABDOMINAL HYSTERECTOMY    . CATARACT EXTRACTION Bilateral 02/23/2016, 03/22/2016  . CATARACT EXTRACTION W/PHACO Left 03/01/2016   Procedure: CATARACT EXTRACTION PHACO AND INTRAOCULAR LENS PLACEMENT (IOC);  Surgeon: Eulogio Bear, MD;  Location: [facility] ORS;  Service: Ophthalmology;  Laterality: Left;  [place] 1.09 AP% 14.7 CDE 10.13 FLUID BAG LOT # [id number]  . CATARACT EXTRACTION W/PHACO Right 03/22/2016   Procedure: CATARACT EXTRACTION PHACO AND INTRAOCULAR LENS PLACEMENT ([place]);  Surgeon: Eulogio Bear, MD;  Location: [facility] ORS;  Service: Ophthalmology;  Laterality:  Right;  Lot# [id number] H [place]: 01:19.5 AP%: 20.8 CDE: 16.57   . kidney stones    . KNEE SURGERY Right 03/29/2016  . TONSILLECTOMY      MEDICATIONS: Current Outpatient Medications on File Prior to Visit  Medication Sig Dispense Refill  . aspirin EC 81 MG tablet Take 1 tablet (81 mg total) by mouth daily.    . cetirizine (ZYRTEC) 10 MG tablet Take 10 mg by mouth daily.    . chlorthalidone (HYGROTON) 25 MG tablet Take 12.5 mg by mouth every morning.    . Continuous Blood Gluc Receiver (FREESTYLE LIBRE 14 DAY READER) DEVI 1 Device by Does not apply route 3 (three) times daily. Dx: E11.649 1 Device 0  . Continuous Blood Gluc Sensor (FREESTYLE LIBRE 14 DAY SENSOR) MISC 1 Cartridge by Does not apply route every 14 (fourteen) days. Dx: E11.649 3 each 11  . lisinopril (PRINIVIL,ZESTRIL) 2.5 MG tablet TAKE 1 TABLET BY MOUTH ONCE DAILY 90 tablet 1  . Multiple Vitamins-Minerals (PRESERVISION AREDS) CAPS Take 1 capsule by mouth 2 (two) times daily.     . potassium chloride SA (K-DUR,KLOR-CON) 20 MEQ tablet Take 20 mEq by mouth at bedtime.     . simvastatin (ZOCOR) 10 MG tablet TAKE ONE TABLET BY MOUTH AT BEDTIME FOR  ELEVATED  CHOLESTEROL 90 tablet 3   No current facility-administered medications on file prior to visit.     ALLERGIES: Allergies  Allergen Reactions  . Codeine Other (See Comments)    hbp  . Contrast Media [Iodinated Diagnostic Agents]   . Fish Allergy   . Milk-Related Compounds     FAMILY HISTORY: Family History  Problem Relation Age of Onset  . Stroke Mother   . Hypertension Father   . Kidney disease Father   . Diabetes Father   . Cancer Father   . Diabetes Sister   . Cancer Sister        lung  . Stroke Sister   . Heart disease Brother   . Diabetes Brother   . Heart disease Brother   . Stroke Brother   . Diabetes Brother   . Stroke Brother     SOCIAL HISTORY: Social History   Socioeconomic History  . Marital status: Divorced    Spouse name: Not on file  .  Number of children: 2  . Years of education: 11th  . Highest education level: Not on file  Occupational History  . Occupation: Investment banker, operational  . Financial resource strain: Not on file  . Food insecurity:    Worry: Not on file    Inability: Not on file  . Transportation needs:    Medical: Not on file    Non-medical: Not on file  Tobacco Use  . Smoking status: Never Smoker  . Smokeless tobacco: Never Used  Substance and Sexual Activity  . Alcohol use: No  . Drug use: No  . Sexual activity: Never  Lifestyle  . Physical activity:    Days per week: Not on file    Minutes per session: Not on file  . Stress: Not on file  Relationships  . Social  connections:    Talks on phone: Not on file    Gets together: Not on file    Attends religious service: Not on file    Active member of club or organization: Not on file    Attends meetings of clubs or organizations: Not on file    Relationship status: Not on file  . Intimate partner violence:    Fear of current or ex partner: Not on file    Emotionally abused: Not on file    Physically abused: Not on file    Forced sexual activity: Not on file  Other Topics Concern  . Not on file  Social History Narrative   Patient lives at home alone, one stories, no pets   Patient right handed   Patient drinks coffee and diet coke   Past profession- Office    REVIEW OF SYSTEMS: Constitutional: No fevers, chills, or sweats, no generalized fatigue, change in appetite Eyes: No visual changes, double vision, eye pain Ear, nose and throat: No hearing loss, ear pain, nasal congestion, sore throat Cardiovascular: No chest pain, palpitations Respiratory:  No shortness of breath at rest or with exertion, wheezes GastrointestinaI: No nausea, vomiting, diarrhea, abdominal pain, fecal incontinence Genitourinary:  No dysuria, urinary retention or frequency Musculoskeletal:  No neck pain, back pain Integumentary: No rash, pruritus, skin  lesions Neurological: as above Psychiatric: No depression, insomnia, anxiety Endocrine: No palpitations, fatigue, diaphoresis, mood swings, change in appetite, change in weight, increased thirst Hematologic/Lymphatic:  No anemia, purpura, petechiae. Allergic/Immunologic: no itchy/runny eyes, nasal congestion, recent allergic reactions, rashes  PHYSICAL EXAM: Vitals:   03/18/18 1334  BP: 130/82  Pulse: 98  SpO2: 96%   General: No acute distress Head:  Normocephalic/atraumatic, reports pressure on palpation of right temporal region Eyes: Fundoscopic exam shows bilateral sharp discs, no vessel changes, exudates, or hemorrhages Neck: supple, no paraspinal tenderness, full range of motion Back: No paraspinal tenderness Heart: regular rate and rhythm Lungs: Clear to auscultation bilaterally. Vascular: No carotid bruits. Skin/Extremities: No rash, no edema Neurological Exam: Mental status: alert and oriented to person, place, and time, no dysarthria or aphasia, Fund of knowledge is appropriate.  Recent and remote memory are intact.  Attention and concentration are normal.    Able to name objects and repeat phrases. Cranial nerves: CN I: not tested CN II: pupils equal, round and reactive to light, visual fields intact, fundi unremarkable. CN III, IV, VI:  full range of motion, no nystagmus, no ptosis CN V: facial sensation intact CN VII: upper and lower face symmetric CN VIII: hearing intact to finger rub CN IX, X: gag intact, uvula midline CN XI: sternocleidomastoid and trapezius muscles intact CN XII: tongue midline Bulk & Tone: normal, no fasciculations. Motor: 5/5 throughout with no pronator drift, orbits around the left arm (indicating very mild left arm weakness) Sensation: intact to light touch, cold, pin, vibration and joint position sense.  No extinction to double simultaneous stimulation.  Romberg test negative Deep Tendon Reflexes: +2 on right UE and LE, left LE, brisk +3 on  left UE, no ankle clonus Plantar responses: downgoing bilaterally Cerebellar: no incoordination on finger to nose testing Gait: narrow-based and steady, able to tandem walk adequately. Tremor: none  IMPRESSION: This is a pleasant 84 year old right-handed woman with a history of f hypertension, hyperlipidemia, 4 strokes, last stroke was in 2015, presenting for evaluation of headaches and dizziness. She had a bout of dizziness suggestive of vertigo last March, MRI brain no acute changes,  no further dizziness since then. She feels gingko biloba helped. She has had headaches for the past 4 months, possibly tension-type headaches, in this age group would rule out temporal arteritis. Check ESR and CRP. She was advised to start minimizing over the counter pain medication to 2-3 a week, otherwise headaches may worsen. She will start low dose gabapentin 144m qhs for headache prophylaxis, side effects were discussed, this may help with sleep as well. She will call our office for an update in a month, we may uptitrate as tolerated. We discussed secondary stroke prevention, continue aspirin and control of vascular risk factors. Follow-up in 6 months, she knows to call for any changes.   Thank you for allowing me to participate in the care of this patient. Please do not hesitate to call for any questions or concerns.   KEllouise Newer M.D.  CC: Dr. CEulas Post

## 2018-03-19 LAB — SEDIMENTATION RATE: Sed Rate: 11 mm/h (ref 0–30)

## 2018-03-19 LAB — C-REACTIVE PROTEIN: CRP: 2.3 mg/L (ref <8.0)

## 2018-03-24 ENCOUNTER — Telehealth: Payer: Self-pay

## 2018-03-24 NOTE — Telephone Encounter (Signed)
-----   Message from Cameron Sprang, MD sent at 03/21/2018  2:52 PM EDT ----- Pls let her know bloodwork is normal, no evidence of inflammation. Thanks

## 2018-03-24 NOTE — Telephone Encounter (Signed)
LMOM relaying message below.  

## 2018-03-28 ENCOUNTER — Other Ambulatory Visit: Payer: Self-pay | Admitting: Internal Medicine

## 2018-04-22 DIAGNOSIS — H353212 Exudative age-related macular degeneration, right eye, with inactive choroidal neovascularization: Secondary | ICD-10-CM | POA: Diagnosis not present

## 2018-04-29 ENCOUNTER — Other Ambulatory Visit: Payer: Self-pay

## 2018-04-29 DIAGNOSIS — E782 Mixed hyperlipidemia: Secondary | ICD-10-CM

## 2018-04-29 DIAGNOSIS — E11649 Type 2 diabetes mellitus with hypoglycemia without coma: Secondary | ICD-10-CM

## 2018-04-30 ENCOUNTER — Other Ambulatory Visit: Payer: Medicare Other

## 2018-04-30 DIAGNOSIS — E11649 Type 2 diabetes mellitus with hypoglycemia without coma: Secondary | ICD-10-CM | POA: Diagnosis not present

## 2018-04-30 DIAGNOSIS — H353231 Exudative age-related macular degeneration, bilateral, with active choroidal neovascularization: Secondary | ICD-10-CM | POA: Diagnosis not present

## 2018-04-30 DIAGNOSIS — E782 Mixed hyperlipidemia: Secondary | ICD-10-CM | POA: Diagnosis not present

## 2018-05-01 LAB — BASIC METABOLIC PANEL WITH GFR
BUN / CREAT RATIO: 12 (calc) (ref 6–22)
BUN: 12 mg/dL (ref 7–25)
CO2: 30 mmol/L (ref 20–32)
CREATININE: 1 mg/dL — AB (ref 0.60–0.88)
Calcium: 9.3 mg/dL (ref 8.6–10.4)
Chloride: 94 mmol/L — ABNORMAL LOW (ref 98–110)
GFR, EST NON AFRICAN AMERICAN: 53 mL/min/{1.73_m2} — AB (ref >=60)
GFR, Est African American: 61 mL/min/{1.73_m2} (ref >=60)
GLUCOSE: 186 mg/dL — AB (ref 65–99)
Potassium: 3.9 mmol/L (ref 3.5–5.3)
SODIUM: 133 mmol/L — AB (ref 135–146)

## 2018-05-01 LAB — LIPID PANEL
CHOL/HDL RATIO: 2.6 (calc) (ref <5.0)
CHOLESTEROL: 148 mg/dL (ref <200)
HDL: 56 mg/dL (ref >50)
LDL CHOLESTEROL (CALC): 70 mg/dL
Non-HDL Cholesterol (Calc): 92 mg/dL (calc) (ref <130)
Triglycerides: 132 mg/dL (ref <150)

## 2018-05-01 LAB — HEMOGLOBIN A1C
EAG (MMOL/L): 10.1 (calc)
Hgb A1c MFr Bld: 8 % of total Hgb — ABNORMAL HIGH (ref <5.7)
Mean Plasma Glucose: 183 (calc)

## 2018-05-01 LAB — ALT: ALT: 10 U/L (ref 6–29)

## 2018-05-02 ENCOUNTER — Ambulatory Visit: Payer: Medicare Other | Admitting: Internal Medicine

## 2018-05-09 ENCOUNTER — Ambulatory Visit (INDEPENDENT_AMBULATORY_CARE_PROVIDER_SITE_OTHER): Payer: Medicare Other | Admitting: Internal Medicine

## 2018-05-09 ENCOUNTER — Encounter: Payer: Self-pay | Admitting: Internal Medicine

## 2018-05-09 VITALS — BP 132/86 | HR 72 | Temp 98.2°F | Resp 10 | Ht 60.0 in | Wt 125.8 lb

## 2018-05-09 DIAGNOSIS — I6930 Unspecified sequelae of cerebral infarction: Secondary | ICD-10-CM | POA: Diagnosis not present

## 2018-05-09 DIAGNOSIS — I10 Essential (primary) hypertension: Secondary | ICD-10-CM | POA: Diagnosis not present

## 2018-05-09 DIAGNOSIS — E782 Mixed hyperlipidemia: Secondary | ICD-10-CM | POA: Diagnosis not present

## 2018-05-09 DIAGNOSIS — E1121 Type 2 diabetes mellitus with diabetic nephropathy: Secondary | ICD-10-CM

## 2018-05-09 DIAGNOSIS — R5382 Chronic fatigue, unspecified: Secondary | ICD-10-CM

## 2018-05-09 DIAGNOSIS — R634 Abnormal weight loss: Secondary | ICD-10-CM

## 2018-05-09 MED ORDER — GLIPIZIDE ER 2.5 MG PO TB24: 2.5000 mg | ORAL_TABLET | Freq: Every day | ORAL | 3 refills | Status: DC

## 2018-05-09 NOTE — Progress Notes (Signed)
Patient ID: Shirley Sanders, female   DOB: 07/30/1935, 83 y.o.   MRN: [id number]   Location:  [facility] OFFICE  Provider: DR Arletha Grippe  Code Status:  Goals of Care:  Advanced Directives 12/24/2017  Does Patient Have a Medical Advance Directive? No  Would patient like information on creating a medical advance directive? -  Pre-existing out of facility DNR order (yellow form or pink MOST form) -     Chief Complaint  Patient presents with  . Medical Management of Chronic Issues    3 month follow-up on DM, HTN , and vertigo. Patient with weight loss, yet says she is eating at least 2 x daily and snacks. Here with daughter Maudie Mercury   . Audit-C Screening    Negative Audit C screening     HPI: Patient is a 83 y.o. female seen today for medical management of chronic diseases.  Appetite unchanged. She has lost 8 lbs since April 2019. She saw neurology Dr Delice Lesch and was dx with HA. She was rx gabapentin but she never took med. Her HA eventually resolved. Dizziness resolved with gingko biloba. BS at home 160-300s off glipizide. No low BS reactions. No numbness or tingling. She continues to work at least 4 hrs per day.  DM - uncontrolled. BS 160-300s with no low BS reactions off glipizide.  A1c 8%. No numbness or tingling. urine micro/Cr ratio 17. She has hyperlipidemia (LDL 70) and CKD (Cr 1.0)  HTN/hyperlipidemia - stable. she takes chlorthalidone and occasionally has leg cramps. She drinks approx 6 glasses of water per day. She likes Aquafina. LDL 147; HDL 60  Hx CVA - stable on ASA. No new deficits  Osteoporosis - takes vit D3. Last DXA in 2005  CKD - stage 3. Cr 1.0; takes Vit D3 and B12. Followed by nephrology Dr Joelyn Oms.  Macular degeneration - unchanged. followed by eye specialist. Takes eye vitamins and gets prn steroid injections. She OU cats with IOL lenses due to worsening eye pressure. Vision not improved since procedure. She continues to have OS tearing  Chronic fatigue - unchanged.  she was unable to f/u with Integrative specialist due to cost. She continues to get really tired everyday at 12:15   Past Medical History:  Diagnosis Date  . Bronchitis   . CVA (cerebral infarction)    x 4  . Diabetes mellitus without complication ([place])   . GERD (gastroesophageal reflux disease)   . History of hiatal hernia   . Hyperlipidemia   . Hypertension   . Multiple allergies   . Murmur   . Osteoporosis   . Pneumonia   . Renal disorder   . Shingles   . Shingles   . Stroke [facility])    left sided sensory and motor deficits    Past Surgical History:  Procedure Laterality Date  . ABDOMINAL HYSTERECTOMY    . CATARACT EXTRACTION Bilateral 02/23/2016, 03/22/2016  . CATARACT EXTRACTION W/PHACO Left 03/01/2016   Procedure: CATARACT EXTRACTION PHACO AND INTRAOCULAR LENS PLACEMENT (IOC);  Surgeon: Eulogio Bear, MD;  Location: [facility] ORS;  Service: Ophthalmology;  Laterality: Left;  [place] 1.09 AP% 14.7 CDE 10.13 FLUID BAG LOT # [id number]  . CATARACT EXTRACTION W/PHACO Right 03/22/2016   Procedure: CATARACT EXTRACTION PHACO AND INTRAOCULAR LENS PLACEMENT ([place]);  Surgeon: Eulogio Bear, MD;  Location: [facility] ORS;  Service: Ophthalmology;  Laterality: Right;  Lot# [id number] H [place]: 01:19.5 AP%: 20.8 CDE: 16.57   . kidney stones    . KNEE SURGERY Right 03/29/2016  .  TONSILLECTOMY       reports that she has never smoked. She has never used smokeless tobacco. She reports that she does not drink alcohol or use drugs. Social History   Socioeconomic History  . Marital status: Divorced    Spouse name: Not on file  . Number of children: 2  . Years of education: 11th  . Highest education level: Not on file  Occupational History  . Occupation: Investment banker, operational  . Financial resource strain: Not on file  . Food insecurity:    Worry: Not on file    Inability: Not on file  . Transportation needs:    Medical: Not on file    Non-medical: Not on file  Tobacco Use  . Smoking  status: Never Smoker  . Smokeless tobacco: Never Used  Substance and Sexual Activity  . Alcohol use: No  . Drug use: No  . Sexual activity: Never  Lifestyle  . Physical activity:    Days per week: Not on file    Minutes per session: Not on file  . Stress: Not on file  Relationships  . Social connections:    Talks on phone: Not on file    Gets together: Not on file    Attends religious service: Not on file    Active member of club or organization: Not on file    Attends meetings of clubs or organizations: Not on file    Relationship status: Not on file  . Intimate partner violence:    Fear of current or ex partner: Not on file    Emotionally abused: Not on file    Physically abused: Not on file    Forced sexual activity: Not on file  Other Topics Concern  . Not on file  Social History Narrative   Patient lives at home alone, one stories, no pets   Patient right handed   Patient drinks coffee and diet coke   Past profession- Office    Family History  Problem Relation Age of Onset  . Stroke Mother   . Hypertension Father   . Kidney disease Father   . Diabetes Father   . Cancer Father   . Diabetes Sister   . Cancer Sister        lung  . Stroke Sister   . Heart disease Brother   . Diabetes Brother   . Heart disease Brother   . Stroke Brother   . Diabetes Brother   . Stroke Brother     Allergies  Allergen Reactions  . Codeine Other (See Comments)    hbp  . Contrast Media [Iodinated Diagnostic Agents]   . Fish Allergy   . Milk-Related Compounds     Outpatient Encounter Medications as of 05/09/2018  Medication Sig  . aspirin EC 81 MG tablet Take 1 tablet (81 mg total) by mouth daily.  . cetirizine (ZYRTEC) 10 MG tablet Take 10 mg by mouth daily.  . chlorthalidone (HYGROTON) 25 MG tablet Take 12.5 mg by mouth every morning.  . Continuous Blood Gluc Receiver (FREESTYLE LIBRE 14 DAY READER) DEVI 1 Device by Does not apply route 3 (three) times daily. Dx: E11.649    . Continuous Blood Gluc Sensor (FREESTYLE LIBRE 14 DAY SENSOR) MISC 1 Cartridge by Does not apply route every 14 (fourteen) days. Dx: E11.649  . lisinopril (PRINIVIL,ZESTRIL) 2.5 MG tablet TAKE 1 TABLET BY MOUTH ONCE DAILY  . Multiple Vitamins-Minerals (PRESERVISION AREDS) CAPS Take 1 capsule by mouth 2 (two)  times daily.   . potassium chloride SA (K-DUR,KLOR-CON) 20 MEQ tablet Take 20 mEq by mouth at bedtime.   . simvastatin (ZOCOR) 10 MG tablet TAKE ONE TABLET BY MOUTH AT BEDTIME FOR  ELEVATED  CHOLESTEROL  . [DISCONTINUED] gabapentin (NEURONTIN) 100 MG capsule Take 1 capsule every evening   No facility-administered encounter medications on file as of 05/09/2018.     Review of Systems:  Review of Systems  Constitutional: Positive for appetite change (loss, chronic), fatigue and unexpected weight change (down 8 lbs).  Eyes: Positive for visual disturbance.  All other systems reviewed and are negative.   Health Maintenance  Topic Date Due  . INFLUENZA VACCINE  05/08/2018  . TETANUS/TDAP  10/08/2018 (Originally 03/30/1954)  . OPHTHALMOLOGY EXAM  07/08/2018  . FOOT EXAM  08/21/2018  . HEMOGLOBIN A1C  10/31/2018  . DEXA SCAN  Completed  . PNA vac Low Risk Adult  Completed    Physical Exam: Vitals:   05/09/18 0911  BP: 132/86  Pulse: 72  Resp: 10  Temp: 98.2 F (36.8 C)  TempSrc: Oral  SpO2: 96%  Weight: 125 lb 12.8 oz (57.1 kg)  Height: 5' (1.524 m)   Body mass index is 24.57 kg/m. Physical Exam  Constitutional: She is oriented to person, place, and time. She appears well-developed and well-nourished.  HENT:  Mouth/Throat: Oropharynx is clear and moist. No oropharyngeal exudate.  MMM; no oral thrush  Eyes: Pupils are equal, round, and reactive to light. No scleral icterus.  Neck: Neck supple. Carotid bruit is not present. No tracheal deviation present. No thyromegaly present.  Cardiovascular: Normal rate, regular rhythm and intact distal pulses. Exam reveals no gallop  and no friction rub.  Murmur (1/6 SEM) heard. No LE edema b/l. no calf TTP.   Pulmonary/Chest: Effort normal and breath sounds normal. No stridor. No respiratory distress. She has no wheezes. She has no rales.  Abdominal: Soft. Normal appearance and bowel sounds are normal. She exhibits no distension and no mass. There is no hepatomegaly. There is no tenderness. There is no rigidity, no rebound and no guarding. No hernia.  Musculoskeletal: She exhibits edema.       Right foot: There is deformity (bunion).  Lymphadenopathy:    She has no cervical adenopathy.  Neurological: She is alert and oriented to person, place, and time. She has normal reflexes.  Skin: Skin is warm and dry. No rash noted.  Psychiatric: She has a normal mood and affect. Her behavior is normal. Judgment and thought content normal.   Diabetic Foot Exam - Simple   Simple Foot Form Diabetic Foot exam was performed with the following findings:  Yes 05/09/2018 10:42 AM  Visual Inspection See comments:  Yes Sensation Testing Intact to touch and monofilament testing bilaterally:  Yes Pulse Check Posterior Tibialis and Dorsalis pulse intact bilaterally:  Yes Comments Right bunion present; no ulcerations/calluses      Labs reviewed: Basic Metabolic Panel: Recent Labs    12/24/17 0657 12/24/17 0712 01/29/18 0806 04/30/18 0807  NA 139 141 139 133*  K 3.5 3.5 4.4 3.9  CL 103 101 103 94*  CO2 25  --  28 30  GLUCOSE 198* 193* 137* 186*  BUN 19 21* 19 12  CREATININE 1.21* 1.10* 1.33* 1.00*  CALCIUM 9.4  --  9.4 9.3   Liver Function Tests: Recent Labs    08/16/17 0809 12/24/17 0657 01/29/18 0806 04/30/18 0807  AST 16 22  --   --   ALT 10  13* 11 10  ALKPHOS  --  86  --   --   BILITOT 0.5 0.6  --   --   PROT 6.7 7.4  --   --   ALBUMIN  --  4.1  --   --    No results for input(s): LIPASE, AMYLASE in the last 8760 hours. No results for input(s): AMMONIA in the last 8760 hours. CBC: Recent Labs     12/24/17 0657 12/24/17 0712  WBC 6.9  --   NEUTROABS 5.8  --   HGB 13.8 15.0  HCT 42.6 44.0  MCV 91.4  --   PLT 251  --    Lipid Panel: Recent Labs    08/16/17 0809 01/29/18 0806 04/30/18 0807  CHOL 163 230* 148  HDL 58 60 56  LDLCALC 85 147* 70  TRIG 108 113 132  CHOLHDL 2.8 3.8 2.6   Lab Results  Component Value Date   HGBA1C 8.0 (H) 04/30/2018    Procedures since last visit: No results found.  Assessment/Plan   ICD-10-CM   1. Type 2 diabetes mellitus with diabetic nephropathy, without long-term current use of insulin (HCC) E11.21 glipiZIDE (GLUCOTROL XL) 2.5 MG 24 hr tablet    CMP with eGFR([facility])    Microalbumin/Creatinine Ratio, Urine  2. Mixed hyperlipidemia E78.2 Lipid Panel    TSH  3. Essential hypertension I10   4. History of CVA with residual deficit I69.30   5. Chronic fatigue R53.82   6. Weight loss R63.4 TSH   unintentional   RESTART GLIPIZIDE ER AT 2.5MG DAILY FOR DIABETES  Continue other medications as ordered  Follow up with specialists as scheduled  Follow up in 3 mos with Shirley Sanders. Fasting labs prior to appt     Shirley Sanders  [facility] [address] [place], [place] [address] [phone] Cell (Monday-Friday 8 AM - 5 PM) [phone] After 5 PM and follow prompts

## 2018-05-09 NOTE — Patient Instructions (Addendum)
RESTART GLIPIZIDE ER AT 2.5MG  DAILY FOR DIABETES  Continue other medications as ordered  Follow up with specialists as scheduled  Follow up in 3 mos with Janett Billow. Fasting labs prior to appt

## 2018-05-28 ENCOUNTER — Encounter: Payer: Self-pay | Admitting: Internal Medicine

## 2018-06-01 ENCOUNTER — Encounter: Payer: Self-pay | Admitting: Internal Medicine

## 2018-06-03 ENCOUNTER — Other Ambulatory Visit: Payer: Self-pay

## 2018-06-03 DIAGNOSIS — E1121 Type 2 diabetes mellitus with diabetic nephropathy: Secondary | ICD-10-CM

## 2018-06-03 DIAGNOSIS — R634 Abnormal weight loss: Secondary | ICD-10-CM

## 2018-06-03 DIAGNOSIS — E782 Mixed hyperlipidemia: Secondary | ICD-10-CM

## 2018-06-10 DIAGNOSIS — H353223 Exudative age-related macular degeneration, left eye, with inactive scar: Secondary | ICD-10-CM | POA: Diagnosis not present

## 2018-06-10 DIAGNOSIS — H353212 Exudative age-related macular degeneration, right eye, with inactive choroidal neovascularization: Secondary | ICD-10-CM | POA: Diagnosis not present

## 2018-06-12 ENCOUNTER — Telehealth: Payer: Self-pay | Admitting: *Deleted

## 2018-06-12 NOTE — Telephone Encounter (Signed)
Received Handicap Placard with self addressed envelope. Placed with last OV note in Dr. Vale Haven folder to review and sign.

## 2018-07-01 DIAGNOSIS — E119 Type 2 diabetes mellitus without complications: Secondary | ICD-10-CM | POA: Diagnosis not present

## 2018-07-11 ENCOUNTER — Telehealth: Payer: Self-pay

## 2018-07-11 NOTE — Telephone Encounter (Signed)
Called patient to try to schedule AWV in the office. No answer-left voicemail to call back.    

## 2018-07-11 NOTE — Telephone Encounter (Signed)
Please schedule for 07/16/18 before 1pm

## 2018-07-29 DIAGNOSIS — H353212 Exudative age-related macular degeneration, right eye, with inactive choroidal neovascularization: Secondary | ICD-10-CM | POA: Diagnosis not present

## 2018-07-30 ENCOUNTER — Telehealth: Payer: Self-pay | Admitting: Internal Medicine

## 2018-07-30 NOTE — Telephone Encounter (Signed)
Left msg w/ pt's dtr, Kim asking her to confirm AWV-S appt on 09/03/18 before seeing Jessica. VDM (DD)

## 2018-08-08 ENCOUNTER — Other Ambulatory Visit: Payer: Medicare Other

## 2018-08-08 DIAGNOSIS — Z8673 Personal history of transient ischemic attack (TIA), and cerebral infarction without residual deficits: Secondary | ICD-10-CM | POA: Diagnosis not present

## 2018-08-08 DIAGNOSIS — E7800 Pure hypercholesterolemia, unspecified: Secondary | ICD-10-CM | POA: Diagnosis not present

## 2018-08-08 DIAGNOSIS — E1149 Type 2 diabetes mellitus with other diabetic neurological complication: Secondary | ICD-10-CM | POA: Diagnosis not present

## 2018-08-08 DIAGNOSIS — Z6823 Body mass index (BMI) 23.0-23.9, adult: Secondary | ICD-10-CM | POA: Diagnosis not present

## 2018-08-08 DIAGNOSIS — N183 Chronic kidney disease, stage 3 (moderate): Secondary | ICD-10-CM | POA: Diagnosis not present

## 2018-08-08 DIAGNOSIS — I10 Essential (primary) hypertension: Secondary | ICD-10-CM | POA: Diagnosis not present

## 2018-09-03 ENCOUNTER — Ambulatory Visit: Payer: Medicare Other | Admitting: Nurse Practitioner

## 2018-09-03 ENCOUNTER — Ambulatory Visit: Payer: Medicare Other

## 2018-09-03 DIAGNOSIS — S335XXA Sprain of ligaments of lumbar spine, initial encounter: Secondary | ICD-10-CM | POA: Diagnosis not present

## 2018-09-05 DIAGNOSIS — N184 Chronic kidney disease, stage 4 (severe): Secondary | ICD-10-CM | POA: Diagnosis not present

## 2018-09-05 DIAGNOSIS — I129 Hypertensive chronic kidney disease with stage 1 through stage 4 chronic kidney disease, or unspecified chronic kidney disease: Secondary | ICD-10-CM | POA: Diagnosis not present

## 2018-09-05 DIAGNOSIS — E1122 Type 2 diabetes mellitus with diabetic chronic kidney disease: Secondary | ICD-10-CM | POA: Diagnosis not present

## 2018-09-05 DIAGNOSIS — M546 Pain in thoracic spine: Secondary | ICD-10-CM | POA: Diagnosis not present

## 2018-09-05 DIAGNOSIS — S22081A Stable burst fracture of T11-T12 vertebra, initial encounter for closed fracture: Secondary | ICD-10-CM | POA: Diagnosis not present

## 2018-09-05 DIAGNOSIS — Z79899 Other long term (current) drug therapy: Secondary | ICD-10-CM | POA: Diagnosis not present

## 2018-09-05 DIAGNOSIS — M549 Dorsalgia, unspecified: Secondary | ICD-10-CM | POA: Diagnosis not present

## 2018-09-05 DIAGNOSIS — M545 Low back pain: Secondary | ICD-10-CM | POA: Diagnosis not present

## 2018-09-05 DIAGNOSIS — Z8673 Personal history of transient ischemic attack (TIA), and cerebral infarction without residual deficits: Secondary | ICD-10-CM | POA: Diagnosis not present

## 2018-09-05 DIAGNOSIS — S22000A Wedge compression fracture of unspecified thoracic vertebra, initial encounter for closed fracture: Secondary | ICD-10-CM | POA: Diagnosis not present

## 2018-09-05 DIAGNOSIS — Z7982 Long term (current) use of aspirin: Secondary | ICD-10-CM | POA: Diagnosis not present

## 2018-09-10 DIAGNOSIS — Z139 Encounter for screening, unspecified: Secondary | ICD-10-CM | POA: Diagnosis not present

## 2018-09-10 DIAGNOSIS — S22080A Wedge compression fracture of T11-T12 vertebra, initial encounter for closed fracture: Secondary | ICD-10-CM | POA: Diagnosis not present

## 2018-09-10 DIAGNOSIS — Z6823 Body mass index (BMI) 23.0-23.9, adult: Secondary | ICD-10-CM | POA: Diagnosis not present

## 2018-09-17 DIAGNOSIS — M4854XA Collapsed vertebra, not elsewhere classified, thoracic region, initial encounter for fracture: Secondary | ICD-10-CM | POA: Diagnosis not present

## 2018-09-17 DIAGNOSIS — S22000A Wedge compression fracture of unspecified thoracic vertebra, initial encounter for closed fracture: Secondary | ICD-10-CM | POA: Insufficient documentation

## 2018-09-23 DIAGNOSIS — H353212 Exudative age-related macular degeneration, right eye, with inactive choroidal neovascularization: Secondary | ICD-10-CM | POA: Diagnosis not present

## 2018-10-02 DIAGNOSIS — S22009A Unspecified fracture of unspecified thoracic vertebra, initial encounter for closed fracture: Secondary | ICD-10-CM | POA: Diagnosis not present

## 2018-10-02 DIAGNOSIS — M5136 Other intervertebral disc degeneration, lumbar region: Secondary | ICD-10-CM | POA: Diagnosis not present

## 2018-10-06 DIAGNOSIS — M5136 Other intervertebral disc degeneration, lumbar region: Secondary | ICD-10-CM | POA: Diagnosis not present

## 2018-10-09 DIAGNOSIS — M48061 Spinal stenosis, lumbar region without neurogenic claudication: Secondary | ICD-10-CM | POA: Diagnosis not present

## 2018-10-09 DIAGNOSIS — S22009A Unspecified fracture of unspecified thoracic vertebra, initial encounter for closed fracture: Secondary | ICD-10-CM | POA: Diagnosis not present

## 2018-10-13 DIAGNOSIS — M5136 Other intervertebral disc degeneration, lumbar region: Secondary | ICD-10-CM | POA: Diagnosis not present

## 2018-10-13 DIAGNOSIS — S22088A Other fracture of T11-T12 vertebra, initial encounter for closed fracture: Secondary | ICD-10-CM | POA: Diagnosis not present

## 2018-10-13 DIAGNOSIS — M898X8 Other specified disorders of bone, other site: Secondary | ICD-10-CM | POA: Diagnosis not present

## 2018-10-13 DIAGNOSIS — S22080A Wedge compression fracture of T11-T12 vertebra, initial encounter for closed fracture: Secondary | ICD-10-CM | POA: Diagnosis not present

## 2018-10-13 DIAGNOSIS — Z7984 Long term (current) use of oral hypoglycemic drugs: Secondary | ICD-10-CM | POA: Diagnosis not present

## 2018-10-13 DIAGNOSIS — I69354 Hemiplegia and hemiparesis following cerebral infarction affecting left non-dominant side: Secondary | ICD-10-CM | POA: Diagnosis not present

## 2018-10-13 DIAGNOSIS — Z79899 Other long term (current) drug therapy: Secondary | ICD-10-CM | POA: Diagnosis not present

## 2018-10-13 DIAGNOSIS — M8788 Other osteonecrosis, other site: Secondary | ICD-10-CM | POA: Diagnosis not present

## 2018-10-13 DIAGNOSIS — I10 Essential (primary) hypertension: Secondary | ICD-10-CM | POA: Diagnosis not present

## 2018-10-13 DIAGNOSIS — Z885 Allergy status to narcotic agent status: Secondary | ICD-10-CM | POA: Diagnosis not present

## 2018-10-13 DIAGNOSIS — E119 Type 2 diabetes mellitus without complications: Secondary | ICD-10-CM | POA: Diagnosis not present

## 2018-10-13 DIAGNOSIS — S22089A Unspecified fracture of T11-T12 vertebra, initial encounter for closed fracture: Secondary | ICD-10-CM | POA: Diagnosis not present

## 2018-10-29 ENCOUNTER — Ambulatory Visit: Payer: Medicare Other | Admitting: Neurology

## 2018-11-18 DIAGNOSIS — H353212 Exudative age-related macular degeneration, right eye, with inactive choroidal neovascularization: Secondary | ICD-10-CM | POA: Diagnosis not present

## 2018-11-20 DIAGNOSIS — Z6821 Body mass index (BMI) 21.0-21.9, adult: Secondary | ICD-10-CM | POA: Diagnosis not present

## 2018-11-20 DIAGNOSIS — M4854XA Collapsed vertebra, not elsewhere classified, thoracic region, initial encounter for fracture: Secondary | ICD-10-CM | POA: Diagnosis not present

## 2018-11-20 DIAGNOSIS — E11649 Type 2 diabetes mellitus with hypoglycemia without coma: Secondary | ICD-10-CM | POA: Diagnosis not present

## 2019-03-17 ENCOUNTER — Other Ambulatory Visit: Payer: Self-pay | Admitting: *Deleted

## 2019-03-17 MED ORDER — FREESTYLE LIBRE 14 DAY SENSOR MISC: 1.0000 | 11 refills | Status: DC

## 2019-03-17 NOTE — Telephone Encounter (Signed)
[employer]

## 2019-04-08 ENCOUNTER — Encounter: Payer: Self-pay | Admitting: Family Medicine

## 2019-04-08 ENCOUNTER — Other Ambulatory Visit: Payer: Self-pay

## 2019-04-08 ENCOUNTER — Ambulatory Visit (INDEPENDENT_AMBULATORY_CARE_PROVIDER_SITE_OTHER): Payer: Medicare Other | Admitting: Family Medicine

## 2019-04-08 VITALS — BP 147/86 | HR 84 | Temp 98.4°F | Ht 60.0 in | Wt 119.4 lb

## 2019-04-08 DIAGNOSIS — E1121 Type 2 diabetes mellitus with diabetic nephropathy: Secondary | ICD-10-CM

## 2019-04-08 DIAGNOSIS — E1159 Type 2 diabetes mellitus with other circulatory complications: Secondary | ICD-10-CM

## 2019-04-08 DIAGNOSIS — Z91199 Patient's noncompliance with other medical treatment and regimen due to unspecified reason: Secondary | ICD-10-CM | POA: Insufficient documentation

## 2019-04-08 DIAGNOSIS — Z7689 Persons encountering health services in other specified circumstances: Secondary | ICD-10-CM | POA: Insufficient documentation

## 2019-04-08 DIAGNOSIS — E1169 Type 2 diabetes mellitus with other specified complication: Secondary | ICD-10-CM | POA: Insufficient documentation

## 2019-04-08 DIAGNOSIS — I10 Essential (primary) hypertension: Secondary | ICD-10-CM

## 2019-04-08 DIAGNOSIS — E785 Hyperlipidemia, unspecified: Secondary | ICD-10-CM

## 2019-04-08 DIAGNOSIS — I639 Cerebral infarction, unspecified: Secondary | ICD-10-CM | POA: Insufficient documentation

## 2019-04-08 DIAGNOSIS — E0822 Diabetes mellitus due to underlying condition with diabetic chronic kidney disease: Secondary | ICD-10-CM | POA: Insufficient documentation

## 2019-04-08 DIAGNOSIS — H3530 Unspecified macular degeneration: Secondary | ICD-10-CM

## 2019-04-08 DIAGNOSIS — Z9119 Patient's noncompliance with other medical treatment and regimen: Secondary | ICD-10-CM | POA: Insufficient documentation

## 2019-04-08 DIAGNOSIS — Z823 Family history of stroke: Secondary | ICD-10-CM

## 2019-04-08 DIAGNOSIS — N184 Chronic kidney disease, stage 4 (severe): Secondary | ICD-10-CM | POA: Diagnosis not present

## 2019-04-08 DIAGNOSIS — H548 Legal blindness, as defined in USA: Secondary | ICD-10-CM | POA: Insufficient documentation

## 2019-04-08 LAB — POCT GLYCOSYLATED HEMOGLOBIN (HGB A1C): Hemoglobin A1C: 7.1 % — AB (ref 4.0–5.6)

## 2019-04-08 MED ORDER — FREESTYLE LIBRE 14 DAY SENSOR MISC: 1.0000 | 11 refills | Status: AC

## 2019-04-08 MED ORDER — FREESTYLE LIBRE 14 DAY SENSOR MISC: 1.0000 | 11 refills | Status: DC

## 2019-04-08 NOTE — Patient Instructions (Signed)

## 2019-04-08 NOTE — Progress Notes (Signed)
New patient office visit note:  Impression and Recommendations:    1. Establishing care with new doctor, encounter for   2. Type 2 diabetes mellitus with other specified complication, unspecified whether long term insulin use ([place])   3. Hypertension associated with diabetes ([place])   4. Hyperlipidemia associated with type 2 diabetes mellitus ([place])   5. Chronic kidney disease (CKD) stage G4/A1, severely decreased glomerular filtration rate (GFR) between 15-29 mL/min/1.73 square meter and albuminuria creatinine ratio less than 30 mg/g (HCC)   6. Type 2 diabetes mellitus with diabetic nephropathy, unspecified whether long term insulin use ([place])   7. Cerebrovascular accident (CVA), unspecified mechanism ([place])   8. Macular degeneration (senile) of retina/  legally blind   9. Legally blind   10. Family history of stroke or transient ischemic attack in mother died age 58; several other fam members also has CVAs     Establishing care with new doctor, encounter for   - Plan: extensively educated about our role as her PCP and need for keeping specialists    Type 2 diabetes mellitus with other specified complication, unspecified whether long term insulin use ([place]) - metformin C/I due to poor GFR/ serum Crt   - Plan: check A1c, bring in BS log, educated extensively  - Counseled patient on pathophysiology of disease and discussed various treatment options, which often includes dietary and lifestyle modifications as first line.  Importance of low carb/ketogenic diet discussed with patient in addition to regular exercise.   - Check FBS and 2 hours after the biggest meal of your day.  Keep log and bring in next OV for my review.   Also, if you ever feel poorly, please check your blood pressure and blood sugar, as one or the other could be the cause of your symptoms.  - Being a diabetic, you need yearly eye and foot exams. She goes yrly to optho   - Continue current medication(s).     Also,  risks and benefits of medications discussed with patient, including alternative treatments.   Encouraged patient to read drug information handouts to further educate self about the medicine prior to starting it. Contact us prior with any questions/ concerns.   Hypertension associated with diabetes ([place])   - Plan: slightly up today but ok for pt's age.    - Lifestyle changes such as dash diet and engaging in a regular exercise program discussed with patient.  Educational handouts provided  Ambulatory BP monitoring encouraged. Keep log and bring in next OV  Continue current medication(s).   Also, risks and benefits of medications discussed with patient, including alternative treatments.   Encouraged patient to read drug information handouts to further educate self about the medicine prior to starting it.   Contact us prior with any Q's/ concerns.   Hyperlipidemia associated with type 2 diabetes mellitus ([place])   - Plan: check FLP, CMP, Cont statin, heart healthy diet d/c pt   Chronic kidney disease (CKD) stage G4/A1, severely decreased glomerular filtration rate (GFR) between 15-29 mL/min/1.73 square meter and albuminuria creatinine ratio less than 30 mg/g (HCC) - Sees Dr. Joelyn Oms at [facility] her antihypertensives-Kidney disease due to diabetes, hypertension and atherosclerotic disease-no bx   - Plan: f/up [facility] at least yrly or as often as they think needed   Type 2 diabetes mellitus with diabetic nephropathy, unspecified whether long term insulin use ([place])   - Plan: POCT glycosylated hemoglobin (Hb A1C), check levels/ labs near future -  Continuous Blood Gluc Sensor (FREESTYLE LIBRE 14 DAY SENSOR) MISC   Cerebrovascular accident (CVA), unspecified mechanism ([place])   - several in past - Plan: CV risk mediation- ASA, statin, BS/BP control, heart healthy diet, exercise---> all d/c pt importance of these aspects of her care   Macular degeneration (senile) of retina/   legally blind - Plan: f/up with Optho   Legally blind    Family history of stroke or transient ischemic attack in mother died age 22; several other fam members also has CVAs     Education and routine counseling performed. Handouts provided.   Orders Placed This Encounter  Procedures  . POCT glycosylated hemoglobin (Hb A1C)    Meds ordered this encounter  Medications  . DISCONTD: Continuous Blood Gluc Sensor (FREESTYLE LIBRE 14 DAY SENSOR) MISC    Sig: 1 Cartridge by Does not apply route every 14 (fourteen) days. Dx: E11.649    Dispense:  3 each    Refill:  11  . Continuous Blood Gluc Sensor (FREESTYLE LIBRE 14 DAY SENSOR) MISC    Sig: 1 Cartridge by Does not apply route every 14 (fourteen) days. Dx: E11.649    Dispense:  3 each    Refill:  11    Medications Discontinued During This Encounter  Medication Reason  . aspirin EC 81 MG tablet   . cetirizine (ZYRTEC) 10 MG tablet Change in therapy  . chlorthalidone (HYGROTON) 25 MG tablet Change in therapy  . glipiZIDE (GLUCOTROL XL) 2.5 MG 24 hr tablet Discontinued by provider  . Continuous Blood Gluc Sensor (FREESTYLE LIBRE 14 DAY SENSOR) MISC Reorder  . Continuous Blood Gluc Sensor (FREESTYLE LIBRE 14 DAY SENSOR) MISC      Pt was interviewed and evaluated by me in the clinic today for 42.5+ minutes, with over 50% time spent in face to face counseling of patients various medical conditions, treatment plans of those medical conditions including medicine management and lifestyle modification, strategies to improve health and well being; and in coordination of care. SEE ABOVE TREATMENT PLAN FOR DETAILS Gross side effects, risk and benefits, and alternatives of medications discussed with patient.  Patient is aware that all medications have potential side effects and we are unable to predict every side effect or drug-drug interaction that may occur.  Expresses verbal understanding and consents to current therapy plan and treatment  regimen.  Return for Near future full set FBW then 2)  OV to discuss labs and discuss her MMP-doxy.  Please see AVS handed out to patient at the end of our visit for further patient instructions/ counseling done pertaining to today's office visit.    Note:  This document was prepared using Dragon voice recognition software and may include unintentional dictation errors.  Diego Cory Maleea Camilo DO 04/08/2019 11:40 AM   ---------------------------------------------------------------------------------------------------------------------------------------------------------------------------------------------    Subjective:    Chief complaint:   Chief Complaint  Patient presents with  . Establish Care  -No acute concerns   HPI: Shirley Sanders is a pleasant 84 y.o. female who presents to [place] at [facility] today to review their medical history with me and establish care.   I asked the patient to review their chronic problem list with me to ensure everything was updated and accurate.    All recent office visits with other providers, any medical records that patient brought in etc  - I reviewed today.     We asked pt to get [place] their medical records from [facility] providers/ specialists that they  had seen within the past 3-5 years- if they are in private practice and/or do not work for [employer], [facility], [place], [place] or [employer] owned practice.  Told them to call their specialists to clarify this if they are not sure.    Patient was at [facility] in [place] Dr Eulas Post for many yrs- whom the loved.   She left practice b/c "too much work in primary care for her"- went into "hospital care"  Just retired this past April-->  Was in [employer] for 20+ yrs Lives alone- very independent - doesn't drive due to severe macular degeneration.  -She is LEGALLY BLIND  Was seen at [place] - then Dr Lisbeth Ply left.   - She doesn't like doctors leaving  practicies and is coming here to become established.    Pt likes to be seen q 48mo she likes for her A1c to be under 6.5   DM: for many yrs now-   Has freestyle libre tired of having her finger pricked.  They pay cash.   BS- recently have been running poorly-past year to year and a half and she has been having lows.- prior provider had her on glipizide only.   -  Several months ago she was switched to Actos and it has improved.   She likes to keep it under 6.5    Recetnly been much hgier- over 8.0 one time.   Pt was on metformin--> but due to h/o Stage 4 CKD, was d/ced   CKD: Stage IV in recent past.  Upon review of chart showed that last note from Dr Joelyn Oms[facility] was in February 2019.  I asked the family to please get me updated once..   HE gives pt her Amlodipine, Chlorothalidone, lisinopril, and controls her BP.   Metformin C/I    + ASCVD:  1st stroke 2003 at [facility]; last stroke in 2015- saw Dr Erlinda Hong in 2015- they did not want to f/up with him.   No acute concerns today otherwise   Wt Readings from Last 3 Encounters:  05/12/19 117 lb (53.1 kg)  04/08/19 119 lb 6.4 oz (54.2 kg)  05/09/18 125 lb 12.8 oz (57.1 kg)   BP Readings from Last 3 Encounters:  05/12/19 118/73  04/08/19 (!) 147/86  05/09/18 132/86   Pulse Readings from Last 3 Encounters:  04/08/19 84  05/09/18 72  03/18/18 98   BMI Readings from Last 3 Encounters:  05/12/19 22.85 kg/m  04/08/19 23.32 kg/m  05/09/18 24.57 kg/m    Patient Care Team    Relationship Specialty Notifications Start End  Mellody Dance, DO PCP - General Family Medicine  04/08/19   Milus Height, MD Referring Physician Ophthalmology  10/21/14   Rexene Agent, MD Attending Physician Nephrology  10/21/14   Gildardo Cranker, [place] Physician Internal Medicine  11/19/14   Eulogio Bear, MD Consulting Physician Ophthalmology  03/20/17   Garrel Ridgel, [place] Consulting Physician Podiatry  04/08/19   [facility]     04/08/19    Comment: Dr Joelyn Oms- for CKD  Ortho, [facility]    04/08/19    Comment: dr Delorise Jackson or something     Patient Active Problem List   Diagnosis Date Noted  . Hyperlipidemia associated with type 2 diabetes mellitus ([place]) 04/08/2019    Priority: High  . Hypertension associated with diabetes ([place]) 03/17/2014    Priority: High  . Diabetes mellitus, type 2 ([place]) 03/17/2014    Priority:  High  . Diabetes mellitus with diabetic nephropathy ([place]) 04/06/2015    Priority: Medium  . Chronic kidney disease (CKD) stage G4/A1, severely decreased glomerular filtration rate (GFR) between 15-29 mL/min/1.73 square meter and albuminuria creatinine ratio less than 30 mg/g (HCC) 10/22/2014    Priority: Medium  . H/O noncompliance with medical treatment, presenting hazards to health 04/08/2019    Priority: Low  . Establishing care with new doctor, encounter for 04/08/2019  . Diabetes mellitus due to underlying condition with stage 3 chronic kidney disease, without long-term current use of insulin ([place]) 04/08/2019  . Cerebrovascular accident (CVA) ([place]) 04/08/2019  . Macular degeneration (senile) of retina/  legally blind 04/08/2019  . Family history of stroke or transient ischemic attack in mother died age 57; several other fam members also has CVAs 04/08/2019  . Legally blind 04/08/2019  . Compression fracture of thoracic vertebra (HCC) 09/17/2018  . Hypoglycemia due to type 2 diabetes mellitus ([place]) 01/31/2018  . Chronic fatigue 01/31/2018  . Vertigo 01/31/2018  . Osteoporosis 07/08/2015  . History of CVA with residual deficit 10/22/2014  . Macular degeneration of both eyes 10/22/2014  . Anemia in chronic kidney disease 10/22/2014  . Hyperlipidemia 03/17/2014       As reported by pt:  Past Medical History:  Diagnosis Date  . Bronchitis   . CVA (cerebral infarction)    x 4  . Diabetes mellitus without complication ([place])   . GERD (gastroesophageal reflux disease)   . History of hiatal  hernia   . Hyperlipidemia   . Hypertension   . Multiple allergies   . Murmur   . Osteoporosis   . Pneumonia   . Renal disorder   . Shingles   . Shingles   . Stroke [facility])    left sided sensory and motor deficits     Past Surgical History:  Procedure Laterality Date  . ABDOMINAL HYSTERECTOMY    . CATARACT EXTRACTION Bilateral 02/23/2016, 03/22/2016  . CATARACT EXTRACTION W/PHACO Left 03/01/2016   Procedure: CATARACT EXTRACTION PHACO AND INTRAOCULAR LENS PLACEMENT (IOC);  Surgeon: Eulogio Bear, MD;  Location: [facility] ORS;  Service: Ophthalmology;  Laterality: Left;  [place] 1.09 AP% 14.7 CDE 10.13 FLUID BAG LOT # [id number]  . CATARACT EXTRACTION W/PHACO Right 03/22/2016   Procedure: CATARACT EXTRACTION PHACO AND INTRAOCULAR LENS PLACEMENT ([place]);  Surgeon: Eulogio Bear, MD;  Location: [facility] ORS;  Service: Ophthalmology;  Laterality: Right;  Lot# [id number] H [place]: 01:19.5 AP%: 20.8 CDE: 16.57   . kidney stones    . KNEE SURGERY Right 03/29/2016  . TONSILLECTOMY       Family History  Problem Relation Age of Onset  . Stroke Mother   . Hypertension Father   . Kidney disease Father   . Diabetes Father   . Cancer Father   . Diabetes Sister   . Cancer Sister        lung  . Stroke Sister   . Heart disease Brother   . Diabetes Brother   . Heart disease Brother   . Stroke Brother   . Diabetes Brother   . Stroke Brother      Social History   Substance and Sexual Activity  Drug Use No     Social History   Substance and Sexual Activity  Alcohol Use No     Social History   Tobacco Use  Smoking Status Never Smoker  Smokeless Tobacco Never Used     Current Meds  Medication Sig  . aspirin EC 81  MG tablet Take 1 tablet by mouth daily.  . chlorpheniramine ([employer] ALLERGY RELIEF) 4 MG tablet Take 4 mg by mouth 2 (two) times daily as needed for allergies.  . chlorthalidone (HYGROTON) 25 MG tablet Take 12.5 mg by mouth daily. Filled by Nephro Doc  . Continuous Blood Gluc  Receiver (FREESTYLE LIBRE 14 DAY READER) DEVI 1 Device by Does not apply route 3 (three) times daily. Dx: E11.649  . Continuous Blood Gluc Sensor (FREESTYLE LIBRE 14 DAY SENSOR) MISC 1 Cartridge by Does not apply route every 14 (fourteen) days. Dx: E11.649  . Ginkgo Biloba Extract (GINKGO BILOBA MEMORY ENHANCER) 60 MG CAPS Take 1 capsule by mouth daily.  Mar[place] Kitchen lisinopril (PRINIVIL,ZESTRIL) 2.5 MG tablet TAKE 1 TABLET BY MOUTH ONCE DAILY  . Multiple Vitamins-Minerals (PRESERVISION AREDS) CAPS Take 1 capsule by mouth 2 (two) times daily.   . pioglitazone (ACTOS) 15 MG tablet Take 1 tablet by mouth daily.  . potassium chloride SA (K-DUR,KLOR-CON) 20 MEQ tablet Take 20 mEq by mouth at bedtime.   . simvastatin (ZOCOR) 10 MG tablet TAKE ONE TABLET BY MOUTH AT BEDTIME FOR  ELEVATED  CHOLESTEROL  . [DISCONTINUED] Continuous Blood Gluc Sensor (FREESTYLE LIBRE 14 DAY SENSOR) MISC 1 Cartridge by Does not apply route every 14 (fourteen) days. Dx: E11.649  . [DISCONTINUED] Continuous Blood Gluc Sensor (FREESTYLE LIBRE 14 DAY SENSOR) MISC 1 Cartridge by Does not apply route every 14 (fourteen) days. Dx: E11.649    Allergies: Codeine, Contrast media [iodinated diagnostic agents], Fish allergy, and Milk-related compounds  -Without any acute concerns today Review of Systems  Constitutional: Negative for chills, diaphoresis, fever, malaise/fatigue and weight loss.  HENT: Negative for congestion, sore throat and tinnitus.   Eyes: Negative for blurred vision, double vision and photophobia.  Respiratory: Negative for cough and wheezing.   Cardiovascular: Negative for chest pain and palpitations.  Gastrointestinal: Negative for blood in stool, diarrhea, nausea and vomiting.  Genitourinary: Negative for dysuria, frequency and urgency.  Musculoskeletal: Negative for joint pain and myalgias.  Skin: Negative for itching and rash.  Neurological: Negative for dizziness, focal weakness, weakness and headaches.   Endo/Heme/Allergies: Negative for environmental allergies and polydipsia. Does not bruise/bleed easily.  Psychiatric/Behavioral: Negative for depression and memory loss. The patient is not nervous/anxious and does not have insomnia.       Objective:   Blood pressure (!) 147/86, pulse 84, temperature 98.4 F (36.9 C), height 5' (1.524 m), weight 119 lb 6.4 oz (54.2 kg), SpO2 98 %. Body mass index is 23.32 kg/m. General: Well Developed, well nourished, and in no acute distress.  Neuro: Alert and oriented x3, extra-ocular muscles intact, sensation grossly intact.  HEENT:[place]/AT, PERRLA, neck supple, No carotid bruits Skin: no gross rashes  Cardiac: Regular rate and rhythm Respiratory: Essentially clear to auscultation bilaterally. Not using accessory muscles, speaking in full sentences.  Abdominal: not grossly distended Musculoskeletal: Ambulates w/o diff, FROM * 4 ext.  Vasc: less 2 sec cap RF, warm and pink  Psych:  No HI/SI, judgement and insight good, Euthymic mood. Full Affect.

## 2019-05-05 ENCOUNTER — Other Ambulatory Visit (INDEPENDENT_AMBULATORY_CARE_PROVIDER_SITE_OTHER): Payer: Medicare Other

## 2019-05-05 ENCOUNTER — Other Ambulatory Visit: Payer: Self-pay

## 2019-05-05 DIAGNOSIS — E0822 Diabetes mellitus due to underlying condition with diabetic chronic kidney disease: Secondary | ICD-10-CM

## 2019-05-05 DIAGNOSIS — I152 Hypertension secondary to endocrine disorders: Secondary | ICD-10-CM

## 2019-05-05 DIAGNOSIS — D631 Anemia in chronic kidney disease: Secondary | ICD-10-CM

## 2019-05-05 DIAGNOSIS — E1159 Type 2 diabetes mellitus with other circulatory complications: Secondary | ICD-10-CM | POA: Diagnosis not present

## 2019-05-05 DIAGNOSIS — N184 Chronic kidney disease, stage 4 (severe): Secondary | ICD-10-CM | POA: Diagnosis not present

## 2019-05-05 DIAGNOSIS — H353212 Exudative age-related macular degeneration, right eye, with inactive choroidal neovascularization: Secondary | ICD-10-CM | POA: Diagnosis not present

## 2019-05-05 DIAGNOSIS — I10 Essential (primary) hypertension: Secondary | ICD-10-CM | POA: Diagnosis not present

## 2019-05-05 DIAGNOSIS — N189 Chronic kidney disease, unspecified: Secondary | ICD-10-CM | POA: Diagnosis not present

## 2019-05-05 DIAGNOSIS — R5382 Chronic fatigue, unspecified: Secondary | ICD-10-CM

## 2019-05-05 DIAGNOSIS — E785 Hyperlipidemia, unspecified: Secondary | ICD-10-CM | POA: Diagnosis not present

## 2019-05-05 DIAGNOSIS — N183 Chronic kidney disease, stage 3 unspecified: Secondary | ICD-10-CM

## 2019-05-06 LAB — LIPID PANEL
Chol/HDL Ratio: 2.7 ratio (ref 0.0–4.4)
Cholesterol, Total: 157 mg/dL (ref 100–199)
HDL: 58 mg/dL (ref >39)
LDL Calculated: 83 mg/dL (ref 0–99)
Triglycerides: 81 mg/dL (ref 0–149)
VLDL Cholesterol Cal: 16 mg/dL (ref 5–40)

## 2019-05-06 LAB — COMPREHENSIVE METABOLIC PANEL
ALT: 7 IU/L (ref 0–32)
AST: 18 IU/L (ref 0–40)
Albumin/Globulin Ratio: 1.8 (ref 1.2–2.2)
Albumin: 4.3 g/dL (ref 3.6–4.6)
Alkaline Phosphatase: 92 IU/L (ref 39–117)
BUN/Creatinine Ratio: 16 (ref 12–28)
BUN: 17 mg/dL (ref 8–27)
Bilirubin Total: 0.4 mg/dL (ref 0.0–1.2)
CO2: 23 mmol/L (ref 20–29)
Calcium: 9.6 mg/dL (ref 8.7–10.3)
Chloride: 99 mmol/L (ref 96–106)
Creatinine, Ser: 1.08 mg/dL — ABNORMAL HIGH (ref 0.57–1.00)
GFR calc Af Amer: 55 mL/min/{1.73_m2} — ABNORMAL LOW (ref >59)
GFR calc non Af Amer: 48 mL/min/{1.73_m2} — ABNORMAL LOW (ref >59)
Globulin, Total: 2.4 g/dL (ref 1.5–4.5)
Glucose: 129 mg/dL — ABNORMAL HIGH (ref 65–99)
Potassium: 4.2 mmol/L (ref 3.5–5.2)
Sodium: 140 mmol/L (ref 134–144)
Total Protein: 6.7 g/dL (ref 6.0–8.5)

## 2019-05-06 LAB — CBC WITH DIFFERENTIAL/PLATELET
Basophils Absolute: 0 10*3/uL (ref 0.0–0.2)
Basos: 0 %
EOS (ABSOLUTE): 0.2 10*3/uL (ref 0.0–0.4)
Eos: 5 %
Hematocrit: 36.7 % (ref 34.0–46.6)
Hemoglobin: 12.2 g/dL (ref 11.1–15.9)
Immature Grans (Abs): 0 10*3/uL (ref 0.0–0.1)
Immature Granulocytes: 0 %
Lymphocytes Absolute: 1 10*3/uL (ref 0.7–3.1)
Lymphs: 22 %
MCH: 30.2 pg (ref 26.6–33.0)
MCHC: 33.2 g/dL (ref 31.5–35.7)
MCV: 91 fL (ref 79–97)
Monocytes Absolute: 0.4 10*3/uL (ref 0.1–0.9)
Monocytes: 9 %
Neutrophils Absolute: 2.9 10*3/uL (ref 1.4–7.0)
Neutrophils: 64 %
Platelets: 264 10*3/uL (ref 150–450)
RBC: 4.04 x10E6/uL (ref 3.77–5.28)
RDW: 11.9 % (ref 11.7–15.4)
WBC: 4.5 10*3/uL (ref 3.4–10.8)

## 2019-05-06 LAB — T3: T3, Total: 112 ng/dL (ref 71–180)

## 2019-05-06 LAB — HEMOGLOBIN A1C
Est. average glucose Bld gHb Est-mCnc: 154 mg/dL
Hgb A1c MFr Bld: 7 % — ABNORMAL HIGH (ref 4.8–5.6)

## 2019-05-06 LAB — VITAMIN D 25 HYDROXY (VIT D DEFICIENCY, FRACTURES): Vit D, 25-Hydroxy: 47.6 ng/mL (ref 30.0–100.0)

## 2019-05-06 LAB — T4, FREE: Free T4: 1.08 ng/dL (ref 0.82–1.77)

## 2019-05-06 LAB — TSH: TSH: 2.29 u[IU]/mL (ref 0.450–4.500)

## 2019-05-11 ENCOUNTER — Encounter: Payer: Self-pay | Admitting: Family Medicine

## 2019-05-12 ENCOUNTER — Encounter: Payer: Self-pay | Admitting: Family Medicine

## 2019-05-12 ENCOUNTER — Ambulatory Visit (INDEPENDENT_AMBULATORY_CARE_PROVIDER_SITE_OTHER): Payer: Medicare Other | Admitting: Family Medicine

## 2019-05-12 ENCOUNTER — Other Ambulatory Visit: Payer: Self-pay

## 2019-05-12 VITALS — BP 118/73 | Temp 97.4°F | Ht 60.0 in | Wt 117.0 lb

## 2019-05-12 DIAGNOSIS — E1169 Type 2 diabetes mellitus with other specified complication: Secondary | ICD-10-CM | POA: Diagnosis not present

## 2019-05-12 DIAGNOSIS — D631 Anemia in chronic kidney disease: Secondary | ICD-10-CM

## 2019-05-12 DIAGNOSIS — E785 Hyperlipidemia, unspecified: Secondary | ICD-10-CM

## 2019-05-12 DIAGNOSIS — N189 Chronic kidney disease, unspecified: Secondary | ICD-10-CM

## 2019-05-12 DIAGNOSIS — N184 Chronic kidney disease, stage 4 (severe): Secondary | ICD-10-CM

## 2019-05-12 DIAGNOSIS — E1159 Type 2 diabetes mellitus with other circulatory complications: Secondary | ICD-10-CM

## 2019-05-12 DIAGNOSIS — I10 Essential (primary) hypertension: Secondary | ICD-10-CM

## 2019-05-12 NOTE — Progress Notes (Signed)
Virtual / live video office visit note for [employer], D.O- Primary Care Physician at [facility]   I connected with current patient today and beyond visually recognizing the correct individual, I verified that I am speaking with the correct person using two identifiers.  . Location of the patient: Home . Location of the provider: Office Only the patient (+/- their family members at pt's discretion) and myself were participating in the encounter    - This visit type was conducted due to national recommendations for restrictions regarding the COVID-19 Pandemic (e.g. social distancing) in an effort to limit this patient's exposure and mitigate transmission in our community.  This format is felt to be most appropriate for this patient at this time.   - The patient did have access to video technology today yet, we had technical difficulties with this method, requiring transitioning to audio only.    - No physical exam could be performed with this format, beyond that communicated to [place] by the patient/ family members as noted.   - Additionally my office staff/ schedulers discussed with the patient that there may be a monetary charge related to this service, depending on patient's medical insurance.   The patient expressed understanding, and agreed to proceed.      History of Present Illness:  Established as new patient at end of July 2020.  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  Says about the only fruit she eats is bananas and that she doesn't really eat vegetables.  Notes she is also lactose intolerant.   Several suggestions I make about her diet she shoots down today.  DM HPI:  Patient's daughter states "she doesn't eat, she eats maybe two meals a day, and her sugar gets in the 90's."  Medication compliance - continues on Actos as prescribed by former provider.  Did not tolerate metformin well.  Home fasting glucose  readings: not regularly checking. Fasting may have been 129 August 3rd.  2 hr PP: not checking   Denies polyuria/polydipsia.  Denies hypo/ hyperglycemia symptoms  Last diabetic eye exam was  Lab Results  Component Value Date   HMDIABEYEEXA No Retinopathy 07/08/2017    Last A1C in the office was:  Lab Results  Component Value Date   HGBA1C 7.0 (H) 05/05/2019   HGBA1C 7.1 (A) 04/08/2019   HGBA1C 8.0 (H) 04/30/2018    Lab Results  Component Value Date   MICROALBUR 1.0 08/16/2017   LDLCALC 83 05/05/2019   CREATININE 1.08 (H) 05/05/2019    Wt Readings from Last 3 Encounters:  05/12/19 117 lb (53.1 kg)  04/08/19 119 lb 6.4 oz (54.2 kg)  05/09/18 125 lb 12.8 oz (57.1 kg)    BP Readings from Last 3 Encounters:  05/12/19 118/73  04/08/19 (!) 147/86  05/09/18 132/86    1. HTN HPI:  -  Her blood pressure has been controlled at home.  Pt is checking it at home.   BP readings recently: 118/73 117/72 123/80  - Patient reports good compliance with blood pressure medications.  Patient states she is taking it every day, but last PCP (Dr. Eulas Post) filled it on 03/28/2018, and patient was given only a 6 months supply at that time and has not asked for a refill.  I presume that medicine is being filled by her nephrologist.  - Denies medication S-E   - Smoking Status noted   - She denies new  onset of: chest pain, exercise intolerance, shortness of breath, dizziness, visual changes, headache, lower extremity swelling or claudication.   Last 3 blood pressure readings in our office are as follows: BP Readings from Last 3 Encounters:  05/12/19 118/73  04/08/19 (!) 147/86  05/09/18 132/86    Filed Weights   05/12/19 0827  Weight: 117 lb (53.1 kg)       Impression and Recommendations:    1. Type 2 diabetes mellitus with other specified complication, unspecified whether long term insulin use ([place])   2. Hyperlipidemia, unspecified hyperlipidemia type   3. Hypertension  associated with diabetes ([place])   4. Chronic kidney disease (CKD) stage G4/A1, severely decreased glomerular filtration rate (GFR) between 15-29 mL/min/1.73 square meter and albuminuria creatinine ratio less than 30 mg/g (HCC)   5. Anemia in chronic kidney disease, unspecified CKD stage     Type 2 diabetes mellitus with other specified complication, unspecified whether long term insulin use - A1c last check was 7.0. - Discussed that an A1c of 7.0 is at goal for an 84 year old.  - Discussed changing diabetes medication from Actos to something else with less side-effect profile and better for her kidneys. - Per patient, tried metformin in past, but this exacerbated her kidney disease. - Advised patient to call insurance and ask about Tier 1 and Tier 2 medications within their plan. - Patient's daughter knows to let us know the names of medications within their plan.  - Advised patient and daughter not to take patient's blood sugar at random. - Check FBS and 2 hours after the biggest meal of your day.  Keep log and bring in next OV for my review.  Also, if you ever feel poorly, please check your blood pressure and blood sugar, as one or the other could be the cause of your symptoms.  - Counseled patient on pathophysiology of disease and discussed various treatment options, which often includes dietary and lifestyle modifications as first line.  Importance of low carb/prudent healthy diet discussed with patient in addition to regular exercise.   - Being a diabetic, you need yearly eye and foot exams. Make appt.for diabetic eye exam.  - Reviewed recent lab work (05/05/2019) in depth with patient today.  All lab work within normal limits unless otherwise noted. Extensive education was provided and all questions were answered.  - Thyroid WNL - Fatigue per Pt - To help improve fatigue, advised patient to exercise per her tolerance level. - Advised activity using her walker on flat ground, to remain  safe.  - Calcium is WNL at 9.6 - Pt with osteoporosis - Discussed importance of adequate calcium values. - Encouraged patient to engage in weight bearing activities to help improve bone density.  - Vitamin D - WNL - Discussed importance of adequate amounts of Vitamin D.  - Liver function, WNL and stable.  - CBC = WNL - Discussed that patient shows no anemia, no signs of leukemia or lymphoma.  - Chronic kidney disease (CKD) stage G4/A1, severely decreased glomerular filtration rate (GFR) between 15-29 mL/min/1.73 square meter and albuminuria creatinine ratio less than 30 mg/g (HCC) - Kidney health managed by nephrology. - Continue treatment plan as established. - Serum creatinine 1.08, stable. - Discussed importance of maintaining kidney health.  All questions were answered.  Hypertension associated with diabetes ([place]) - Stable at this time. - Controlled on current management.  - Continue treatment plan as prescribed.  See med list. - Ongoing ambulatory BP monitoring encouraged. Keep  log and bring in next OV.  - Given patient's kidney disease, discussed critical importance of controlled blood pressure and blood sugar  - Advised patient to check blood sugar and blood pressure whenever she feels dizzy or lightheaded, to check what may be affecting her.  - Will continue to monitor.  Hyperlipidemia, unspecified hyperlipidemia type  - Stable at this time. - Continue medication as prescribed.  See med list.  - Discussed importance of keeping cholesterol under control as patient has DM and history of stroke.  Triglycerides = 81. HDL = WNL at 58, down from 60 prior. LDL = WNL at 83.  - Goal LDL under 70; repeat labs in 6 months to a year. - If LDL remains up, we may increase medications a little bit. - Otherwise, continue medications as prescribed.  - To increase HDL value, strongly encouraged adequate exercise. - Work on consuming a cholesterol prudent diet, exercising, and  decreasing saturated and trans fats.  - Patient states doesn't like vegetables, doesn't like fruit (aside from bananas), and does not like eating them.  - Education provided and questions were answered.  - Discussed critical importance of appropriate multifactorial health maintenance given patient's concurrent medical concerns.  - Return in 4 months. - Pt will call back with preferred diabetes medication to replace Actos.  - As part of my medical decision making, I reviewed the following data within the [place] History obtained from pt /family, CMA notes reviewed and incorporated if applicable, Labs reviewed, Radiograph/ tests reviewed if applicable and OV notes from prior OV's with me, as well as other specialists she/he has seen since seeing me last, were all reviewed and used in my medical decision making process today.   - Additionally, discussion had with patient regarding txmnt plan, their biases about that plan etc were used in my medical decision making today.   - The patient agreed with the plan and demonstrated an understanding of the instructions.   No barriers to understanding were identified.   - Red flag symptoms and signs discussed in detail.  Patient expressed understanding regarding what to do in case of emergency\ urgent symptoms.  The patient was advised to call back or seek an in-person evaluation if the symptoms worsen or if the condition fails to improve as anticipated.  Return for 4 months- DM, Bp etc Will call back with preferred diabetes medicines to replace Actos.   I provided 26+ minutes of non-face-to-face time during this encounter,with over 50% of the time in direct counseling on patients medical conditions/ medical concerns.  Additional time was spent with charting and coordination of care after the actual visit commenced.   Note:  This note was prepared with assistance of Dragon voice recognition software. Occasional wrong-word or sound-a-like  substitutions may have occurred due to the inherent limitations of voice recognition software.  This document serves as a record of services personally performed by Mellody Dance, DO. It was created on her behalf by Toni Amend, a trained medical scribe. The creation of this record is based on the scribe's personal observations and the provider's statements to them.   I have reviewed the above medical documentation for accuracy and completeness and I concur.  Mellody Dance, DO 05/12/2019 11:09 AM     Patient Care Team    Relationship Specialty Notifications Start End  Mellody Dance, DO PCP - General Family Medicine  04/08/19   Milus Height, MD Referring Physician Ophthalmology  10/21/14   Rexene Agent, MD  Attending Physician Nephrology  10/21/14   Gildardo Cranker, [place] Physician Internal Medicine  11/19/14   Eulogio Bear, MD Consulting Physician Ophthalmology  03/20/17   Garrel Ridgel, DPM Consulting Physician Podiatry  04/08/19   [place], [place]    04/08/19    Comment: Dr Joelyn Oms- for CKD  Ortho, [place]    04/08/19    Comment: dr Delorise Jackson or something     -Vitals obtained; medications/ allergies reconciled;  personal medical, social, Sx etc.histories were updated by CMA, reviewed by me and are reflected in chart  Patient Active Problem List   Diagnosis Date Noted  . Hyperlipidemia associated with type 2 diabetes mellitus ([place]) 04/08/2019    Priority: High  . Hypertension associated with diabetes ([place]) 03/17/2014    Priority: High  . Diabetes mellitus, type 2 ([place]) 03/17/2014    Priority: High  . Diabetes mellitus with diabetic nephropathy ([place]) 04/06/2015    Priority: Medium  . Chronic kidney disease (CKD) stage G4/A1, severely decreased glomerular filtration rate (GFR) between 15-29 mL/min/1.73 square meter and albuminuria creatinine ratio less than 30 mg/g (HCC) 10/22/2014    Priority: Medium  . H/O noncompliance with medical treatment, presenting  hazards to health 04/08/2019    Priority: Low  . Establishing care with new doctor, encounter for 04/08/2019  . Diabetes mellitus due to underlying condition with stage 3 chronic kidney disease, without long-term current use of insulin ([place]) 04/08/2019  . Cerebrovascular accident (CVA) ([place]) 04/08/2019  . Macular degeneration (senile) of retina/  legally blind 04/08/2019  . Family history of stroke or transient ischemic attack in mother died age 16; several other fam members also has CVAs 04/08/2019  . Legally blind 04/08/2019  . Compression fracture of thoracic vertebra (HCC) 09/17/2018  . Hypoglycemia due to type 2 diabetes mellitus ([place]) 01/31/2018  . Chronic fatigue 01/31/2018  . Vertigo 01/31/2018  . Osteoporosis 07/08/2015  . History of CVA with residual deficit 10/22/2014  . Macular degeneration of both eyes 10/22/2014  . Anemia in chronic kidney disease 10/22/2014  . Hyperlipidemia 03/17/2014     Current Meds  Medication Sig  . aspirin EC 81 MG tablet Take 1 tablet by mouth daily.  . chlorpheniramine ([employer] ALLERGY RELIEF) 4 MG tablet Take 4 mg by mouth 2 (two) times daily as needed for allergies.  . chlorthalidone (HYGROTON) 25 MG tablet Take 12.5 mg by mouth daily. Filled by Nephro Doc  . Continuous Blood Gluc Receiver (FREESTYLE LIBRE 14 DAY READER) DEVI 1 Device by Does not apply route 3 (three) times daily. Dx: E11.649  . Continuous Blood Gluc Sensor (FREESTYLE LIBRE 14 DAY SENSOR) MISC 1 Cartridge by Does not apply route every 14 (fourteen) days. Dx: E11.649  . Ginkgo Biloba Extract (GINKGO BILOBA MEMORY ENHANCER) 60 MG CAPS Take 1 capsule by mouth daily.  Marland Kitchen lisinopril (PRINIVIL,ZESTRIL) 2.5 MG tablet TAKE 1 TABLET BY MOUTH ONCE DAILY  . Multiple Vitamins-Minerals (PRESERVISION AREDS) CAPS Take 1 capsule by mouth 2 (two) times daily.   . pioglitazone (ACTOS) 15 MG tablet Take 1 tablet by mouth daily.  . potassium chloride SA (K-DUR,KLOR-CON) 20 MEQ tablet Take 20 mEq by  mouth at bedtime.   . simvastatin (ZOCOR) 10 MG tablet TAKE ONE TABLET BY MOUTH AT BEDTIME FOR  ELEVATED  CHOLESTEROL     Allergies  Allergen Reactions  . Codeine Other (See Comments)    hbp  . Contrast Media [Iodinated Diagnostic Agents]   . Fish Allergy   . Milk-Related Compounds  ROS:  See above HPI for pertinent positives and negatives   Objective:   Blood pressure 118/73, temperature (!) 97.4 F (36.3 C), temperature source Oral, height 5' (1.524 m), weight 117 lb (53.1 kg).  (if some vitals are omitted, this means that patient was UNABLE to obtain them even though they were asked to get them prior to OV today.  They were asked to call us at their earliest convenience with these once obtained.)  General: A & O * 3; visually in no acute distress; in usual state of health.  Skin: Visible skin appears normal and pt's usual skin color HEENT:  EOMI, head is normocephalic and atraumatic.  Sclera are anicteric. Neck has a good range of motion.  Lips are noncyanotic Chest: normal chest excursion and movement Respiratory: speaking in full sentences, no conversational dyspnea; no use of accessory muscles Psych: insight good, mood- appears full

## 2019-05-26 ENCOUNTER — Telehealth: Payer: Self-pay | Admitting: Family Medicine

## 2019-05-26 NOTE — Telephone Encounter (Signed)
Patient's daughter called states Mom said someone called her --do not see any documentation other message between [facility] & provider.  --Forwarding message.  --glh

## 2019-05-26 NOTE — Telephone Encounter (Signed)
Spoke with pt's daughter and advised that no one from our office called the pt yesterday.  Charyl Bigger, CMA

## 2019-06-01 ENCOUNTER — Encounter: Payer: Self-pay | Admitting: Family Medicine

## 2019-06-18 ENCOUNTER — Encounter: Payer: Self-pay | Admitting: Family Medicine

## 2019-06-29 ENCOUNTER — Other Ambulatory Visit: Payer: Self-pay

## 2019-06-29 ENCOUNTER — Encounter: Payer: Self-pay | Admitting: Family Medicine

## 2019-06-29 MED ORDER — PIOGLITAZONE HCL 15 MG PO TABS: 15.0000 mg | ORAL_TABLET | Freq: Every day | ORAL | 0 refills | Status: DC

## 2019-06-29 MED ORDER — SIMVASTATIN 10 MG PO TABS: 10.0000 mg | ORAL_TABLET | Freq: Every day | ORAL | 0 refills | Status: DC

## 2019-06-29 MED ORDER — LISINOPRIL 2.5 MG PO TABS: 2.5000 mg | ORAL_TABLET | Freq: Every day | ORAL | 0 refills | Status: DC

## 2019-06-30 DIAGNOSIS — H353212 Exudative age-related macular degeneration, right eye, with inactive choroidal neovascularization: Secondary | ICD-10-CM | POA: Diagnosis not present

## 2019-08-25 DIAGNOSIS — H353212 Exudative age-related macular degeneration, right eye, with inactive choroidal neovascularization: Secondary | ICD-10-CM | POA: Diagnosis not present

## 2019-09-10 ENCOUNTER — Other Ambulatory Visit: Payer: Self-pay

## 2019-09-10 ENCOUNTER — Encounter: Payer: Self-pay | Admitting: Family Medicine

## 2019-09-10 ENCOUNTER — Ambulatory Visit (INDEPENDENT_AMBULATORY_CARE_PROVIDER_SITE_OTHER): Payer: Medicare Other | Admitting: Family Medicine

## 2019-09-10 VITALS — BP 113/68 | Wt 110.2 lb

## 2019-09-10 DIAGNOSIS — E559 Vitamin D deficiency, unspecified: Secondary | ICD-10-CM

## 2019-09-10 DIAGNOSIS — E1159 Type 2 diabetes mellitus with other circulatory complications: Secondary | ICD-10-CM | POA: Diagnosis not present

## 2019-09-10 DIAGNOSIS — E785 Hyperlipidemia, unspecified: Secondary | ICD-10-CM

## 2019-09-10 DIAGNOSIS — R5382 Chronic fatigue, unspecified: Secondary | ICD-10-CM

## 2019-09-10 DIAGNOSIS — N183 Chronic kidney disease, stage 3 unspecified: Secondary | ICD-10-CM

## 2019-09-10 DIAGNOSIS — I152 Hypertension secondary to endocrine disorders: Secondary | ICD-10-CM

## 2019-09-10 DIAGNOSIS — I639 Cerebral infarction, unspecified: Secondary | ICD-10-CM

## 2019-09-10 DIAGNOSIS — E0822 Diabetes mellitus due to underlying condition with diabetic chronic kidney disease: Secondary | ICD-10-CM

## 2019-09-10 DIAGNOSIS — E1169 Type 2 diabetes mellitus with other specified complication: Secondary | ICD-10-CM | POA: Diagnosis not present

## 2019-09-10 DIAGNOSIS — N189 Chronic kidney disease, unspecified: Secondary | ICD-10-CM

## 2019-09-10 DIAGNOSIS — N184 Chronic kidney disease, stage 4 (severe): Secondary | ICD-10-CM

## 2019-09-10 DIAGNOSIS — I10 Essential (primary) hypertension: Secondary | ICD-10-CM

## 2019-09-10 DIAGNOSIS — D631 Anemia in chronic kidney disease: Secondary | ICD-10-CM

## 2019-09-10 DIAGNOSIS — E1121 Type 2 diabetes mellitus with diabetic nephropathy: Secondary | ICD-10-CM

## 2019-09-10 MED ORDER — PIOGLITAZONE HCL 15 MG PO TABS: 15.0000 mg | ORAL_TABLET | Freq: Every day | ORAL | 1 refills | Status: DC

## 2019-09-10 MED ORDER — LISINOPRIL 2.5 MG PO TABS: 2.5000 mg | ORAL_TABLET | Freq: Every day | ORAL | 1 refills | Status: DC

## 2019-09-10 MED ORDER — SIMVASTATIN 10 MG PO TABS: 10.0000 mg | ORAL_TABLET | Freq: Every day | ORAL | 1 refills | Status: DC

## 2019-09-10 MED ORDER — VITAMIN D 25 MCG (1000 UNIT) PO TABS: 1000.0000 [IU] | ORAL_TABLET | Freq: Every day | ORAL | Status: AC

## 2019-09-10 NOTE — Progress Notes (Signed)
Telehealth office visit note for Shirley Sanders, D.O- at [facility]   I connected with current patient today and verified that I am speaking with the correct person using two identifiers.   . Location of the patient: Home . Location of the provider: Office Only the patient (+/- their family members at pt's discretion) and myself were participating in the encounter - This visit type was conducted due to national recommendations for restrictions regarding the COVID-19 Pandemic (e.g. social distancing) in an effort to limit this patient's exposure and mitigate transmission in our community.  This format is felt to be most appropriate for this patient at this time.   - The patient did not have access to video technology or had technical difficulties with video requiring transitioning to audio format only. - No physical exam could be performed with this format, beyond that communicated to [place] by the patient/ family members as noted.   - Additionally my office staff/ schedulers discussed with the patient that there may be a monetary charge related to this service, depending on their medical insurance.   The patient expressed understanding, and agreed to proceed.      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  Notes she's doing fine today, and "there's nothing to be down about."  "I'm too blessed to be stressed."  She went to her son's for Thanksgiving.  Notes there were 10 people present, and "everybody spread out and had their masks on."  - Physical Activity States "I'm on the move all the time because we do the shopping for food for seniors several times per week, shopping and bagging it and taking it there."  Notes her daughter also got her a "little cycle thing" where she can use her hands, feet, and legs.  "I'm not one that sits a whole lot at a time; I'm up and down from one end of the house to the other 50 times  per day or more."  "I've never been one to sit still long; my kids say I have ADD."  - Vitamin Supplementation She is not taking her Vitamin D.  Daughter notes "she only takes PreserVision for her eyes."  HPI:   Diabetes Mellitus:  Confirms that she has felt well and as though her sugars are okay.  Patient continues eating "maybe" two meals per day.  States "I eat my peanut butter!"  Home glucose readings:  Her fasting sugars run from 132, 152, 146. Her postprandial sugars are running 197, 145, some as low as 136.  They deny low blood sugars or symptomatic lows.    - Patient reports good compliance with therapy plan: medication and/or lifestyle modification  - Her denies acute concerns or problems related to treatment plan  - She denies new concerns.  Denies polyuria/polydipsia, hypo/ hyperglycemia symptoms.  Denies new onset of: chest pain, exercise intolerance, shortness of breath, dizziness, visual changes, headache, lower extremity swelling or claudication.   Last A1C in the office was:  Lab Results  Component Value Date   HGBA1C 7.0 (H) 05/05/2019   HGBA1C 7.1 (A) 04/08/2019   HGBA1C 8.0 (H) 04/30/2018   Lab Results  Component Value Date   MICROALBUR 1.0 08/16/2017   LDLCALC 83 05/05/2019   CREATININE 1.08 (H) 05/05/2019   BP Readings from Last 3 Encounters:  09/10/19 113/68  05/12/19 118/73  04/08/19 (!) 147/86   Wt Readings from Last 3  Encounters:  09/10/19 110 lb 3.2 oz (50 kg)  05/12/19 117 lb (53.1 kg)  04/08/19 119 lb 6.4 oz (54.2 kg)   HPI:  Hypertension:  "My blood pressure has been great."  -  Her blood pressure at home has been running: 113/68, 117/69, 120/82.  - Patient reports good compliance with medication and/or lifestyle modification  - Her denies acute concerns or problems related to treatment plan  - She denies new onset of: chest pain, exercise intolerance, shortness of breath, dizziness, visual changes, headache, lower extremity swelling  or claudication.   Last 3 blood pressure readings in our office are as follows: BP Readings from Last 3 Encounters:  09/10/19 113/68  05/12/19 118/73  04/08/19 (!) 147/86   Filed Weights   09/10/19 0834  Weight: 110 lb 3.2 oz (50 kg)   HPI:  Hyperlipidemia:  84 y.o. female here for cholesterol follow-up.   - Patient reports good compliance with treatment plan of:  medication and/ or lifestyle management.    - Patient denies any acute concerns or problems with management plan   - She denies new onset of: myalgias, arthralgias, increased fatigue more than normal, chest pains, exercise intolerance, shortness of breath, dizziness, visual changes, headache, lower extremity swelling or claudication.   Most recent cholesterol panel was:  Lab Results  Component Value Date   CHOL 157 05/05/2019   HDL 58 05/05/2019   LDLCALC 83 05/05/2019   TRIG 81 05/05/2019   CHOLHDL 2.7 05/05/2019   Hepatic Function Latest Ref Rng & Units 05/05/2019 04/30/2018 01/29/2018  Total Protein 6.0 - 8.5 g/dL 6.7 - -  Albumin 3.6 - 4.6 g/dL 4.3 - -  AST 0 - 40 IU/L 18 - -  ALT 0 - 32 IU/L 7 10 11   Alk Phosphatase 39 - 117 IU/L 92 - -  Total Bilirubin 0.0 - 1.2 mg/dL 0.4 - -    No flowsheet data found.  Depression screen [facility] 2/9 09/10/2019 04/08/2019 08/16/2017 05/16/2016 04/06/2015  Decreased Interest 0 0 0 0 0  Down, Depressed, Hopeless 0 0 0 0 0  PHQ - 2 Score 0 0 0 0 0  Altered sleeping 1 1 - - -  Tired, decreased energy 0 0 - - -  Change in appetite 1 2 - - -  Feeling bad or failure about yourself  0 0 - - -  Trouble concentrating 0 0 - - -  Moving slowly or fidgety/restless 0 0 - - -  Suicidal thoughts 0 0 - - -  PHQ-9 Score 2 3 - - -  Difficult doing work/chores Not difficult at all - - - -      Impression and Recommendations:    1. Vitamin D insufficiency   2. Type 2 diabetes mellitus with other specified complication, unspecified whether long term insulin use ([place])   3. Hyperlipidemia,  unspecified hyperlipidemia type   4. Hypertension associated with diabetes ([place])   5. Chronic kidney disease (CKD) stage G4/A1, severely decreased glomerular filtration rate (GFR) between 15-29 mL/min/1.73 square meter and albuminuria creatinine ratio less than 30 mg/g (HCC)   6. Anemia in chronic kidney disease, unspecified CKD stage   7. Cerebrovascular accident (CVA), unspecified mechanism ([place])   8. Chronic fatigue   9. Diabetes mellitus due to underlying condition with stage 3 chronic kidney disease, without long-term current use of insulin, unspecified whether stage 3a or 3b CKD ([place])   10. Type 2 diabetes mellitus with diabetic nephropathy, unspecified whether long term insulin  use ([place])      Vitamin D Insufficiency - Last checked four months ago at 12. - Reviewed goal value of 50.  - Advised patient to take 1000 IU's daily of Vitamin D in winter. - Otherwise, continue multivitamin as established.  - Will continue to monitor and re-check as recommended.  Diabetes Mellitus - Discussed that patient's A1c most 4 months ago was 7.0, at goal.  - Reviewed in the past that we discussed alternatives to Actos. - Per patient's daughter, insurance will not adequately cover other options. - Pt will continue current treatment regime.  See med list.  - Encouraged patient to monitor for low blood sugars and report if she is ever feeling symptomatic.  - Counseled patient on pathophysiology of disease and discussed various treatment options, which always includes dietary and lifestyle modification as first line.    - Importance of low carb, heart-healthy diet discussed with patient in addition to regular aerobic exercise of 92mn 5d/week or more.   - Check FBS and 2 hours after the biggest meal of your day.  Keep log and bring in next OV for my review.     - Also told patient if you ever feel poorly, please check your blood pressure and blood sugar, as one or the other could be the cause of  your symptoms.  - Pt reminded about need for yearly eye and foot exams.  Told patient to make appt.for diabetic eye exam, CMAs here will do foot exams  - Handouts provided at patient's desire and or told to go online at the American Diabetes Association website for further information  - We will continue to monitor  Kidney Health - CKD - Discussed that patient's kidney function was stable last check with serum creatinine at 1.08. - Will continue to monitor and re-check as recommended.  Hypertension associated with DM - Blood pressure currently is at goal. - Patient will continue current treatment regimen  - Counseled patient on pathophysiology of disease and discussed various treatment options, which always includes dietary and lifestyle modification as first line.   - Lifestyle changes such as dash and heart healthy diets and engaging in a regular exercise program discussed extensively with patient.   - Ambulatory blood pressure monitoring encouraged at least 3 times weekly.  Keep log and bring in every office visit.  Reminded patient that if they ever feel poorly in any way, to check their blood pressure and pulse.  - Handouts provided at patient's desire and/or told to go online at the [employer] for further information  - We will continue to monitor  Hyperlipidemia associated with DM - Cholesterol levels are at goal.  - Pt will continue current treatment regimen   Dietary changes such as low saturated & trans fat diets for hyperlipidemia and low carb diets for hypertriglyceridemia discussed with patient.    Encouraged patient to follow AHA guidelines for regular exercise and also engage in weight loss if BMI above 25.   Educational handouts provided at patient's desire and/ or told to look online at the [employer] for further information.  We will continue to monitor  Lifestyle & Preventative Health Maintenance - Encouraged  the patient to continue being physically active, working toward exercising daily to improve overall mental, physical, and emotional health.    - Encouraged patient to engage in daily physical activity, especially a formal exercise routine.  Recommended that the patient eventually strive for at least 150 minutes of moderate  cardiovascular activity per week according to guidelines established by the [facility].   - Healthy dietary habits encouraged, including low-carb, and high amounts of lean protein in diet.   - Patient should also consume adequate amounts of water.  - Health counseling performed.  All questions answered.  Recommendations - Will continue to check A1c every 6 months. - Return for lab-only visit January to check A1c and Vitamin D.   - As part of my medical decision making, I reviewed the following data within the [place] History obtained from pt /family, CMA notes reviewed and incorporated if applicable, Labs reviewed, Radiograph/ tests reviewed if applicable and OV notes from prior OV's with me, as well as other specialists she/he has seen since seeing me last, were all reviewed and used in my medical decision making process today.    - Additionally, discussion had with patient regarding our treatment plan, and their biases/concerns about that plan were used in my medical decision making today.    - The patient agreed with the plan and demonstrated an understanding of the instructions.   No barriers to understanding were identified.    - Red flag symptoms and signs discussed in detail.  Patient expressed understanding regarding what to do in case of emergency\ urgent symptoms.   - The patient was advised to call back or seek an in-person evaluation if the symptoms worsen or if the condition fails to improve as anticipated.   Return for lab-only visit January to check A1c and Vitamin D.    No orders of the defined types were placed in this encounter.   Meds  ordered this encounter  Medications  . cholecalciferol (VITAMIN D3) 25 MCG (1000 UT) tablet    Sig: Take 1 tablet (1,000 Units total) by mouth daily.    Dispense:     . lisinopril (ZESTRIL) 2.5 MG tablet    Sig: Take 1 tablet (2.5 mg total) by mouth daily.    Dispense:  90 tablet    Refill:  1  . pioglitazone (ACTOS) 15 MG tablet    Sig: Take 1 tablet (15 mg total) by mouth daily.    Dispense:  90 tablet    Refill:  1  . simvastatin (ZOCOR) 10 MG tablet    Sig: Take 1 tablet (10 mg total) by mouth daily.    Dispense:  90 tablet    Refill:  1    Medications Discontinued During This Encounter  Medication Reason  . lisinopril (ZESTRIL) 2.5 MG tablet Reorder  . pioglitazone (ACTOS) 15 MG tablet Reorder  . simvastatin (ZOCOR) 10 MG tablet Reorder      I provided 12.5 minutes of non face-to-face time during this encounter.  Additional time was spent with charting and coordination of care after the actual visit commenced.   Note:  This note was prepared with assistance of Dragon voice recognition software. Occasional wrong-word or sound-a-like substitutions may have occurred due to the inherent limitations of voice recognition software.  This document serves as a record of services personally performed by Shirley Dance, DO. It was created on her behalf by Toni Amend, a trained medical scribe. The creation of this record is based on the scribe's personal observations and the provider's statements to them.   This case required medical decision making of at least moderate complexity. The above documentation has been reviewed to be accurate and was completed by Marjory Sneddon, D.O.     Patient Care Team    Relationship  Specialty Notifications Start End  Shirley Dance, DO PCP - General Family Medicine  04/08/19   Milus Height, MD Referring Physician Ophthalmology  10/21/14   Rexene Agent, MD Attending Physician Nephrology  10/21/14   Gildardo Cranker, [place]  Physician Internal Medicine  11/19/14   Eulogio Bear, MD Consulting Physician Ophthalmology  03/20/17   Garrel Ridgel, DPM Consulting Physician Podiatry  04/08/19   [place], [place]    04/08/19    Comment: Dr Joelyn Oms- for CKD  Ortho, [place]    04/08/19    Comment: dr Delorise Jackson or something      -Vitals obtained; medications/ allergies reconciled;  personal medical, social, Sx etc.histories were updated by CMA, reviewed by me and are reflected in chart   Patient Active Problem List   Diagnosis Date Noted  . Hyperlipidemia associated with type 2 diabetes mellitus ([place]) 04/08/2019    Priority: High  . Hypertension associated with diabetes ([place]) 03/17/2014    Priority: High  . Diabetes mellitus, type 2 ([place]) 03/17/2014    Priority: High  . Diabetes mellitus with diabetic nephropathy ([place]) 04/06/2015    Priority: Medium  . Chronic kidney disease (CKD) stage G4/A1, severely decreased glomerular filtration rate (GFR) between 15-29 mL/min/1.73 square meter and albuminuria creatinine ratio less than 30 mg/g (HCC) 10/22/2014    Priority: Medium  . H/O noncompliance with medical treatment, presenting hazards to health 04/08/2019    Priority: Low  . Establishing care with new doctor, encounter for 04/08/2019  . Diabetes mellitus due to underlying condition with stage 3 chronic kidney disease, without long-term current use of insulin ([place]) 04/08/2019  . Cerebrovascular accident (CVA) ([place]) 04/08/2019  . Macular degeneration (senile) of retina/  legally blind 04/08/2019  . Family history of stroke or transient ischemic attack in mother died age 48; several other fam members also has CVAs 04/08/2019  . Legally blind 04/08/2019  . Compression fracture of thoracic vertebra (HCC) 09/17/2018  . Hypoglycemia due to type 2 diabetes mellitus ([place]) 01/31/2018  . Chronic fatigue 01/31/2018  . Vertigo 01/31/2018  . Osteoporosis 07/08/2015  . History of CVA with residual deficit 10/22/2014  . Macular  degeneration of both eyes 10/22/2014  . Anemia in chronic kidney disease 10/22/2014  . Hyperlipidemia 03/17/2014     Current Meds  Medication Sig  . aspirin EC 81 MG tablet Take 1 tablet by mouth daily.  . chlorpheniramine ([employer] ALLERGY RELIEF) 4 MG tablet Take 4 mg by mouth 2 (two) times daily as needed for allergies.  . chlorthalidone (HYGROTON) 25 MG tablet Take 12.5 mg by mouth daily. Filled by Nephro Doc  . Continuous Blood Gluc Receiver (FREESTYLE LIBRE 14 DAY READER) DEVI 1 Device by Does not apply route 3 (three) times daily. Dx: E11.649  . Continuous Blood Gluc Sensor (FREESTYLE LIBRE 14 DAY SENSOR) MISC 1 Cartridge by Does not apply route every 14 (fourteen) days. Dx: E11.649  . Ginkgo Biloba Extract (GINKGO BILOBA MEMORY ENHANCER) 60 MG CAPS Take 1 capsule by mouth daily.  Marland Kitchen lisinopril (ZESTRIL) 2.5 MG tablet Take 1 tablet (2.5 mg total) by mouth daily.  . Multiple Vitamins-Minerals (PRESERVISION AREDS) CAPS Take 1 capsule by mouth 2 (two) times daily.   . pioglitazone (ACTOS) 15 MG tablet Take 1 tablet (15 mg total) by mouth daily.  . potassium chloride SA (K-DUR,KLOR-CON) 20 MEQ tablet Take 20 mEq by mouth at bedtime.   . simvastatin (ZOCOR) 10 MG tablet Take 1 tablet (10 mg total) by mouth daily.  . [  DISCONTINUED] lisinopril (ZESTRIL) 2.5 MG tablet Take 1 tablet (2.5 mg total) by mouth daily.  . [DISCONTINUED] pioglitazone (ACTOS) 15 MG tablet Take 1 tablet (15 mg total) by mouth daily.  . [DISCONTINUED] simvastatin (ZOCOR) 10 MG tablet Take 1 tablet (10 mg total) by mouth daily.     Allergies:  Allergies  Allergen Reactions  . Codeine Other (See Comments)    hbp  . Contrast Media [Iodinated Diagnostic Agents]   . Fish Allergy   . Milk-Related Compounds      ROS:  See above HPI for pertinent positives and negatives   Objective:   Blood pressure 113/68, weight 110 lb 3.2 oz (50 kg).  (if some vitals are omitted, this means that patient was UNABLE to obtain them  even though they were asked to get them prior to OV today.  They were asked to call us at their earliest convenience with these once obtained. )  General: A & O * 3; sounds in no acute distress; in usual state of health.  Skin: Pt confirms warm and dry extremities and pink fingertips HEENT: Pt confirms lips non-cyanotic Chest: Patient confirms normal chest excursion and movement Respiratory: speaking in full sentences, no conversational dyspnea; patient confirms no use of accessory muscles Psych: insight appears good, mood- appears full

## 2019-10-14 ENCOUNTER — Encounter: Payer: Self-pay | Admitting: Family Medicine

## 2019-10-20 DIAGNOSIS — H353212 Exudative age-related macular degeneration, right eye, with inactive choroidal neovascularization: Secondary | ICD-10-CM | POA: Diagnosis not present

## 2019-10-20 DIAGNOSIS — H353223 Exudative age-related macular degeneration, left eye, with inactive scar: Secondary | ICD-10-CM | POA: Diagnosis not present

## 2019-10-22 ENCOUNTER — Other Ambulatory Visit: Payer: Self-pay

## 2019-10-22 DIAGNOSIS — E559 Vitamin D deficiency, unspecified: Secondary | ICD-10-CM

## 2019-10-22 DIAGNOSIS — N183 Chronic kidney disease, stage 3 unspecified: Secondary | ICD-10-CM

## 2019-10-23 ENCOUNTER — Other Ambulatory Visit: Payer: Self-pay

## 2019-10-23 ENCOUNTER — Other Ambulatory Visit: Payer: Medicare Other

## 2019-10-23 DIAGNOSIS — E0822 Diabetes mellitus due to underlying condition with diabetic chronic kidney disease: Secondary | ICD-10-CM | POA: Diagnosis not present

## 2019-10-23 DIAGNOSIS — E559 Vitamin D deficiency, unspecified: Secondary | ICD-10-CM

## 2019-10-23 DIAGNOSIS — N183 Chronic kidney disease, stage 3 unspecified: Secondary | ICD-10-CM

## 2019-10-24 LAB — VITAMIN D 25 HYDROXY (VIT D DEFICIENCY, FRACTURES): Vit D, 25-Hydroxy: 46.9 ng/mL (ref 30.0–100.0)

## 2019-10-24 LAB — HEMOGLOBIN A1C
Est. average glucose Bld gHb Est-mCnc: 151 mg/dL
Hgb A1c MFr Bld: 6.9 % — ABNORMAL HIGH (ref 4.8–5.6)

## 2019-11-16 ENCOUNTER — Telehealth: Payer: Self-pay | Admitting: Family Medicine

## 2019-11-16 DIAGNOSIS — M546 Pain in thoracic spine: Secondary | ICD-10-CM | POA: Insufficient documentation

## 2019-11-16 NOTE — Telephone Encounter (Signed)
Pt's daughter/ Maudie Mercury called states she has been falling lately & was seen by her Cardiologist who suggested pt's PCP order a (liver function panel ).   -Forwarding message to medical asst for review w/provider.  --glh

## 2019-11-16 NOTE — Telephone Encounter (Signed)
Please review and advise. AS, CMA 

## 2019-11-17 NOTE — Telephone Encounter (Signed)
Spoke with Shirley Sanders and she is aware of the below. Call transferred to front desk for scheduling. AS, CMA

## 2019-11-17 NOTE — Telephone Encounter (Signed)
After looking through last OV note, this is something we did not discuss and we need to- as the potential etiologies go far beyond a liver panel.  Please have them make a f/up appt to discuss.  Ok to be telehealth as long as they are ok coming in for bldwrk sometime after the OV if deemed necessary.

## 2019-11-17 NOTE — Telephone Encounter (Signed)
Left message for Shirley Sanders to call our office back in regards to the below. AS, CMA

## 2019-11-23 ENCOUNTER — Ambulatory Visit (INDEPENDENT_AMBULATORY_CARE_PROVIDER_SITE_OTHER): Payer: Medicare Other | Admitting: Family Medicine

## 2019-11-23 ENCOUNTER — Other Ambulatory Visit: Payer: Self-pay

## 2019-11-23 ENCOUNTER — Encounter: Payer: Self-pay | Admitting: Family Medicine

## 2019-11-23 VITALS — BP 98/61 | Ht 60.0 in | Wt 98.3 lb

## 2019-11-23 DIAGNOSIS — M546 Pain in thoracic spine: Secondary | ICD-10-CM

## 2019-11-23 DIAGNOSIS — Z711 Person with feared health complaint in whom no diagnosis is made: Secondary | ICD-10-CM | POA: Diagnosis not present

## 2019-11-23 DIAGNOSIS — Z79899 Other long term (current) drug therapy: Secondary | ICD-10-CM | POA: Diagnosis not present

## 2019-11-23 DIAGNOSIS — Z5181 Encounter for therapeutic drug level monitoring: Secondary | ICD-10-CM | POA: Diagnosis not present

## 2019-11-23 DIAGNOSIS — R630 Anorexia: Secondary | ICD-10-CM | POA: Diagnosis not present

## 2019-11-23 DIAGNOSIS — T1490XA Injury, unspecified, initial encounter: Secondary | ICD-10-CM

## 2019-11-23 LAB — BASIC METABOLIC PANEL: Glucose: 85

## 2019-11-23 NOTE — Progress Notes (Signed)
Telehealth office visit note for Shirley Sanders, Shirley Sanders- at Primary Care at [facility]   I connected with current patient today and verified that I am speaking with the correct person using two identifiers.   . Location of the patient: Home . Location of the provider: Office Only the patient (+/- their family members at pt's discretion) and myself were participating in the encounter - This visit type was conducted due to national recommendations for restrictions regarding the COVID-19 Pandemic (e.g. social distancing) in an effort to limit this patient's exposure and mitigate transmission in our community.  This format is felt to be most appropriate for this patient at this time.   - No physical exam could be performed with this format, beyond that communicated to [place] by the patient/ family members as noted.   - Additionally my office staff/ schedulers discussed with the patient that there may be a monetary charge related to this service, depending on their medical insurance.   The patient expressed understanding, and agreed to proceed.       History of Present Illness: Fall   I, Shirley Sanders, am serving as Education administrator for [place] (one month prior) and R-Sided Thoracic Pain After Daughter notes that the patient fell about a month ago.  Per daughter, the patient tripped over her bathroom scale, and fell in her bathroom.  At the time, the patient thought she "just hurt her knee."  They note that the fall happened suddenly and the patient was jarred and "didn't think anything else about it."  Now, the patient complains of back pain.  Per daughter, this pain goes from her right rib "all the way around."  Patient says the pain is located "down below the T12 I had cemented" (and she is glad that the fall did not affect this area).  Notes "the pain is across from the ribcage, under there."  After this, the patient's daughter talked her into going to the orthopedist.   They obtained X-rays and have an MRI scheduled.  Per daughter, they are now worried about liver damage from the fall, since the patient has pain in her right side, and was told that she "looks yellow" today by the doctor.  Daughter says "yes, she's definitely yellow, and since the fall a month ago she's lost so much weight."  Daughter notes the patient "doesn't eat, she doesn't drink."  Since the fall, the patient has been eating even less than her normal.  Regarding this, the patient says "you know I don't eat.  I know I need to, but I just don't want anything."  The patient says "since I had that stroke, something happened to my system, and I'm just not as interested in eating."  Regarding meal replacement shakes, per the patient's daughter, she's allergic to milk, and the patient doesn't like the lactose-free versions.  Patient notes that even Lact-aid milk still hurts her stomach.  Since the fall, denies new abdominal pain or belly pain.  Denies new nausea or feeling sick on her stomach.  Denies new intolerances to certain foods.  Denies symptoms of this nature in general.  If the patient's daughter pushes on the patient's right upper stomach, the patient does not wince or cringe away.  "Thankfully no."  Patient denies SOB.  "I've had none of that."  The patient retains her gallbladder.  Notes she "had problems with [her gallbladder] a long time ago," but nothing since.  The  patient's daughter is mainly concerned about the patient's lack of eating.  Patient denies new dizziness or lightheadedness.  Patient notes she does not want to waste away or die, but doesn't want to go to a nutritionist either.  The patient says she is not the least bit depressed; "I'm too blessed to be stressed."     No flowsheet data found.  Depression screen [facility] 2/9 11/23/2019 09/10/2019 04/08/2019 08/16/2017 05/16/2016  Decreased Interest 0 0 0 0 0  Down, Depressed, Hopeless 0 0 0 0 0  PHQ - 2 Score 0 0 0 0 0    Altered sleeping 3 1 1  - -  Tired, decreased energy 0 0 0 - -  Change in appetite 2 1 2  - -  Feeling bad or failure about yourself  0 0 0 - -  Trouble concentrating 0 0 0 - -  Moving slowly or fidgety/restless 0 0 0 - -  Suicidal thoughts 0 0 0 - -  PHQ-9 Score 5 2 3  - -  Difficult doing work/chores Somewhat difficult Not difficult at all - - -      Impression and Recommendations:    1. Right-sided thoracic back pain, unspecified chronicity   2. Injury- h/o Fall 1 mo ago   3. Poor appetite - chronically poor esp since pain assoc with recent accidental fall   4. Feared complaint without diagnosis-  ?ably jaundice per family       Right-Sided Thoracic Back Pain - h/o fall one month ago Feared complaint without diagnosis - questionably jaundice per family - Reviewed patient's symptoms extensively during appointment. - Denies itching, nausea, vomiting.  - Discussed critical need for lab work near future to evaluate jaundice, liver enzymes, pancreatic enzymes, and gallbladder to rule out biliary stones, etc.  - Encouraged patient to obtain thoracic MRI as scheduled by specialist. - Per patient's daughter, specialist is going to call and set it up. - Discussed need to adequately evaluate the patient's abdomen.  - Will continue to monitor closely.   Poor Appetite - chronically poor esp since pain assoc with recent accidental fall  Discussed that the patient has had poor appetite for a while, and that while this is nothing new to her, this has worsened since the fall.  Extensively reviewed patient's lack of eating and the fact that the patient has lost twelve lbs since 09/10/2019.  Lengthy conversation was held with patient as well as her daughter Wynonia Hazard, emphasizing the critical need for the patient to consume adequate nutrition to help improve and maintain her quality of life.  - Discussed importance of supplementing the patient's nutrition by any means necessary, including  option of snack bars and meal-replacement shakes for assistance.  - For aid with eating adequate calories, offered referral to nutritionist today, but patient declines. - Discussed that [place] may come in to evaluate the patient if needed.  Patient declines. - Per patient and daughter, she will not agree to these referrals.  - Extensive health counseling provided and all questions answered. - Will continue to monitor.   Follow-Up - Discussed need for patient to return in near future (next week) for in-person evaluation. - Obtain labs with in-person visit two days later.   As part of my medical decision making, I reviewed the following data within the [place] History obtained from pt /family, CMA notes reviewed and incorporated if applicable, Labs reviewed, Radiograph/ tests reviewed if applicable and OV notes from prior OV's with me,  as well as other specialists she/he has seen since seeing me last, were all reviewed and used in my medical decision making process today.    - Additionally, discussion had with patient regarding our treatment plan, and their biases/concerns about that plan were used in my medical decision making today.    - The patient agreed with the plan and demonstrated an understanding of the instructions.   No barriers to understanding were identified.    - Red flag symptoms and signs discussed in detail.  Patient expressed understanding regarding what to do in case of emergency\ urgent symptoms.   - The patient was advised to call back or seek an in-person evaluation if the symptoms worsen or if the condition fails to improve as anticipated.   Return for labs near future, with in-person evaluation 2-3 days later after accidental fall.    Orders Placed This Encounter  Procedures  . Gamma GT  . Lactate dehydrogenase  . Protime-INR  . Bilirubin, fractionated(tot/dir/indir)  . Comprehensive metabolic panel  . CBC with Differential/Platelet      I provided 20+ minutes of non face-to-face time during this encounter.  Additional time was spent with charting and coordination of care before and after the actual visit commenced.   Note:  This note was prepared with assistance of Dragon voice recognition software. Occasional wrong-word or sound-a-like substitutions may have occurred due to the inherent limitations of voice recognition software.   This document serves as a record of services personally performed by Shirley Dance, DO. It was created on her behalf by Toni Amend, a trained medical scribe. The creation of this record is based on the scribe's personal observations and the provider's statements to them.   This case required medical decision making of at least moderate complexity. The above documentation from Toni Amend, medical scribe, has been reviewed by Marjory Sneddon, Shirley Sanders.         Patient Care Team    Relationship Specialty Notifications Start End  Shirley Dance, DO PCP - General Family Medicine  04/08/19   Milus Height, MD Referring Physician Ophthalmology  10/21/14   Rexene Agent, MD Attending Physician Nephrology  10/21/14   Gildardo Cranker, [place] Physician Internal Medicine  11/19/14   Eulogio Bear, MD Consulting Physician Ophthalmology  03/20/17   Garrel Ridgel, DPM Consulting Physician Podiatry  04/08/19   [place], [place]    04/08/19    Comment: Dr Joelyn Oms- for CKD  Ortho, [place]    04/08/19    Comment: dr Delorise Jackson or something      -Vitals obtained; medications/ allergies reconciled;  personal medical, social, Sx etc.histories were updated by CMA, reviewed by me and are reflected in chart   Patient Active Problem List   Diagnosis Date Noted  . Hyperlipidemia associated with type 2 diabetes mellitus ([place]) 04/08/2019  . Hypertension associated with diabetes ([place]) 03/17/2014  . Diabetes mellitus, type 2 ([place]) 03/17/2014  . Diabetes mellitus with diabetic nephropathy  ([place]) 04/06/2015  . Chronic kidney disease (CKD) stage G4/A1, severely decreased glomerular filtration rate (GFR) between 15-29 mL/min/1.73 square meter and albuminuria creatinine ratio less than 30 mg/g ([facility]) 10/22/2014  . H/O noncompliance with medical treatment, presenting hazards to health 04/08/2019  . Poor appetite - chronically poor esp since pain assoc with recent accidental fall 11/23/2019  . Injury- h/o Fall 1 mo ago 11/23/2019  . Feared complaint without diagnosis-  ?ably jaundice per family 11/23/2019  . Thoracic back pain 11/16/2019  . Establishing  care with new doctor, encounter for 04/08/2019  . Diabetes mellitus due to underlying condition with stage 3 chronic kidney disease, without long-term current use of insulin ([place]) 04/08/2019  . Cerebrovascular accident (CVA) ([place]) 04/08/2019  . Macular degeneration (senile) of retina/  legally blind 04/08/2019  . Family history of stroke or transient ischemic attack in mother died age 94; several other fam members also has CVAs 04/08/2019  . Legally blind 04/08/2019  . Compression fracture of thoracic vertebra ([facility]) 09/17/2018  . Hypoglycemia due to type 2 diabetes mellitus ([place]) 01/31/2018  . Chronic fatigue 01/31/2018  . Vertigo 01/31/2018  . Osteoporosis 07/08/2015  . History of CVA with residual deficit 10/22/2014  . Macular degeneration of both eyes 10/22/2014  . Anemia in chronic kidney disease 10/22/2014  . Hyperlipidemia 03/17/2014     Current Meds  Medication Sig  . aspirin EC 81 MG tablet Take 1 tablet by mouth daily.  . chlorpheniramine ([employer] ALLERGY RELIEF) 4 MG tablet Take 4 mg by mouth 2 (two) times daily as needed for allergies.  . chlorthalidone (HYGROTON) 25 MG tablet Take 12.5 mg by mouth daily. Filled by Nephro Doc  . cholecalciferol (VITAMIN D3) 25 MCG (1000 UT) tablet Take 1 tablet (1,000 Units total) by mouth daily.  . Continuous Blood Gluc Receiver (FREESTYLE LIBRE 14 DAY READER) DEVI 1 Device by Does not  apply route 3 (three) times daily. Dx: E11.649  . Continuous Blood Gluc Sensor (FREESTYLE LIBRE 14 DAY SENSOR) MISC 1 Cartridge by Does not apply route every 14 (fourteen) days. Dx: E11.649  . Ginkgo Biloba Extract (GINKGO BILOBA MEMORY ENHANCER) 60 MG CAPS Take 1 capsule by mouth daily.  Marland Kitchen lisinopril (ZESTRIL) 2.5 MG tablet Take 1 tablet (2.5 mg total) by mouth daily.  . Multiple Vitamins-Minerals (PRESERVISION AREDS) CAPS Take 1 capsule by mouth 2 (two) times daily.   . pioglitazone (ACTOS) 15 MG tablet Take 1 tablet (15 mg total) by mouth daily.  . potassium chloride SA (K-DUR,KLOR-CON) 20 MEQ tablet Take 20 mEq by mouth at bedtime.   . simvastatin (ZOCOR) 10 MG tablet Take 1 tablet (10 mg total) by mouth daily.     Allergies:  Allergies  Allergen Reactions  . Codeine Other (See Comments)    hbp  . Contrast Media [Iodinated Diagnostic Agents]   . Fish Allergy   . Milk-Related Compounds      ROS:  See above HPI for pertinent positives and negatives   Objective:   Blood pressure 98/61, height 5' (1.524 m), weight 98 lb 4.8 oz (44.6 kg).  (if some vitals are omitted, this means that patient was UNABLE to obtain them even though they were asked to get them prior to OV today.  They were asked to call us at their earliest convenience with these once obtained. )  General: A & O * 3; sounds in no acute distress; in usual state of health.  Skin: Pt confirms warm and dry extremities and pink fingertips HEENT: Pt confirms lips non-cyanotic Chest: Patient confirms normal chest excursion and movement Respiratory: speaking in full sentences, no conversational dyspnea; patient confirms no use of accessory muscles Psych: insight appears good, mood- appears full

## 2019-11-29 ENCOUNTER — Encounter (HOSPITAL_COMMUNITY): Payer: Self-pay | Admitting: Emergency Medicine

## 2019-11-29 ENCOUNTER — Emergency Department (HOSPITAL_COMMUNITY): Payer: Medicare Other

## 2019-11-29 ENCOUNTER — Other Ambulatory Visit: Payer: Self-pay

## 2019-11-29 ENCOUNTER — Inpatient Hospital Stay (HOSPITAL_COMMUNITY)
Admission: EM | Admit: 2019-11-29 | Discharge: 2019-12-11 | DRG: 435 | Disposition: A | Discharge status: Hospice | Payer: Medicare Other | Attending: Internal Medicine | Admitting: Internal Medicine

## 2019-11-29 DIAGNOSIS — C801 Malignant (primary) neoplasm, unspecified: Secondary | ICD-10-CM | POA: Diagnosis not present

## 2019-11-29 DIAGNOSIS — E1159 Type 2 diabetes mellitus with other circulatory complications: Secondary | ICD-10-CM | POA: Diagnosis present

## 2019-11-29 DIAGNOSIS — N200 Calculus of kidney: Secondary | ICD-10-CM | POA: Diagnosis not present

## 2019-11-29 DIAGNOSIS — I69954 Hemiplegia and hemiparesis following unspecified cerebrovascular disease affecting left non-dominant side: Secondary | ICD-10-CM

## 2019-11-29 DIAGNOSIS — E785 Hyperlipidemia, unspecified: Secondary | ICD-10-CM | POA: Diagnosis not present

## 2019-11-29 DIAGNOSIS — M810 Age-related osteoporosis without current pathological fracture: Secondary | ICD-10-CM | POA: Diagnosis present

## 2019-11-29 DIAGNOSIS — C799 Secondary malignant neoplasm of unspecified site: Secondary | ICD-10-CM | POA: Diagnosis present

## 2019-11-29 DIAGNOSIS — Z90710 Acquired absence of both cervix and uterus: Secondary | ICD-10-CM

## 2019-11-29 DIAGNOSIS — K831 Obstruction of bile duct: Secondary | ICD-10-CM | POA: Diagnosis not present

## 2019-11-29 DIAGNOSIS — R5383 Other fatigue: Secondary | ICD-10-CM | POA: Diagnosis not present

## 2019-11-29 DIAGNOSIS — Z8249 Family history of ischemic heart disease and other diseases of the circulatory system: Secondary | ICD-10-CM

## 2019-11-29 DIAGNOSIS — I6930 Unspecified sequelae of cerebral infarction: Secondary | ICD-10-CM

## 2019-11-29 DIAGNOSIS — Z7984 Long term (current) use of oral hypoglycemic drugs: Secondary | ICD-10-CM

## 2019-11-29 DIAGNOSIS — Z91041 Radiographic dye allergy status: Secondary | ICD-10-CM

## 2019-11-29 DIAGNOSIS — E1169 Type 2 diabetes mellitus with other specified complication: Secondary | ICD-10-CM | POA: Diagnosis not present

## 2019-11-29 DIAGNOSIS — Z515 Encounter for palliative care: Secondary | ICD-10-CM

## 2019-11-29 DIAGNOSIS — Z885 Allergy status to narcotic agent status: Secondary | ICD-10-CM

## 2019-11-29 DIAGNOSIS — E43 Unspecified severe protein-calorie malnutrition: Secondary | ICD-10-CM | POA: Insufficient documentation

## 2019-11-29 DIAGNOSIS — Z7189 Other specified counseling: Secondary | ICD-10-CM

## 2019-11-29 DIAGNOSIS — M48061 Spinal stenosis, lumbar region without neurogenic claudication: Secondary | ICD-10-CM | POA: Diagnosis present

## 2019-11-29 DIAGNOSIS — Z833 Family history of diabetes mellitus: Secondary | ICD-10-CM

## 2019-11-29 DIAGNOSIS — R918 Other nonspecific abnormal finding of lung field: Secondary | ICD-10-CM | POA: Diagnosis present

## 2019-11-29 DIAGNOSIS — M8588 Other specified disorders of bone density and structure, other site: Secondary | ICD-10-CM | POA: Diagnosis present

## 2019-11-29 DIAGNOSIS — Z66 Do not resuscitate: Secondary | ICD-10-CM | POA: Diagnosis not present

## 2019-11-29 DIAGNOSIS — R64 Cachexia: Secondary | ICD-10-CM | POA: Diagnosis not present

## 2019-11-29 DIAGNOSIS — Z20822 Contact with and (suspected) exposure to covid-19: Secondary | ICD-10-CM | POA: Diagnosis present

## 2019-11-29 DIAGNOSIS — R079 Chest pain, unspecified: Secondary | ICD-10-CM | POA: Diagnosis not present

## 2019-11-29 DIAGNOSIS — C787 Secondary malignant neoplasm of liver and intrahepatic bile duct: Principal | ICD-10-CM | POA: Diagnosis present

## 2019-11-29 DIAGNOSIS — W010XXA Fall on same level from slipping, tripping and stumbling without subsequent striking against object, initial encounter: Secondary | ICD-10-CM | POA: Diagnosis present

## 2019-11-29 DIAGNOSIS — M545 Low back pain: Secondary | ICD-10-CM | POA: Diagnosis not present

## 2019-11-29 DIAGNOSIS — F0390 Unspecified dementia without behavioral disturbance: Secondary | ICD-10-CM | POA: Diagnosis present

## 2019-11-29 DIAGNOSIS — Y848 Other medical procedures as the cause of abnormal reaction of the patient, or of later complication, without mention of misadventure at the time of the procedure: Secondary | ICD-10-CM | POA: Diagnosis not present

## 2019-11-29 DIAGNOSIS — E041 Nontoxic single thyroid nodule: Secondary | ICD-10-CM | POA: Diagnosis not present

## 2019-11-29 DIAGNOSIS — R1013 Epigastric pain: Secondary | ICD-10-CM

## 2019-11-29 DIAGNOSIS — E119 Type 2 diabetes mellitus without complications: Secondary | ICD-10-CM

## 2019-11-29 DIAGNOSIS — K859 Acute pancreatitis without necrosis or infection, unspecified: Secondary | ICD-10-CM | POA: Diagnosis not present

## 2019-11-29 DIAGNOSIS — D72829 Elevated white blood cell count, unspecified: Secondary | ICD-10-CM

## 2019-11-29 DIAGNOSIS — Z841 Family history of disorders of kidney and ureter: Secondary | ICD-10-CM

## 2019-11-29 DIAGNOSIS — H3530 Unspecified macular degeneration: Secondary | ICD-10-CM | POA: Diagnosis present

## 2019-11-29 DIAGNOSIS — K449 Diaphragmatic hernia without obstruction or gangrene: Secondary | ICD-10-CM | POA: Diagnosis not present

## 2019-11-29 DIAGNOSIS — K219 Gastro-esophageal reflux disease without esophagitis: Secondary | ICD-10-CM | POA: Diagnosis not present

## 2019-11-29 DIAGNOSIS — I152 Hypertension secondary to endocrine disorders: Secondary | ICD-10-CM | POA: Diagnosis not present

## 2019-11-29 DIAGNOSIS — Z79899 Other long term (current) drug therapy: Secondary | ICD-10-CM

## 2019-11-29 DIAGNOSIS — Z823 Family history of stroke: Secondary | ICD-10-CM

## 2019-11-29 DIAGNOSIS — Z7982 Long term (current) use of aspirin: Secondary | ICD-10-CM

## 2019-11-29 DIAGNOSIS — Z8701 Personal history of pneumonia (recurrent): Secondary | ICD-10-CM

## 2019-11-29 DIAGNOSIS — R748 Abnormal levels of other serum enzymes: Secondary | ICD-10-CM

## 2019-11-29 DIAGNOSIS — R17 Unspecified jaundice: Secondary | ICD-10-CM

## 2019-11-29 DIAGNOSIS — M549 Dorsalgia, unspecified: Secondary | ICD-10-CM

## 2019-11-29 DIAGNOSIS — Z681 Body mass index (BMI) 19 or less, adult: Secondary | ICD-10-CM

## 2019-11-29 LAB — CBC
HCT: 34.4 % — ABNORMAL LOW (ref 36.0–46.0)
Hemoglobin: 11.1 g/dL — ABNORMAL LOW (ref 12.0–15.0)
MCH: 26.1 pg (ref 26.0–34.0)
MCHC: 32.3 g/dL (ref 30.0–36.0)
MCV: 80.8 fL (ref 80.0–100.0)
Platelets: 412 10*3/uL — ABNORMAL HIGH (ref 150–400)
RBC: 4.26 MIL/uL (ref 3.87–5.11)
RDW: 18.3 % — ABNORMAL HIGH (ref 11.5–15.5)
WBC: 11.7 10*3/uL — ABNORMAL HIGH (ref 4.0–10.5)
nRBC: 0 % (ref 0.0–0.2)

## 2019-11-29 LAB — URINALYSIS, ROUTINE W REFLEX MICROSCOPIC
Glucose, UA: 100 mg/dL — AB
Ketones, ur: 40 mg/dL — AB
Nitrite: NEGATIVE
Protein, ur: 30 mg/dL — AB
Specific Gravity, Urine: 1.025 (ref 1.005–1.030)
pH: 5.5 (ref 5.0–8.0)

## 2019-11-29 LAB — BASIC METABOLIC PANEL
Anion gap: 19 — ABNORMAL HIGH (ref 5–15)
BUN: 21 mg/dL (ref 8–23)
CO2: 21 mmol/L — ABNORMAL LOW (ref 22–32)
Calcium: 8.9 mg/dL (ref 8.9–10.3)
Chloride: 92 mmol/L — ABNORMAL LOW (ref 98–111)
Creatinine, Ser: 1.07 mg/dL — ABNORMAL HIGH (ref 0.44–1.00)
GFR calc Af Amer: 56 mL/min — ABNORMAL LOW (ref >60)
GFR calc non Af Amer: 48 mL/min — ABNORMAL LOW (ref >60)
Glucose, Bld: 132 mg/dL — ABNORMAL HIGH (ref 70–99)
Potassium: 4.6 mmol/L (ref 3.5–5.1)
Sodium: 132 mmol/L — ABNORMAL LOW (ref 135–145)

## 2019-11-29 LAB — URINALYSIS, MICROSCOPIC (REFLEX)

## 2019-11-29 LAB — GLUCOSE, CAPILLARY: Glucose-Capillary: 118 mg/dL — ABNORMAL HIGH (ref 70–99)

## 2019-11-29 LAB — HEPATIC FUNCTION PANEL
ALT: 58 U/L — ABNORMAL HIGH (ref 0–44)
AST: 112 U/L — ABNORMAL HIGH (ref 15–41)
Albumin: 2.4 g/dL — ABNORMAL LOW (ref 3.5–5.0)
Alkaline Phosphatase: 372 U/L — ABNORMAL HIGH (ref 38–126)
Bilirubin, Direct: 7.8 mg/dL — ABNORMAL HIGH (ref 0.0–0.2)
Indirect Bilirubin: 5.5 mg/dL — ABNORMAL HIGH (ref 0.3–0.9)
Total Bilirubin: 13.3 mg/dL — ABNORMAL HIGH (ref 0.3–1.2)
Total Protein: 6.8 g/dL (ref 6.5–8.1)

## 2019-11-29 LAB — LIPASE, BLOOD: Lipase: 30 U/L (ref 11–51)

## 2019-11-29 MED ORDER — LACTATED RINGERS IV BOLUS: 500.0000 mL | Freq: Once | INTRAVENOUS | Status: DC

## 2019-11-29 MED ORDER — ONDANSETRON HCL 4 MG PO TABS: 4.0000 mg | ORAL_TABLET | Freq: Four times a day (QID) | ORAL | Status: DC | PRN

## 2019-11-29 MED ORDER — SIMVASTATIN 20 MG PO TABS: 10.0000 mg | ORAL_TABLET | Freq: Every day | ORAL | Status: DC

## 2019-11-29 MED ORDER — LACTATED RINGERS IV SOLN: INTRAVENOUS | Status: AC

## 2019-11-29 MED ORDER — INSULIN ASPART 100 UNIT/ML ~~LOC~~ SOLN
0.0000 [IU] | Freq: Three times a day (TID) | SUBCUTANEOUS | Status: DC
  Administered 2019-12-01 – 2019-12-02 (×3): 2 [IU] via SUBCUTANEOUS
  Administered 2019-12-02: 1 [IU] via SUBCUTANEOUS
  Administered 2019-12-04: 2 [IU] via SUBCUTANEOUS
  Administered 2019-12-05: 1 [IU] via SUBCUTANEOUS
  Administered 2019-12-05: 2 [IU] via SUBCUTANEOUS

## 2019-11-29 MED ORDER — LACTATED RINGERS IV BOLUS
1000.0000 mL | Freq: Once | INTRAVENOUS | Status: AC
  Administered 2019-11-29: 1000 mL via INTRAVENOUS

## 2019-11-29 MED ORDER — ONDANSETRON HCL 4 MG/2ML IJ SOLN
4.0000 mg | Freq: Four times a day (QID) | INTRAMUSCULAR | Status: DC | PRN
  Administered 2019-12-05: 4 mg via INTRAVENOUS
  Filled 2019-11-29: qty 2

## 2019-11-29 MED ORDER — ASPIRIN EC 81 MG PO TBEC: 81.0000 mg | DELAYED_RELEASE_TABLET | Freq: Every day | ORAL | Status: DC

## 2019-11-29 NOTE — H&P (Signed)
History and Physical    DETRICE CALES MGQ:[id number] DOB: Apr 05, 1935 DOA: 11/29/2019  PCP: Mellody Dance, DO  Patient coming from: Home  I have personally briefly reviewed patient's old medical records in [place]  Chief Complaint: Back pain, jaundice  HPI: Shirley Sanders is a 85 y.o. female with medical history significant for history of CVA, type 2 diabetes, hypertension, and hyperlipidemia who presents to the ED for evaluation of back pain and jaundice.  Patient reports 2-3 weeks of ongoing lower back pain with radiation across her left flank to the upper abdomen/epigastric region.  She reports poor oral intake which she says has been ongoing since her last stroke.  She has been told her skin has appeared yellow.  She had some nausea without emesis which has now resolved.  She reports chronic intermittent loose stools which is unchanged.  She has been feeling lethargic.  She denies any subjective fevers, chills, diaphoresis, chest pain, dyspnea, cough, or dysuria.  She was initially planned to have outpatient work-up with labs and imaging however family persuaded her to present to the ED today for evaluation.  ED Course:  Initial vitals showed BP 105/70, pulse 108, RR 16, temp 97.7 Fahrenheit, SPO2 100% on room air.  Labs are significant for total bilirubin 13.3 (direct 7.8, indirect 5.5), AST 112, ALT 58, alkaline phosphatase 372, WBC 11.7, hemoglobin 11.1, platelets 412,000, sodium 132, potassium 4.6, bicarb 21, BUN 21, creatinine 1.07, serum glucose 132.  SARS-CoV-2 PCR was obtained and pending.  2 view chest x-ray showed unchanged punctate calcified granuloma left upper lobe.  CT abdomen/pelvis without contrast showed multiple poorly defined hepatic lesions concerning for metastatic disease to the liver, multiple small pulmonary nodules throughout the visualized lung bases, mildly enlarged mesorectal lymph node, nonobstructive calculi in the right renal collecting  system, aortic atherosclerosis, moderate sized hiatal hernia.  CT L-spine showed osteopenia, previously augmented T12 compression fracture, widespread degenerative lumbar spinal stenosis, severe at L4-L5.  Patient was given 1 L LR.  EDP discussed the case with on-call oncology who suggested patient may need stent placement and biopsy and will see in the morning.  The hospitalist service was consulted admit for further evaluation and management.  Review of Systems: All systems reviewed and are negative except as documented in history of present illness above.   Past Medical History:  Diagnosis Date  . Bronchitis   . CVA (cerebral infarction)    x 4  . Diabetes mellitus without complication ([place])   . GERD (gastroesophageal reflux disease)   . History of hiatal hernia   . Hyperlipidemia   . Hypertension   . Multiple allergies   . Murmur   . Osteoporosis   . Pneumonia   . Renal disorder   . Shingles   . Shingles   . Stroke [facility])    left sided sensory and motor deficits    Past Surgical History:  Procedure Laterality Date  . ABDOMINAL HYSTERECTOMY    . CATARACT EXTRACTION Bilateral 02/23/2016, 03/22/2016  . CATARACT EXTRACTION W/PHACO Left 03/01/2016   Procedure: CATARACT EXTRACTION PHACO AND INTRAOCULAR LENS PLACEMENT (I[facility]);  Surgeon: Eulogio Bear, MD;  Location: [facility] ORS;  Service: Ophthalmology;  Laterality: Left;  [place] 1.09 AP% 14.7 CDE 10.13 FLUID BAG LOT # [id number]  . CATARACT EXTRACTION W/PHACO Right 03/22/2016   Procedure: CATARACT EXTRACTION PHACO AND INTRAOCULAR LENS PLACEMENT ([facility]);  Surgeon: Eulogio Bear, MD;  Location: [facility] ORS;  Service: Ophthalmology;  Laterality: Right;  Lot# [id number] [place]:  01:19.5 AP%: 20.8 CDE: 16.57   . kidney stones    . KNEE SURGERY Right 03/29/2016  . TONSILLECTOMY      Social History:  reports that she has never smoked. She has never used smokeless tobacco. She reports that she does not drink alcohol or use drugs.  Allergies    Allergen Reactions  . Fish Allergy Nausea And Vomiting and Other (See Comments)    SEVERE NAUSEA AND VOMITING THAT LASTS FOR DAYS  . Codeine Hypertension  . Contrast Media [Iodinated Diagnostic Agents] Hypertension  . Milk-Related Compounds Diarrhea  . Shellfish-Derived Products Nausea And Vomiting and Other (See Comments)    SEVERE NAUSEA AND VOMITING THAT LASTS FOR DAYS    Family History  Problem Relation Age of Onset  . Stroke Mother   . Hypertension Father   . Kidney disease Father   . Diabetes Father   . Cancer Father   . Diabetes Sister   . Cancer Sister        lung  . Stroke Sister   . Heart disease Brother   . Diabetes Brother   . Heart disease Brother   . Stroke Brother   . Diabetes Brother   . Stroke Brother      Prior to Admission medications   Medication Sig Start Date End Date Taking? Authorizing Provider  aspirin EC 81 MG tablet Take 1 tablet by mouth daily.    [provider]  chlorpheniramine ([employer] ALLERGY RELIEF) 4 MG tablet Take 4 mg by mouth 2 (two) times daily as needed for allergies.    [provider]  chlorthalidone (HYGROTON) 25 MG tablet Take 12.5 mg by mouth daily. Filled by Nephro Doc 02/05/19   [provider]  cholecalciferol (VITAMIN D3) 25 MCG (1000 UT) tablet Take 1 tablet (1,000 Units total) by mouth daily. 09/10/19   Mellody Dance, DO  Continuous Blood Gluc Receiver (FREESTYLE LIBRE 14 DAY READER) DEVI 1 Device by Does not apply route 3 (three) times daily. Dx: E11.649 02/05/18   Gildardo Cranker, DO  Continuous Blood Gluc Sensor (FREESTYLE LIBRE 14 DAY SENSOR) MISC 1 Cartridge by Does not apply route every 14 (fourteen) days. Dx: E11.649 04/08/19   Mellody Dance, DO  Ginkgo Biloba Extract (GINKGO BILOBA MEMORY ENHANCER) 60 MG CAPS Take 1 capsule by mouth daily.    [provider]  lisinopril (ZESTRIL) 2.5 MG tablet Take 1 tablet (2.5 mg total) by mouth daily. 09/10/19   Mellody Dance, DO  Multiple  Vitamins-Minerals (PRESERVISION AREDS) CAPS Take 1 capsule by mouth 2 (two) times daily.     [provider]  pioglitazone (ACTOS) 15 MG tablet Take 1 tablet (15 mg total) by mouth daily. 09/10/19   Opalski, Neoma Laming, DO  potassium chloride SA (K-DUR,KLOR-CON) 20 MEQ tablet Take 20 mEq by mouth at bedtime.     [provider]  simvastatin (ZOCOR) 10 MG tablet Take 1 tablet (10 mg total) by mouth daily. 09/10/19   Mellody Dance, DO    Physical Exam: Vitals:   11/29/19 1519 11/29/19 1830 11/29/19 1930 11/29/19 2000  BP:  118/70 123/82 125/67  Pulse: (!) 108 85 88 80  Resp:      Temp:      TempSrc:      SpO2: 100% 98% 96% 97%  Weight:      Height:       Constitutional: Elderly woman resting in bed, NAD, calm, comfortable Eyes: PERRL, scleral icterus present. ENMT: Mucous membranes are moist. Posterior pharynx  clear of any exudate or lesions. Neck: normal, supple, no masses. Respiratory: clear to auscultation bilaterally, no wheezing, no crackles. Normal respiratory effort. No accessory muscle use.  Cardiovascular: Regular rate and rhythm, no murmurs / rubs / gallops. No extremity edema. 2+ pedal pulses. Abdomen: no tenderness, no masses palpated. Bowel sounds positive.  Musculoskeletal: no clubbing / cyanosis. No joint deformity upper and lower extremities. Good ROM, no contractures. Normal muscle tone.  Skin: Jaundiced appearance Neurologic: CN 2-12 grossly intact. Sensation intact, Strength 5/5 in all 4.  Psychiatric: Normal judgment and insight. Alert and oriented x 3. Normal mood.   Labs on Admission: I have personally reviewed following labs and imaging studies  CBC: Recent Labs  Lab 11/29/19 1530  WBC 11.7*  HGB 11.1*  HCT 34.4*  MCV 80.8  PLT 696*   Basic Metabolic Panel: Recent Labs  Lab 11/29/19 1530  NA 132*  K 4.6  CL 92*  CO2 21*  GLUCOSE 132*  BUN 21  CREATININE 1.07*  CALCIUM 8.9   GFR: Estimated Creatinine Clearance: 28.5 mL/min  (A) (by C-G formula based on SCr of 1.07 mg/dL (H)). Liver Function Tests: Recent Labs  Lab 11/29/19 1530  AST 112*  ALT 58*  ALKPHOS 372*  BILITOT 13.3*  PROT 6.8  ALBUMIN 2.4*   Recent Labs  Lab 11/29/19 1530  LIPASE 30   No results for input(s): AMMONIA in the last 168 hours. Coagulation Profile: No results for input(s): INR, PROTIME in the last 168 hours. Cardiac Enzymes: No results for input(s): CKTOTAL, CKMB, CKMBINDEX, TROPONINI in the last 168 hours. BNP (last 3 results) No results for input(s): PROBNP in the last 8760 hours. HbA1C: No results for input(s): HGBA1C in the last 72 hours. CBG: No results for input(s): GLUCAP in the last 168 hours. Lipid Profile: No results for input(s): CHOL, HDL, LDLCALC, TRIG, CHOLHDL, LDLDIRECT in the last 72 hours. Thyroid Function Tests: No results for input(s): TSH, T4TOTAL, FREET4, T3FREE, THYROIDAB in the last 72 hours. Anemia Panel: No results for input(s): VITAMINB12, FOLATE, FERRITIN, TIBC, IRON, RETICCTPCT in the last 72 hours. Urine analysis:    Component Value Date/Time   COLORURINE STRAW (A) 12/24/2017 0827   APPEARANCEUR CLEAR 12/24/2017 0827   LABSPEC 1.013 12/24/2017 0827   PHURINE 6.0 12/24/2017 0827   GLUCOSEU 50 (A) 12/24/2017 0827   HGBUR NEGATIVE 12/24/2017 0827   BILIRUBINUR NEGATIVE 12/24/2017 0827   KETONESUR NEGATIVE 12/24/2017 0827   PROTEINUR NEGATIVE 12/24/2017 0827   UROBILINOGEN 0.2 03/17/2014 1319   NITRITE NEGATIVE 12/24/2017 0827   LEUKOCYTESUR NEGATIVE 12/24/2017 0827    Radiological Exams on Admission: CT ABDOMEN PELVIS WO CONTRAST  Result Date: 11/29/2019 CLINICAL DATA:  85 year old female with history of epigastric pain. EXAM: CT ABDOMEN AND PELVIS WITHOUT CONTRAST TECHNIQUE: Multidetector CT imaging of the abdomen and pelvis was performed following the standard protocol without IV contrast. COMPARISON:  No priors. FINDINGS: Lower chest: Multiple pulmonary nodules noted throughout the  visualized lung bases, largest of which is in the right lower lobe (axial image 8 of series 5) measuring 9 x 5 mm. Moderate-sized hiatal hernia. Aortic atherosclerosis. Atherosclerotic calcifications in the right coronary artery. Hepatobiliary: Multiple poorly defined low-attenuation lesions scattered throughout the liver, largest of which is in the right lobe of the liver measuring approximately 8.5 x 3.9 cm (axial image 26 of series 3), concerning for metastatic disease. Gallbladder is moderately distended with intermediate attenuation material within the lumen, suspicious for biliary sludge. No surrounding inflammatory changes. Pancreas: No  definite pancreatic mass or peripancreatic fluid collections or inflammatory changes are noted on today's noncontrast CT examination. Spleen: Unremarkable. Adrenals/Urinary Tract: Tiny nonobstructive calculi are noted within the right renal collecting system measuring up to 2 mm in the interpolar region. Unenhanced appearance of the kidneys and bilateral adrenal glands are otherwise unremarkable. No hydroureteronephrosis. Urinary bladder is normal in appearance. Bilateral adrenal glands are normal in appearance. Stomach/Bowel: Intra-abdominal portion of the stomach is normal in appearance. No pathologic dilatation of small bowel or colon. The appendix is not confidently identified and may be surgically absent. Regardless, there are no inflammatory changes noted adjacent to the cecum to suggest the presence of an acute appendicitis at this time. Vascular/Lymphatic: Aortic atherosclerosis. Mildly enlarged mesorectal lymph node on the left side (axial image 60 of series 3). No other lymphadenopathy noted elsewhere in the abdomen or pelvis. Reproductive: Status post hysterectomy. Ovaries are not confidently identified may be surgically absent or atrophic. Other: No significant volume of ascites.  No pneumoperitoneum. Musculoskeletal: Chronic compression fracture of T12 with post  vertebroplasty changes and 40% loss of anterior vertebral body height. There are no aggressive appearing lytic or blastic lesions noted in the visualized portions of the skeleton. IMPRESSION: 1. Multiple poorly defined hepatic lesions highly concerning for metastatic disease to the liver. These findings could be better evaluated with follow-up nonemergent abdominal MRI with and without IV gadolinium to clearly characterize these hepatic lesions. 2. Multiple small pulmonary nodules throughout the visualized lung bases also concerning for metastatic disease. 3. No definite primary malignancy is confidently identified. However, there is a mildly enlarged mesorectal lymph node measuring 1 cm in short axis. Further evaluation with nonemergent colonoscopy is suggested in the near future to better evaluate for the possibility of primary colorectal neoplasm. 4. Nonobstructive calculi in the right renal collecting system measuring up to 2 mm in the interpolar region. No ureteral stones or findings of urinary tract obstruction. 5. Aortic atherosclerosis, in addition to at least right coronary artery disease. 6. Moderate-sized hiatal hernia. 7. Additional incidental findings, as above. Electronically Signed   By: Vinnie Langton M.D.   On: 11/29/2019 17:53   DG Chest 2 View  Result Date: 11/29/2019 CLINICAL DATA:  [place] and chest pain.  No known injury. EXAM: CHEST - 2 VIEW COMPARISON:  PA and lateral chest 03/17/2014. FINDINGS: Punctate calcified granuloma left upper lobe is unchanged. Lungs otherwise clear. Heart size normal. No pneumothorax or pleural effusion. The patient is status post vertebral augmentation at T12 for a prior fracture. No acute bony abnormality. IMPRESSION: No acute disease. Electronically Signed   By: Inge Rise M.D.   On: 11/29/2019 15:57   CT L-SPINE NO CHARGE  Result Date: 11/29/2019 CLINICAL DATA:  85 year old female status post trip and fall about a month ago. Pain radiating to the  back. Jaundice. EXAM: CT LUMBAR SPINE WITHOUT CONTRAST TECHNIQUE: Technique: Multiplanar CT images of the lumbar spine were reconstructed from contemporary CT of the Abdomen and Pelvis. CONTRAST:  None COMPARISON:  Noncontrast CT Abdomen, and Pelvis today are reported separately. Report of [facility] lumbar spine MRI 09/05/2018 (no images available). FINDINGS: Segmentation: Normal. Alignment: Straightening of lumbar lordosis. No significant spondylolisthesis. Vertebrae: Osteopenia. Previously augmented T12 compression fracture. Bulky lower thoracic and lumbar endplate osteophytosis. Visible sacrum and SI joints appear intact. No acute or suspicious osseous lesion is identified. Paraspinal and other soft tissues: Negative lumbar paraspinal soft tissues. Abdominal and pelvic viscera reported separately today. Disc levels: Degenerative multifactorial spinal stenosis: Moderate at T12-L1,  moderate at L2-L3, moderate to severe at L3-L4, severe at L4-L5 (vacuum disc and severe disc space loss at this level). Widespread moderate lumbar neural foraminal stenosis bilateral L3 through L5 nerve levels. IMPRESSION: 1. Osteopenia. No acute osseous abnormality identified in the lumbar spine. Previously augmented T12 compression fracture. 2. See CT Abdomen and Pelvis today reported separately. 3. Widespread degenerative lumbar spinal stenosis, severe at L4-L5. 4. Aortic Atherosclerosis (ICD10-I70.0). Electronically Signed   By: Genevie Ann M.D.   On: 11/29/2019 18:39    EKG: Not performed.  Assessment/Plan Active Problems:   Hypertension associated with diabetes ([place])   Diabetes mellitus, type 2 ([place])   History of CVA with residual deficit   Hyperlipidemia associated with type 2 diabetes mellitus ([place])   Metastatic disease ([place])   Hyperbilirubinemia   Jaundice  Shirley Sanders is a 85 y.o. female with medical history significant for history of CVA, type 2 diabetes, hypertension, and hyperlipidemia who was admitted with suspected  metastatic disease of unknown primary associated with hyperbilirubinemia/jaundice.  Hyperbilirubinemia/jaundice in setting of suspected metastatic disease of unknown primary: CT imaging shows multiple hepatic lesions concerning for metastatic disease.  Multiple small pulmonary nodules throughout the lung bases also noted.  Enlarged mesorectal lymph node was seen. -Oncology consulted by EDP, to see in a.m. -Obtain abdominal ultrasound for further evaluation -Will likely need GI consultation in a.m. for potential biliary stenting and colonoscopy -Continue IV fluid hydration overnight  Type 2 diabetes: Holding Actos, continuing sensitive SSI.  History of CVA: Continue statin.  Holding home aspirin for now in case biopsy pursued.  Hypertension: Currently normotensive, holding home chlorthalidone or lisinopril.  Hyperlipidemia: Continue simvastatin.  DVT prophylaxis: SCDs Code Status: DNR, confirmed with patient Family Communication: Discussed with patient, she has discussed with family. Disposition Plan: From home, disposition pending further work-up/evaluation of suspected metastatic disease and oncology consultation. Consults called: Oncology consulted by EDP Admission status: Observation   Zada Finders MD [facility]  If 7PM-7AM, please contact night-coverage [email or web address]  11/29/2019, 8:31 PM

## 2019-11-29 NOTE — ED Notes (Signed)
Pt made aware of the need for urine.

## 2019-11-29 NOTE — ED Provider Notes (Signed)
[facility] EMERGENCY DEPARTMENT Provider Note   CSN: [id number] Arrival date & time: 11/29/19  1500     History Chief complaint: Back pain  Shirley Sanders is a 85 y.o. female.  The history is provided by the patient, a relative and medical records.  The patient is an 85 year old female with a past medical history of CVA, diabetes, hypertension, hyperlipidemia,, previous back surgery who presents to the ED for epigastric abdominal pain.  Patient reports the pain is stabbing, radiates from the epigastrium around the right side of the abdomen and through to the middle of her back.  Symptoms have been present for approximately 1 month, nothing triggered this, worse with movement, some mild improvement with tramadol, symptoms are moderate, constant, unchanged.  Has seen her spinal surgeon who is getting an MRI on March 1 of this year.  Patient is scheduled to follow-up with her primary care provider in the upcoming week.  Of note the patient has become jaundiced over the last week, she has also had decreased appetite and unintentional weight loss and fatigue.  No fevers, chills, did have one episode of nausea but no vomiting and no changes in her stools or urine.        Past Medical History:  Diagnosis Date   Bronchitis    CVA (cerebral infarction)    x 4   Diabetes mellitus without complication (HCC)    GERD (gastroesophageal reflux disease)    History of hiatal hernia    Hyperlipidemia    Hypertension    Multiple allergies    Murmur    Osteoporosis    Pneumonia    Renal disorder    Shingles    Shingles    Stroke [facility])    left sided sensory and motor deficits    Patient Active Problem List   Diagnosis Date Noted   Poor appetite - chronically poor esp since pain assoc with recent accidental fall 11/23/2019   Injury- h/o Fall 1 mo ago 11/23/2019   Feared complaint without diagnosis-  ?ably jaundice per family 11/23/2019   Thoracic back  pain 11/16/2019   Establishing care with new doctor, encounter for 04/08/2019   Hyperlipidemia associated with type 2 diabetes mellitus ([place]) 04/08/2019   H/O noncompliance with medical treatment, presenting hazards to health 04/08/2019   Diabetes mellitus due to underlying condition with stage 3 chronic kidney disease, without long-term current use of insulin ([place]) 04/08/2019   Cerebrovascular accident (CVA) ([place]) 04/08/2019   Macular degeneration (senile) of retina/  legally blind 04/08/2019   Family history of stroke or transient ischemic attack in mother died age 42; several other fam members also has CVAs 04/08/2019   Legally blind 04/08/2019   Compression fracture of thoracic vertebra ([place]) 09/17/2018   Hypoglycemia due to type 2 diabetes mellitus ([place]) 01/31/2018   Chronic fatigue 01/31/2018   Vertigo 01/31/2018   Osteoporosis 07/08/2015   Diabetes mellitus with diabetic nephropathy ([place]) 04/06/2015   History of CVA with residual deficit 10/22/2014   Chronic kidney disease (CKD) stage G4/A1, severely decreased glomerular filtration rate (GFR) between 15-29 mL/min/1.73 square meter and albuminuria creatinine ratio less than 30 mg/g ([place]) 10/22/2014   Macular degeneration of both eyes 10/22/2014   Anemia in chronic kidney disease 10/22/2014   Hyperlipidemia 03/17/2014   Hypertension associated with diabetes ([place]) 03/17/2014   Diabetes mellitus, type 2 ([place]) 03/17/2014    Past Surgical History:  Procedure Laterality Date   ABDOMINAL HYSTERECTOMY     CATARACT EXTRACTION  Bilateral 02/23/2016, 03/22/2016   CATARACT EXTRACTION W/PHACO Left 03/01/2016   Procedure: CATARACT EXTRACTION PHACO AND INTRAOCULAR LENS PLACEMENT (IOC);  Surgeon: Eulogio Bear, MD;  Location: [facility] ORS;  Service: Ophthalmology;  Laterality: Left;  [place] 1.09 AP% 14.7 CDE 10.13 FLUID BAG LOT # [id number]   CATARACT EXTRACTION W/PHACO Right 03/22/2016   Procedure: CATARACT EXTRACTION PHACO  AND INTRAOCULAR LENS PLACEMENT (IOC);  Surgeon: Eulogio Bear, MD;  Location: [facility] ORS;  Service: Ophthalmology;  Laterality: Right;  Lot# [id number] H [place]: 01:19.5 AP%: 20.8 CDE: 16.57    kidney stones     KNEE SURGERY Right 03/29/2016   TONSILLECTOMY       OB History   No obstetric history on file.     Family History  Problem Relation Age of Onset   Stroke Mother    Hypertension Father    Kidney disease Father    Diabetes Father    Cancer Father    Diabetes Sister    Cancer Sister        lung   Stroke Sister    Heart disease Brother    Diabetes Brother    Heart disease Brother    Stroke Brother    Diabetes Brother    Stroke Brother     Social History   Tobacco Use   Smoking status: Never Smoker   Smokeless tobacco: Never Used  Substance Use Topics   Alcohol use: No   Drug use: No    Home Medications Prior to Admission medications   Medication Sig Start Date End Date Taking? Authorizing Provider  aspirin EC 81 MG tablet Take 1 tablet by mouth daily.    [provider]  chlorpheniramine ([employer] ALLERGY RELIEF) 4 MG tablet Take 4 mg by mouth 2 (two) times daily as needed for allergies.    [provider]  chlorthalidone (HYGROTON) 25 MG tablet Take 12.5 mg by mouth daily. Filled by Nephro Doc 02/05/19   [provider]  cholecalciferol (VITAMIN D3) 25 MCG (1000 UT) tablet Take 1 tablet (1,000 Units total) by mouth daily. 09/10/19   Mellody Dance, DO  Continuous Blood Gluc Receiver (FREESTYLE LIBRE 14 DAY READER) DEVI 1 Device by Does not apply route 3 (three) times daily. Dx: E11.649 02/05/18   Gildardo Cranker, DO  Continuous Blood Gluc Sensor (FREESTYLE LIBRE 14 DAY SENSOR) MISC 1 Cartridge by Does not apply route every 14 (fourteen) days. Dx: E11.649 04/08/19   Mellody Dance, DO  Ginkgo Biloba Extract (GINKGO BILOBA MEMORY ENHANCER) 60 MG CAPS Take 1 capsule by mouth daily.    [provider]  lisinopril  (ZESTRIL) 2.5 MG tablet Take 1 tablet (2.5 mg total) by mouth daily. 09/10/19   Mellody Dance, DO  Multiple Vitamins-Minerals (PRESERVISION AREDS) CAPS Take 1 capsule by mouth 2 (two) times daily.     [provider]  pioglitazone (ACTOS) 15 MG tablet Take 1 tablet (15 mg total) by mouth daily. 09/10/19   Opalski, Neoma Laming, DO  potassium chloride SA (K-DUR,KLOR-CON) 20 MEQ tablet Take 20 mEq by mouth at bedtime.     [provider]  simvastatin (ZOCOR) 10 MG tablet Take 1 tablet (10 mg total) by mouth daily. 09/10/19   Mellody Dance, DO    Allergies    Codeine, Contrast media [iodinated diagnostic agents], Fish allergy, and Milk-related compounds  Review of Systems   Review of Systems  Constitutional: Positive for appetite change and fatigue. Negative for chills and fever.  Respiratory: Negative for cough and shortness  of breath.   Cardiovascular: Negative for chest pain.  Gastrointestinal: Positive for abdominal pain and nausea. Negative for blood in stool, constipation, diarrhea and vomiting.  Genitourinary: Negative for dysuria and frequency.  Musculoskeletal: Positive for back pain.  All other systems reviewed and are negative.   Physical Exam Updated Vital Signs BP 105/70 (BP Location: Left Arm)    Pulse (!) 108    Temp 97.7 F (36.5 C) (Oral)    Resp 16    Ht 5' (1.524 m)    Wt 44.6 kg    SpO2 100%    BMI 19.20 kg/m   Physical Exam Vitals and nursing note reviewed.  Constitutional:      General: She is not in acute distress.    Appearance: She is well-developed.  HENT:     Head: Normocephalic and atraumatic.     Mouth/Throat:     Mouth: Mucous membranes are moist.     Pharynx: Oropharynx is clear.  Eyes:     General: Scleral icterus present.     Extraocular Movements: Extraocular movements intact.     Pupils: Pupils are equal, round, and reactive to light.  Cardiovascular:     Rate and Rhythm: Normal rate and regular rhythm.     Pulses: Normal  pulses.     Heart sounds: No murmur.  Pulmonary:     Effort: Pulmonary effort is normal. No respiratory distress.     Breath sounds: Normal breath sounds.  Abdominal:     General: There is no distension.     Palpations: Abdomen is soft.     Tenderness: There is abdominal tenderness (epigastric and RUQ). There is no guarding or rebound.  Musculoskeletal:     Cervical back: Neck supple.     Right lower leg: No edema.  Lymphadenopathy:     Cervical: No cervical adenopathy.  Skin:    General: Skin is warm and dry.     Coloration: Skin is jaundiced.  Neurological:     General: No focal deficit present.     Mental Status: She is alert.     ED Results / Procedures / Treatments   Labs (all labs ordered are listed, but only abnormal results are displayed) Labs Reviewed  CBC - Abnormal; Notable for the following components:      Result Value   WBC 11.7 (*)    Hemoglobin 11.1 (*)    HCT 34.4 (*)    RDW 18.3 (*)    Platelets 412 (*)    All other components within normal limits  BASIC METABOLIC PANEL - Abnormal; Notable for the following components:   Sodium 132 (*)    Chloride 92 (*)    CO2 21 (*)    Glucose, Bld 132 (*)    Creatinine, Ser 1.07 (*)    GFR calc non Af Amer 48 (*)    GFR calc Af Amer 56 (*)    Anion gap 19 (*)    All other components within normal limits  HEPATIC FUNCTION PANEL - Abnormal; Notable for the following components:   Albumin 2.4 (*)    AST 112 (*)    ALT 58 (*)    Alkaline Phosphatase 372 (*)    Total Bilirubin 13.3 (*)    Bilirubin, Direct 7.8 (*)    Indirect Bilirubin 5.5 (*)    All other components within normal limits  LIPASE, BLOOD  URINALYSIS, ROUTINE W REFLEX MICROSCOPIC    EKG None  Radiology CT ABDOMEN PELVIS WO CONTRAST  Result Date: 11/29/2019 CLINICAL DATA:  85 year old female with history of epigastric pain. EXAM: CT ABDOMEN AND PELVIS WITHOUT CONTRAST TECHNIQUE: Multidetector CT imaging of the abdomen and pelvis was  performed following the standard protocol without IV contrast. COMPARISON:  No priors. FINDINGS: Lower chest: Multiple pulmonary nodules noted throughout the visualized lung bases, largest of which is in the right lower lobe (axial image 8 of series 5) measuring 9 x 5 mm. Moderate-sized hiatal hernia. Aortic atherosclerosis. Atherosclerotic calcifications in the right coronary artery. Hepatobiliary: Multiple poorly defined low-attenuation lesions scattered throughout the liver, largest of which is in the right lobe of the liver measuring approximately 8.5 x 3.9 cm (axial image 26 of series 3), concerning for metastatic disease. Gallbladder is moderately distended with intermediate attenuation material within the lumen, suspicious for biliary sludge. No surrounding inflammatory changes. Pancreas: No definite pancreatic mass or peripancreatic fluid collections or inflammatory changes are noted on today's noncontrast CT examination. Spleen: Unremarkable. Adrenals/Urinary Tract: Tiny nonobstructive calculi are noted within the right renal collecting system measuring up to 2 mm in the interpolar region. Unenhanced appearance of the kidneys and bilateral adrenal glands are otherwise unremarkable. No hydroureteronephrosis. Urinary bladder is normal in appearance. Bilateral adrenal glands are normal in appearance. Stomach/Bowel: Intra-abdominal portion of the stomach is normal in appearance. No pathologic dilatation of small bowel or colon. The appendix is not confidently identified and may be surgically absent. Regardless, there are no inflammatory changes noted adjacent to the cecum to suggest the presence of an acute appendicitis at this time. Vascular/Lymphatic: Aortic atherosclerosis. Mildly enlarged mesorectal lymph node on the left side (axial image 60 of series 3). No other lymphadenopathy noted elsewhere in the abdomen or pelvis. Reproductive: Status post hysterectomy. Ovaries are not confidently identified may be  surgically absent or atrophic. Other: No significant volume of ascites.  No pneumoperitoneum. Musculoskeletal: Chronic compression fracture of T12 with post vertebroplasty changes and 40% loss of anterior vertebral body height. There are no aggressive appearing lytic or blastic lesions noted in the visualized portions of the skeleton. IMPRESSION: 1. Multiple poorly defined hepatic lesions highly concerning for metastatic disease to the liver. These findings could be better evaluated with follow-up nonemergent abdominal MRI with and without IV gadolinium to clearly characterize these hepatic lesions. 2. Multiple small pulmonary nodules throughout the visualized lung bases also concerning for metastatic disease. 3. No definite primary malignancy is confidently identified. However, there is a mildly enlarged mesorectal lymph node measuring 1 cm in short axis. Further evaluation with nonemergent colonoscopy is suggested in the near future to better evaluate for the possibility of primary colorectal neoplasm. 4. Nonobstructive calculi in the right renal collecting system measuring up to 2 mm in the interpolar region. No ureteral stones or findings of urinary tract obstruction. 5. Aortic atherosclerosis, in addition to at least right coronary artery disease. 6. Moderate-sized hiatal hernia. 7. Additional incidental findings, as above. Electronically Signed   By: Vinnie Langton M.D.   On: 11/29/2019 17:53   DG Chest 2 View  Result Date: 11/29/2019 CLINICAL DATA:  Ribcage and chest pain.  No known injury. EXAM: CHEST - 2 VIEW COMPARISON:  PA and lateral chest 03/17/2014. FINDINGS: Punctate calcified granuloma left upper lobe is unchanged. Lungs otherwise clear. Heart size normal. No pneumothorax or pleural effusion. The patient is status post vertebral augmentation at T12 for a prior fracture. No acute bony abnormality. IMPRESSION: No acute disease. Electronically Signed   By: Inge Rise M.D.   On: 11/29/2019  15:57  CT L-SPINE NO CHARGE  Result Date: 11/29/2019 CLINICAL DATA:  85 year old female status post trip and fall about a month ago. Pain radiating to the back. Jaundice. EXAM: CT LUMBAR SPINE WITHOUT CONTRAST TECHNIQUE: Technique: Multiplanar CT images of the lumbar spine were reconstructed from contemporary CT of the Abdomen and Pelvis. CONTRAST:  None COMPARISON:  Noncontrast CT Abdomen, and Pelvis today are reported separately. Report of [facility] lumbar spine MRI 09/05/2018 (no images available). FINDINGS: Segmentation: Normal. Alignment: Straightening of lumbar lordosis. No significant spondylolisthesis. Vertebrae: Osteopenia. Previously augmented T12 compression fracture. Bulky lower thoracic and lumbar endplate osteophytosis. Visible sacrum and SI joints appear intact. No acute or suspicious osseous lesion is identified. Paraspinal and other soft tissues: Negative lumbar paraspinal soft tissues. Abdominal and pelvic viscera reported separately today. Disc levels: Degenerative multifactorial spinal stenosis: Moderate at T12-L1, moderate at L2-L3, moderate to severe at L3-L4, severe at L4-L5 (vacuum disc and severe disc space loss at this level). Widespread moderate lumbar neural foraminal stenosis bilateral L3 through L5 nerve levels. IMPRESSION: 1. Osteopenia. No acute osseous abnormality identified in the lumbar spine. Previously augmented T12 compression fracture. 2. See CT Abdomen and Pelvis today reported separately. 3. Widespread degenerative lumbar spinal stenosis, severe at L4-L5. 4. Aortic Atherosclerosis (ICD10-I70.0). Electronically Signed   By: Genevie Ann M.D.   On: 11/29/2019 18:39    Procedures Procedures (including critical care time)  Medications Ordered in ED Medications  lactated ringers bolus 500 mL (has no administration in time range)    ED Course  I have reviewed the triage vital signs and the nursing notes.  Pertinent labs & imaging results that were available during my care  of the patient were reviewed by me and considered in my medical decision making (see chart for details).    MDM Rules/Calculators/A&P                      85 year old female here for epigastric abdominal pain radiating to the back as well as jaundice.  Patient has had a weight loss and decreased appetite.  CT with evidence of metastatic disease to the liver and lungs.  Malignancy is the likely cause of her pain.  Discussed with oncology who recommends admission for biliary stenting and biopsy.  Patient to be admitted to hospital medicine for further evaluation.  Patient declining pain medications at this time.  Results discussed with patient and her daughter and all questions answered, they agree with admission to the hospital for further work-up.  Admitted in stable condition.  Final Clinical Impression(s) / ED Diagnoses Final diagnoses:  Back pain    Rx / DC Orders ED Discharge Orders    None       Alichia Alridge, [place], MD 11/30/19 [id number]    Elnora Morrison, MD 11/30/19 [id number]

## 2019-11-29 NOTE — ED Triage Notes (Signed)
Patient presents with daughter c/o right sided rib cage pain radiating into back, jaundice and dehydrated. States back pain has been going on for a few weeks. About a month ago tripped over bathroom scale and landed on left knee. Back pain has progrossively worsened.  Patient states she has MRI scheduled on March 1st with [facility] in [place]. Daughter states patient began having jaundice skin for the past couple weeks and has appt with PCP this week.

## 2019-11-29 NOTE — ED Notes (Signed)
Report attempted 

## 2019-11-30 ENCOUNTER — Observation Stay (HOSPITAL_COMMUNITY): Payer: Medicare Other

## 2019-11-30 ENCOUNTER — Encounter: Payer: Self-pay | Admitting: Family Medicine

## 2019-11-30 ENCOUNTER — Encounter (HOSPITAL_COMMUNITY): Payer: Self-pay | Admitting: Internal Medicine

## 2019-11-30 DIAGNOSIS — R160 Hepatomegaly, not elsewhere classified: Secondary | ICD-10-CM | POA: Diagnosis not present

## 2019-11-30 DIAGNOSIS — E041 Nontoxic single thyroid nodule: Secondary | ICD-10-CM | POA: Diagnosis not present

## 2019-11-30 DIAGNOSIS — M810 Age-related osteoporosis without current pathological fracture: Secondary | ICD-10-CM | POA: Diagnosis present

## 2019-11-30 DIAGNOSIS — C229 Malignant neoplasm of liver, not specified as primary or secondary: Secondary | ICD-10-CM | POA: Diagnosis not present

## 2019-11-30 DIAGNOSIS — I69954 Hemiplegia and hemiparesis following unspecified cerebrovascular disease affecting left non-dominant side: Secondary | ICD-10-CM | POA: Diagnosis not present

## 2019-11-30 DIAGNOSIS — R64 Cachexia: Secondary | ICD-10-CM | POA: Diagnosis present

## 2019-11-30 DIAGNOSIS — M2550 Pain in unspecified joint: Secondary | ICD-10-CM | POA: Diagnosis not present

## 2019-11-30 DIAGNOSIS — C787 Secondary malignant neoplasm of liver and intrahepatic bile duct: Secondary | ICD-10-CM | POA: Diagnosis not present

## 2019-11-30 DIAGNOSIS — R933 Abnormal findings on diagnostic imaging of other parts of digestive tract: Secondary | ICD-10-CM | POA: Diagnosis not present

## 2019-11-30 DIAGNOSIS — R194 Change in bowel habit: Secondary | ICD-10-CM | POA: Diagnosis not present

## 2019-11-30 DIAGNOSIS — Z515 Encounter for palliative care: Secondary | ICD-10-CM | POA: Diagnosis not present

## 2019-11-30 DIAGNOSIS — R634 Abnormal weight loss: Secondary | ICD-10-CM | POA: Diagnosis not present

## 2019-11-30 DIAGNOSIS — F0390 Unspecified dementia without behavioral disturbance: Secondary | ICD-10-CM | POA: Diagnosis present

## 2019-11-30 DIAGNOSIS — E1169 Type 2 diabetes mellitus with other specified complication: Secondary | ICD-10-CM | POA: Diagnosis not present

## 2019-11-30 DIAGNOSIS — I129 Hypertensive chronic kidney disease with stage 1 through stage 4 chronic kidney disease, or unspecified chronic kidney disease: Secondary | ICD-10-CM | POA: Diagnosis not present

## 2019-11-30 DIAGNOSIS — R1013 Epigastric pain: Secondary | ICD-10-CM | POA: Diagnosis not present

## 2019-11-30 DIAGNOSIS — W010XXA Fall on same level from slipping, tripping and stumbling without subsequent striking against object, initial encounter: Secondary | ICD-10-CM | POA: Diagnosis present

## 2019-11-30 DIAGNOSIS — R918 Other nonspecific abnormal finding of lung field: Secondary | ICD-10-CM | POA: Diagnosis not present

## 2019-11-30 DIAGNOSIS — Z7189 Other specified counseling: Secondary | ICD-10-CM | POA: Diagnosis not present

## 2019-11-30 DIAGNOSIS — N183 Chronic kidney disease, stage 3 unspecified: Secondary | ICD-10-CM | POA: Diagnosis not present

## 2019-11-30 DIAGNOSIS — Z66 Do not resuscitate: Secondary | ICD-10-CM | POA: Diagnosis not present

## 2019-11-30 DIAGNOSIS — R410 Disorientation, unspecified: Secondary | ICD-10-CM | POA: Diagnosis not present

## 2019-11-30 DIAGNOSIS — C799 Secondary malignant neoplasm of unspecified site: Secondary | ICD-10-CM | POA: Diagnosis not present

## 2019-11-30 DIAGNOSIS — K859 Acute pancreatitis without necrosis or infection, unspecified: Secondary | ICD-10-CM | POA: Diagnosis not present

## 2019-11-30 DIAGNOSIS — Z8249 Family history of ischemic heart disease and other diseases of the circulatory system: Secondary | ICD-10-CM | POA: Diagnosis not present

## 2019-11-30 DIAGNOSIS — Y848 Other medical procedures as the cause of abnormal reaction of the patient, or of later complication, without mention of misadventure at the time of the procedure: Secondary | ICD-10-CM | POA: Diagnosis not present

## 2019-11-30 DIAGNOSIS — R5381 Other malaise: Secondary | ICD-10-CM | POA: Diagnosis not present

## 2019-11-30 DIAGNOSIS — I6930 Unspecified sequelae of cerebral infarction: Secondary | ICD-10-CM | POA: Diagnosis not present

## 2019-11-30 DIAGNOSIS — E1122 Type 2 diabetes mellitus with diabetic chronic kidney disease: Secondary | ICD-10-CM | POA: Diagnosis not present

## 2019-11-30 DIAGNOSIS — C801 Malignant (primary) neoplasm, unspecified: Secondary | ICD-10-CM | POA: Diagnosis not present

## 2019-11-30 DIAGNOSIS — R17 Unspecified jaundice: Secondary | ICD-10-CM

## 2019-11-30 DIAGNOSIS — E1159 Type 2 diabetes mellitus with other circulatory complications: Secondary | ICD-10-CM | POA: Diagnosis not present

## 2019-11-30 DIAGNOSIS — N200 Calculus of kidney: Secondary | ICD-10-CM | POA: Diagnosis not present

## 2019-11-30 DIAGNOSIS — Z823 Family history of stroke: Secondary | ICD-10-CM | POA: Diagnosis not present

## 2019-11-30 DIAGNOSIS — K219 Gastro-esophageal reflux disease without esophagitis: Secondary | ICD-10-CM | POA: Diagnosis present

## 2019-11-30 DIAGNOSIS — Z681 Body mass index (BMI) 19 or less, adult: Secondary | ICD-10-CM | POA: Diagnosis not present

## 2019-11-30 DIAGNOSIS — Z7401 Bed confinement status: Secondary | ICD-10-CM | POA: Diagnosis not present

## 2019-11-30 DIAGNOSIS — H3530 Unspecified macular degeneration: Secondary | ICD-10-CM | POA: Diagnosis present

## 2019-11-30 DIAGNOSIS — M48061 Spinal stenosis, lumbar region without neurogenic claudication: Secondary | ICD-10-CM | POA: Diagnosis present

## 2019-11-30 DIAGNOSIS — M8588 Other specified disorders of bone density and structure, other site: Secondary | ICD-10-CM | POA: Diagnosis present

## 2019-11-30 DIAGNOSIS — Z20822 Contact with and (suspected) exposure to covid-19: Secondary | ICD-10-CM | POA: Diagnosis not present

## 2019-11-30 DIAGNOSIS — K831 Obstruction of bile duct: Secondary | ICD-10-CM | POA: Diagnosis not present

## 2019-11-30 DIAGNOSIS — E1121 Type 2 diabetes mellitus with diabetic nephropathy: Secondary | ICD-10-CM | POA: Diagnosis not present

## 2019-11-30 DIAGNOSIS — E049 Nontoxic goiter, unspecified: Secondary | ICD-10-CM | POA: Diagnosis not present

## 2019-11-30 DIAGNOSIS — R748 Abnormal levels of other serum enzymes: Secondary | ICD-10-CM | POA: Diagnosis not present

## 2019-11-30 DIAGNOSIS — I152 Hypertension secondary to endocrine disorders: Secondary | ICD-10-CM | POA: Diagnosis present

## 2019-11-30 DIAGNOSIS — E43 Unspecified severe protein-calorie malnutrition: Secondary | ICD-10-CM | POA: Diagnosis not present

## 2019-11-30 DIAGNOSIS — E785 Hyperlipidemia, unspecified: Secondary | ICD-10-CM | POA: Diagnosis not present

## 2019-11-30 DIAGNOSIS — K838 Other specified diseases of biliary tract: Secondary | ICD-10-CM | POA: Diagnosis not present

## 2019-11-30 LAB — CBC
HCT: 27.5 % — ABNORMAL LOW (ref 36.0–46.0)
Hemoglobin: 8.6 g/dL — ABNORMAL LOW (ref 12.0–15.0)
MCH: 25.4 pg — ABNORMAL LOW (ref 26.0–34.0)
MCHC: 31.3 g/dL (ref 30.0–36.0)
MCV: 81.1 fL (ref 80.0–100.0)
Platelets: 284 10*3/uL (ref 150–400)
RBC: 3.39 MIL/uL — ABNORMAL LOW (ref 3.87–5.11)
RDW: 18 % — ABNORMAL HIGH (ref 11.5–15.5)
WBC: 8.9 10*3/uL (ref 4.0–10.5)
nRBC: 0 % (ref 0.0–0.2)

## 2019-11-30 LAB — COMPREHENSIVE METABOLIC PANEL
ALT: 44 U/L (ref 0–44)
AST: 80 U/L — ABNORMAL HIGH (ref 15–41)
Albumin: 1.9 g/dL — ABNORMAL LOW (ref 3.5–5.0)
Alkaline Phosphatase: 275 U/L — ABNORMAL HIGH (ref 38–126)
Anion gap: 12 (ref 5–15)
BUN: 18 mg/dL (ref 8–23)
CO2: 25 mmol/L (ref 22–32)
Calcium: 8.4 mg/dL — ABNORMAL LOW (ref 8.9–10.3)
Chloride: 94 mmol/L — ABNORMAL LOW (ref 98–111)
Creatinine, Ser: 0.92 mg/dL (ref 0.44–1.00)
GFR calc Af Amer: >60 mL/min (ref >60)
GFR calc non Af Amer: 58 mL/min — ABNORMAL LOW (ref >60)
Glucose, Bld: 98 mg/dL (ref 70–99)
Potassium: 4.3 mmol/L (ref 3.5–5.1)
Sodium: 131 mmol/L — ABNORMAL LOW (ref 135–145)
Total Bilirubin: 10.1 mg/dL — ABNORMAL HIGH (ref 0.3–1.2)
Total Protein: 5.2 g/dL — ABNORMAL LOW (ref 6.5–8.1)

## 2019-11-30 LAB — GLUCOSE, CAPILLARY
Glucose-Capillary: 87 mg/dL (ref 70–99)
Glucose-Capillary: 102 mg/dL — ABNORMAL HIGH (ref 70–99)
Glucose-Capillary: 138 mg/dL — ABNORMAL HIGH (ref 70–99)
Glucose-Capillary: 89 mg/dL (ref 70–99)

## 2019-11-30 LAB — SARS CORONAVIRUS 2 (TAT 6-24 HRS): SARS Coronavirus 2: NEGATIVE

## 2019-11-30 MED ORDER — IBUPROFEN 100 MG PO CHEW
200.0000 mg | CHEWABLE_TABLET | Freq: Three times a day (TID) | ORAL | Status: DC | PRN
  Administered 2019-11-30 – 2019-12-05 (×3): 200 mg via ORAL
  Filled 2019-11-30 (×4): qty 2

## 2019-11-30 NOTE — Consult Note (Signed)
[place]  Telephone:([phone]) [phone] Fax:(336) [phone]   MEDICAL ONCOLOGY - INITIAL CONSULTATION  Referral MD: Dr. Shelly Coss  Reason for Referral: Liver lesions concerning for metastatic disease, small pulmonary nodules  HPI: Shirley Sanders is an 85 year old female with a past medical history significant for history of CVA x4, type 2 diabetes, osteoporosis, hypertension, and hyperlipidemia.  The patient presented to the emergency room with back pain and jaundice.  She reported 2 to 3-week history of lower back pain with radiation across her left flank to the upper abdomen/epigastric region.  Admission lab work was significant for a total bilirubin of 13.3 (direct bilirubin 7.8 and indirect bilirubin 5.5), AST 112, ALT 58, and alkaline phosphatase 372, WBC 11.7, hemoglobin 11.1.  A CT of the abdomen pelvis without contrast was taken which showed multiple poorly defined hepatic lesions concerning for metastatic disease to the liver and multiple small pulmonary nodules throughout the visualized lung bases.  There was no definite primary malignancy identified, but there was no large mesorectal lymph node which measured 1 cm.  An ultrasound of the abdomen was performed earlier today which was negative for gallstones.  There was intrahepatic and extrahepatic biliary dilatation.  No obstructing mass was seen but multiple liver lesions compatible metastatic disease were noted.  The patient's daughter was at the bedside at the time my visit today.  She reports ongoing low back pain with radiation to her flank and epigastric area.  She reports anorexia and a weight loss of approximately 15 pounds over the past month.  She is also reporting loose stools and her daughter notes that the stool from earlier today was red/burgundy in color.  The patient denies headaches and dizziness.  Reports poor vision secondary to macular degeneration.  No recent fever or chills.  Denies chest discomfort or shortness  of breath.  She is not having any nausea or vomiting.  Denies lower extremity edema.  No epistaxis, hemoptysis, hematemesis, hematuria noted.  The patient is from [place], [place].  She lives by herself.  She has a son who lives in [place] and a daughter who lives in [place].  Denies history of alcohol and tobacco use.  Medical oncology was asked see the patient to make recommendations regarding her liver lesions and pulmonary nodules.    Past Medical History:  Diagnosis Date  . Bronchitis   . CVA (cerebral infarction)    x 4  . Diabetes mellitus without complication ([place])   . GERD (gastroesophageal reflux disease)   . History of hiatal hernia   . Hyperlipidemia   . Hypertension   . Multiple allergies   . Murmur   . Osteoporosis   . Pneumonia   . Renal disorder   . Shingles   . Shingles   . Stroke [facility])    left sided sensory and motor deficits  :  Past Surgical History:  Procedure Laterality Date  . ABDOMINAL HYSTERECTOMY    . CATARACT EXTRACTION Bilateral 02/23/2016, 03/22/2016  . CATARACT EXTRACTION W/PHACO Left 03/01/2016   Procedure: CATARACT EXTRACTION PHACO AND INTRAOCULAR LENS PLACEMENT (IOC);  Surgeon: Eulogio Bear, MD;  Location: [facility] ORS;  Service: Ophthalmology;  Laterality: Left;  [place] 1.09 AP% 14.7 CDE 10.13 FLUID BAG LOT # [id number]  . CATARACT EXTRACTION W/PHACO Right 03/22/2016   Procedure: CATARACT EXTRACTION PHACO AND INTRAOCULAR LENS PLACEMENT ([place]);  Surgeon: Eulogio Bear, MD;  Location: [facility] ORS;  Service: Ophthalmology;  Laterality: Right;  Lot# [id number] H [place]: 01:19.5 AP%: 20.8 CDE: 16.57   .  kidney stones    . KNEE SURGERY Right 03/29/2016  . TONSILLECTOMY    :  Current Facility-Administered Medications  Medication Dose Route Frequency Provider Last Rate Last Admin  . insulin aspart (novoLOG) injection 0-9 Units  0-9 Units Subcutaneous TID WC Patel, Vishal R, MD      . ondansetron (ZOFRAN) tablet 4 mg  4 mg Oral Q6H PRN Lenore Cordia, MD       Or  . ondansetron (ZOFRAN) injection 4 mg  4 mg Intravenous Q6H PRN Zada Finders R, MD      . simvastatin (ZOCOR) tablet 10 mg  10 mg Oral QHS Lenore Cordia, MD         Allergies  Allergen Reactions  . Fish Allergy Nausea And Vomiting and Other (See Comments)    SEVERE NAUSEA AND VOMITING THAT LASTS FOR DAYS  . Codeine Hypertension  . Contrast Media [Iodinated Diagnostic Agents] Hypertension  . Milk-Related Compounds Diarrhea  . Shellfish-Derived Products Nausea And Vomiting and Other (See Comments)    SEVERE NAUSEA AND VOMITING THAT LASTS FOR DAYS  :  Family History  Problem Relation Age of Onset  . Stroke Mother   . Hypertension Father   . Kidney disease Father   . Diabetes Father   . Cancer Father   . Diabetes Sister   . Cancer Sister        lung  . Stroke Sister   . Heart disease Brother   . Diabetes Brother   . Heart disease Brother   . Stroke Brother   . Diabetes Brother   . Stroke Brother   :  Social History   Socioeconomic History  . Marital status: Divorced    Spouse name: Not on file  . Number of children: 2  . Years of education: 11th  . Highest education level: Not on file  Occupational History  . Occupation: Advertising copywriter  Tobacco Use  . Smoking status: Never Smoker  . Smokeless tobacco: Never Used  Substance and Sexual Activity  . Alcohol use: No  . Drug use: No  . Sexual activity: Never  Other Topics Concern  . Not on file  Social History Narrative   Patient lives at home alone, one stories, no pets   Patient right handed   Patient drinks coffee and diet coke   Past profession- Office   Social Determinants of Health   Financial Resource Strain:   . Difficulty of Paying Living Expenses: Not on file  Food Insecurity:   . Worried About Charity fundraiser in the Last Year: Not on file  . Ran Out of Food in the Last Year: Not on file  Transportation Needs:   . Lack of Transportation (Medical): Not on file  . Lack  of Transportation (Non-Medical): Not on file  Physical Activity:   . Days of Exercise per Week: Not on file  . Minutes of Exercise per Session: Not on file  Stress:   . Feeling of Stress : Not on file  Social Connections:   . Frequency of Communication with Friends and Family: Not on file  . Frequency of Social Gatherings with Friends and Family: Not on file  . Attends Religious Services: Not on file  . Active Member of Clubs or Organizations: Not on file  . Attends Archivist Meetings: Not on file  . Marital Status: Not on file  Intimate Partner Violence:   . Fear of Current or Ex-Partner: Not on file  .  Emotionally Abused: Not on file  . Physically Abused: Not on file  . Sexually Abused: Not on file  :  Review of Systems: A comprehensive 14 point review of systems was negative except as noted in the HPI.  Exam: Patient Vitals for the past 24 hrs:  BP Temp Temp src Pulse Resp SpO2 Height Weight  11/30/19 0903 121/62 98.3 F (36.8 C) Oral 83 18 91 % -- --  11/30/19 0425 105/69 97.9 F (36.6 C) Oral 81 18 99 % -- --  11/29/19 2222 129/69 -- -- 78 18 -- 5' (1.524 m) 98 lb (44.5 kg)  11/29/19 2033 129/69 98.4 F (36.9 C) Oral 78 18 99 % -- --  11/29/19 2000 125/67 -- -- 80 -- 97 % -- --  11/29/19 1930 123/82 -- -- 88 -- 96 % -- --  11/29/19 1830 118/70 -- -- 85 -- 98 % -- --  11/29/19 1519 -- -- -- (!) 108 -- 100 % -- --  11/29/19 1518 -- -- -- -- -- -- 5' (1.524 m) 98 lb 4.8 oz (44.6 kg)  11/29/19 1510 105/70 97.7 F (36.5 C) Oral (!) 111 16 100 % -- --    General: Elderly female, well-nourished in no acute distress   Eyes: Scleral icterus noted ENT:  There were no oropharyngeal lesions.   Neck was without thyromegaly.   Lymphatics:  Negative cervical, supraclavicular or axillary adenopathy.   Respiratory: lungs were clear bilaterally without wheezing or crackles.   Cardiovascular:  Regular rate and rhythm, S1/S2, without murmur, rub or gallop.  There was no  pedal edema.   GI:  abdomen was soft, flat, nontender, nondistended, without organomegaly.   Musculoskeletal: No clubbing, no joint deformity in the upper or lower extremity.  Good range of motion. Skin exam was without echymosis, petichae.   Neuro exam was nonfocal. Patient was alert and oriented.  Attention was good.   Language was appropriate.  Mood was normal without depression.  Speech was not pressured.  Thought content was not tangential.     Lab Results  Component Value Date   WBC 8.9 11/30/2019   HGB 8.6 (L) 11/30/2019   HCT 27.5 (L) 11/30/2019   PLT 284 11/30/2019   GLUCOSE 98 11/30/2019   CHOL 157 05/05/2019   TRIG 81 05/05/2019   HDL 58 05/05/2019   LDLCALC 83 05/05/2019   ALT 44 11/30/2019   AST 80 (H) 11/30/2019   NA 131 (L) 11/30/2019   K 4.3 11/30/2019   CL 94 (L) 11/30/2019   CREATININE 0.92 11/30/2019   BUN 18 11/30/2019   CO2 25 11/30/2019    CT ABDOMEN PELVIS WO CONTRAST  Result Date: 11/29/2019 CLINICAL DATA:  85 year old female with history of epigastric pain. EXAM: CT ABDOMEN AND PELVIS WITHOUT CONTRAST TECHNIQUE: Multidetector CT imaging of the abdomen and pelvis was performed following the standard protocol without IV contrast. COMPARISON:  No priors. FINDINGS: Lower chest: Multiple pulmonary nodules noted throughout the visualized lung bases, largest of which is in the right lower lobe (axial image 8 of series 5) measuring 9 x 5 mm. Moderate-sized hiatal hernia. Aortic atherosclerosis. Atherosclerotic calcifications in the right coronary artery. Hepatobiliary: Multiple poorly defined low-attenuation lesions scattered throughout the liver, largest of which is in the right lobe of the liver measuring approximately 8.5 x 3.9 cm (axial image 26 of series 3), concerning for metastatic disease. Gallbladder is moderately distended with intermediate attenuation material within the lumen, suspicious for biliary sludge. No surrounding inflammatory changes.  Pancreas: No  definite pancreatic mass or peripancreatic fluid collections or inflammatory changes are noted on today's noncontrast CT examination. Spleen: Unremarkable. Adrenals/Urinary Tract: Tiny nonobstructive calculi are noted within the right renal collecting system measuring up to 2 mm in the interpolar region. Unenhanced appearance of the kidneys and bilateral adrenal glands are otherwise unremarkable. No hydroureteronephrosis. Urinary bladder is normal in appearance. Bilateral adrenal glands are normal in appearance. Stomach/Bowel: Intra-abdominal portion of the stomach is normal in appearance. No pathologic dilatation of small bowel or colon. The appendix is not confidently identified and may be surgically absent. Regardless, there are no inflammatory changes noted adjacent to the cecum to suggest the presence of an acute appendicitis at this time. Vascular/Lymphatic: Aortic atherosclerosis. Mildly enlarged mesorectal lymph node on the left side (axial image 60 of series 3). No other lymphadenopathy noted elsewhere in the abdomen or pelvis. Reproductive: Status post hysterectomy. Ovaries are not confidently identified may be surgically absent or atrophic. Other: No significant volume of ascites.  No pneumoperitoneum. Musculoskeletal: Chronic compression fracture of T12 with post vertebroplasty changes and 40% loss of anterior vertebral body height. There are no aggressive appearing lytic or blastic lesions noted in the visualized portions of the skeleton. IMPRESSION: 1. Multiple poorly defined hepatic lesions highly concerning for metastatic disease to the liver. These findings could be better evaluated with follow-up nonemergent abdominal MRI with and without IV gadolinium to clearly characterize these hepatic lesions. 2. Multiple small pulmonary nodules throughout the visualized lung bases also concerning for metastatic disease. 3. No definite primary malignancy is confidently identified. However, there is a mildly  enlarged mesorectal lymph node measuring 1 cm in short axis. Further evaluation with nonemergent colonoscopy is suggested in the near future to better evaluate for the possibility of primary colorectal neoplasm. 4. Nonobstructive calculi in the right renal collecting system measuring up to 2 mm in the interpolar region. No ureteral stones or findings of urinary tract obstruction. 5. Aortic atherosclerosis, in addition to at least right coronary artery disease. 6. Moderate-sized hiatal hernia. 7. Additional incidental findings, as above. Electronically Signed   By: Vinnie Langton M.D.   On: 11/29/2019 17:53   DG Chest 2 View  Result Date: 11/29/2019 CLINICAL DATA:  Ribcage and chest pain.  No known injury. EXAM: CHEST - 2 VIEW COMPARISON:  PA and lateral chest 03/17/2014. FINDINGS: Punctate calcified granuloma left upper lobe is unchanged. Lungs otherwise clear. Heart size normal. No pneumothorax or pleural effusion. The patient is status post vertebral augmentation at T12 for a prior fracture. No acute bony abnormality. IMPRESSION: No acute disease. Electronically Signed   By: Inge Rise M.D.   On: 11/29/2019 15:57   US Abdomen Complete  Result Date: 11/30/2019 CLINICAL DATA:  Epigastric pain and jaundice.  Hyperbilirubinemia. EXAM: ABDOMEN ULTRASOUND COMPLETE COMPARISON:  CT abdomen pelvis 11/29/2019 FINDINGS: Gallbladder: Gallbladder well distended without stones or gallbladder wall thickening. Negative sonographic Murphy sign. Mild gallbladder sludge. Common bile duct: Diameter: 13.3 mm. In addition, there is intrahepatic biliary dilatation. Liver: Multiple hypoechoic ill-defined masses throughout the liver as noted on CT. Largest mass in the left lobe measures 4 x 5 cm. Multiple additional masses in the right lobe. Portal vein is patent on color Doppler imaging with normal direction of blood flow towards the liver. IVC: No abnormality visualized. Pancreas: No mass identified in the pancreas.  Spleen: Size and appearance within normal limits. Right Kidney: Length: 8.6 cm. Echogenicity within normal limits. No mass or hydronephrosis visualized. Left Kidney: Length: 7.5  cm. Echogenicity within normal limits. No mass or hydronephrosis visualized. Abdominal aorta: No aneurysm visualized. Other findings: None. IMPRESSION: Negative for gallstones Intrahepatic and extrahepatic biliary dilatation. Common bile duct 13.3 mm. No obstructing mass lesion is seen however neoplasm is likely given the multiple liver lesions compatible with metastatic disease. No definite pancreatic mass on CT. Electronically Signed   By: Franchot Gallo M.D.   On: 11/30/2019 08:09   CT L-SPINE NO CHARGE  Result Date: 11/29/2019 CLINICAL DATA:  85 year old female status post trip and fall about a month ago. Pain radiating to the back. Jaundice. EXAM: CT LUMBAR SPINE WITHOUT CONTRAST TECHNIQUE: Technique: Multiplanar CT images of the lumbar spine were reconstructed from contemporary CT of the Abdomen and Pelvis. CONTRAST:  None COMPARISON:  Noncontrast CT Abdomen, and Pelvis today are reported separately. Report of [facility] lumbar spine MRI 09/05/2018 (no images available). FINDINGS: Segmentation: Normal. Alignment: Straightening of lumbar lordosis. No significant spondylolisthesis. Vertebrae: Osteopenia. Previously augmented T12 compression fracture. Bulky lower thoracic and lumbar endplate osteophytosis. Visible sacrum and SI joints appear intact. No acute or suspicious osseous lesion is identified. Paraspinal and other soft tissues: Negative lumbar paraspinal soft tissues. Abdominal and pelvic viscera reported separately today. Disc levels: Degenerative multifactorial spinal stenosis: Moderate at T12-L1, moderate at L2-L3, moderate to severe at L3-L4, severe at L4-L5 (vacuum disc and severe disc space loss at this level). Widespread moderate lumbar neural foraminal stenosis bilateral L3 through L5 nerve levels. IMPRESSION: 1. Osteopenia.  No acute osseous abnormality identified in the lumbar spine. Previously augmented T12 compression fracture. 2. See CT Abdomen and Pelvis today reported separately. 3. Widespread degenerative lumbar spinal stenosis, severe at L4-L5. 4. Aortic Atherosclerosis (ICD10-I70.0). Electronically Signed   By: Genevie Ann M.D.   On: 11/29/2019 18:39     CT ABDOMEN PELVIS WO CONTRAST  Result Date: 11/29/2019 CLINICAL DATA:  85 year old female with history of epigastric pain. EXAM: CT ABDOMEN AND PELVIS WITHOUT CONTRAST TECHNIQUE: Multidetector CT imaging of the abdomen and pelvis was performed following the standard protocol without IV contrast. COMPARISON:  No priors. FINDINGS: Lower chest: Multiple pulmonary nodules noted throughout the visualized lung bases, largest of which is in the right lower lobe (axial image 8 of series 5) measuring 9 x 5 mm. Moderate-sized hiatal hernia. Aortic atherosclerosis. Atherosclerotic calcifications in the right coronary artery. Hepatobiliary: Multiple poorly defined low-attenuation lesions scattered throughout the liver, largest of which is in the right lobe of the liver measuring approximately 8.5 x 3.9 cm (axial image 26 of series 3), concerning for metastatic disease. Gallbladder is moderately distended with intermediate attenuation material within the lumen, suspicious for biliary sludge. No surrounding inflammatory changes. Pancreas: No definite pancreatic mass or peripancreatic fluid collections or inflammatory changes are noted on today's noncontrast CT examination. Spleen: Unremarkable. Adrenals/Urinary Tract: Tiny nonobstructive calculi are noted within the right renal collecting system measuring up to 2 mm in the interpolar region. Unenhanced appearance of the kidneys and bilateral adrenal glands are otherwise unremarkable. No hydroureteronephrosis. Urinary bladder is normal in appearance. Bilateral adrenal glands are normal in appearance. Stomach/Bowel: Intra-abdominal portion  of the stomach is normal in appearance. No pathologic dilatation of small bowel or colon. The appendix is not confidently identified and may be surgically absent. Regardless, there are no inflammatory changes noted adjacent to the cecum to suggest the presence of an acute appendicitis at this time. Vascular/Lymphatic: Aortic atherosclerosis. Mildly enlarged mesorectal lymph node on the left side (axial image 60 of series 3). No other lymphadenopathy noted elsewhere  in the abdomen or pelvis. Reproductive: Status post hysterectomy. Ovaries are not confidently identified may be surgically absent or atrophic. Other: No significant volume of ascites.  No pneumoperitoneum. Musculoskeletal: Chronic compression fracture of T12 with post vertebroplasty changes and 40% loss of anterior vertebral body height. There are no aggressive appearing lytic or blastic lesions noted in the visualized portions of the skeleton. IMPRESSION: 1. Multiple poorly defined hepatic lesions highly concerning for metastatic disease to the liver. These findings could be better evaluated with follow-up nonemergent abdominal MRI with and without IV gadolinium to clearly characterize these hepatic lesions. 2. Multiple small pulmonary nodules throughout the visualized lung bases also concerning for metastatic disease. 3. No definite primary malignancy is confidently identified. However, there is a mildly enlarged mesorectal lymph node measuring 1 cm in short axis. Further evaluation with nonemergent colonoscopy is suggested in the near future to better evaluate for the possibility of primary colorectal neoplasm. 4. Nonobstructive calculi in the right renal collecting system measuring up to 2 mm in the interpolar region. No ureteral stones or findings of urinary tract obstruction. 5. Aortic atherosclerosis, in addition to at least right coronary artery disease. 6. Moderate-sized hiatal hernia. 7. Additional incidental findings, as above. Electronically  Signed   By: Vinnie Langton M.D.   On: 11/29/2019 17:53   DG Chest 2 View  Result Date: 11/29/2019 CLINICAL DATA:  Ribcage and chest pain.  No known injury. EXAM: CHEST - 2 VIEW COMPARISON:  PA and lateral chest 03/17/2014. FINDINGS: Punctate calcified granuloma left upper lobe is unchanged. Lungs otherwise clear. Heart size normal. No pneumothorax or pleural effusion. The patient is status post vertebral augmentation at T12 for a prior fracture. No acute bony abnormality. IMPRESSION: No acute disease. Electronically Signed   By: Inge Rise M.D.   On: 11/29/2019 15:57   US Abdomen Complete  Result Date: 11/30/2019 CLINICAL DATA:  Epigastric pain and jaundice.  Hyperbilirubinemia. EXAM: ABDOMEN ULTRASOUND COMPLETE COMPARISON:  CT abdomen pelvis 11/29/2019 FINDINGS: Gallbladder: Gallbladder well distended without stones or gallbladder wall thickening. Negative sonographic Murphy sign. Mild gallbladder sludge. Common bile duct: Diameter: 13.3 mm. In addition, there is intrahepatic biliary dilatation. Liver: Multiple hypoechoic ill-defined masses throughout the liver as noted on CT. Largest mass in the left lobe measures 4 x 5 cm. Multiple additional masses in the right lobe. Portal vein is patent on color Doppler imaging with normal direction of blood flow towards the liver. IVC: No abnormality visualized. Pancreas: No mass identified in the pancreas. Spleen: Size and appearance within normal limits. Right Kidney: Length: 8.6 cm. Echogenicity within normal limits. No mass or hydronephrosis visualized. Left Kidney: Length: 7.5 cm. Echogenicity within normal limits. No mass or hydronephrosis visualized. Abdominal aorta: No aneurysm visualized. Other findings: None. IMPRESSION: Negative for gallstones Intrahepatic and extrahepatic biliary dilatation. Common bile duct 13.3 mm. No obstructing mass lesion is seen however neoplasm is likely given the multiple liver lesions compatible with metastatic disease.  No definite pancreatic mass on CT. Electronically Signed   By: Franchot Gallo M.D.   On: 11/30/2019 08:09   CT L-SPINE NO CHARGE  Result Date: 11/29/2019 CLINICAL DATA:  85 year old female status post trip and fall about a month ago. Pain radiating to the back. Jaundice. EXAM: CT LUMBAR SPINE WITHOUT CONTRAST TECHNIQUE: Technique: Multiplanar CT images of the lumbar spine were reconstructed from contemporary CT of the Abdomen and Pelvis. CONTRAST:  None COMPARISON:  Noncontrast CT Abdomen, and Pelvis today are reported separately. Report of [facility] lumbar spine  MRI 09/05/2018 (no images available). FINDINGS: Segmentation: Normal. Alignment: Straightening of lumbar lordosis. No significant spondylolisthesis. Vertebrae: Osteopenia. Previously augmented T12 compression fracture. Bulky lower thoracic and lumbar endplate osteophytosis. Visible sacrum and SI joints appear intact. No acute or suspicious osseous lesion is identified. Paraspinal and other soft tissues: Negative lumbar paraspinal soft tissues. Abdominal and pelvic viscera reported separately today. Disc levels: Degenerative multifactorial spinal stenosis: Moderate at T12-L1, moderate at L2-L3, moderate to severe at L3-L4, severe at L4-L5 (vacuum disc and severe disc space loss at this level). Widespread moderate lumbar neural foraminal stenosis bilateral L3 through L5 nerve levels. IMPRESSION: 1. Osteopenia. No acute osseous abnormality identified in the lumbar spine. Previously augmented T12 compression fracture. 2. See CT Abdomen and Pelvis today reported separately. 3. Widespread degenerative lumbar spinal stenosis, severe at L4-L5. 4. Aortic Atherosclerosis (ICD10-I70.0). Electronically Signed   By: Genevie Ann M.D.   On: 11/29/2019 18:39   Assessment and Plan:  This is a pleasant 85 year old female who presents with back pain that radiates to her flank and epigastric area.  CT of the abdomen pelvis showed liver lesions and small pulmonary nodules at the  base of the lungs.  No definitive primary site was noted.  She has never had a colonoscopy.  She has significantly elevated total bilirubin.  Findings are suspicious for a GI malignancy.  #Liver lesions, small pulmonary nodules, elevated total bilirubin --Discussed with the patient and her daughter that findings are concerning for a GI malignancy. --Recommend GI consult for stenting and possible biopsy. --Recommend IR consult for ultrasound-guided biopsy of one of her liver lesions. --Recommend CT of the chest to complete her staging work-up. --Would also recommend obtaining a CEA and a CA 19.9 with the next lab draw. --Once we have the results of the biopsy, we can have a more detailed discussion with the patient and her family regarding her diagnosis, prognosis, and treatment options. --Would also recommend a PT/OT consult due to deconditioning.  Thank you for this referral.   Mikey Bussing, DNP, AGPCNP-BC, AOCNP

## 2019-11-30 NOTE — Consult Note (Signed)
UNASSIGNED PATIENT Reason for Consult: Obstructive jaundice. Referring Physician: THP  Shirley Sanders is an 85 y.o. female.  HPI: Shirley Sanders is a 85 year old white female with multiple medical problems listed below who presented to the [facility] emergency room severe right upper quadrant pain radiating to her back, abnormal weight loss, change in bowel habits and jaundice that was noticed by her family recently.  According to her daughter who is at the bedside, patient's had worsening abdominal pain with severe diarrhea for the last 6 to 8 weeks and a 15 pound weight loss during this period of time.  She denies having any melena or hematochezia.  She has had some right upper quadrant pain that is worsened over the last few days.  She had a knee injury when she slipped on her weighing machine in her bathroom was seen by an orthopedic surgeon who told her she appeared jaundiced but no work-up was undertaken at that time.  No labs were done as per the patient and her daughter.  In the emergency room she was noted to have a total bilirubin of 13.3 with elevated liver enzymes alkaline phosphatase of 372. CT scan of the abdomen pelvis without contrast showed poorly defined lesions in the liver concerning for metastatic disease and multiple nodules throughout the lung bases with a mildly enlarged mesorectal lymph node and nonobstructive calculi in the right collecting system and a moderate sized hiatal hernia; CT of the spine showed widespread degenerative disc disease with spinal stenosis.. Abdominal ultrasound showed dilated intra and extrahepatic biliary ducts along with a dilated CBD.  Past Medical History:  Diagnosis Date  . Bronchitis   . CVA (cerebral infarction)    x 4  . Diabetes mellitus without complication ([place])   . GERD (gastroesophageal reflux disease)   . History of hiatal hernia   . Hyperlipidemia   . Hypertension   . Multiple allergies   . Murmur   . Osteoporosis   . Pneumonia    . Renal disorder   . Shingles   . Shingles   . Stroke [facility])    left sided sensory and motor deficits   Past Surgical History:  Procedure Laterality Date  . ABDOMINAL HYSTERECTOMY    . CATARACT EXTRACTION Bilateral 02/23/2016, 03/22/2016  . CATARACT EXTRACTION W/PHACO Left 03/01/2016   Procedure: CATARACT EXTRACTION PHACO AND INTRAOCULAR LENS PLACEMENT (IOC);  Surgeon: Eulogio Bear, MD;  Location: [facility] ORS;  Service: Ophthalmology;  Laterality: Left;  [place] 1.09 AP% 14.7 CDE 10.13 FLUID BAG LOT # [id number]  . CATARACT EXTRACTION W/PHACO Right 03/22/2016   Procedure: CATARACT EXTRACTION PHACO AND INTRAOCULAR LENS PLACEMENT ([place]);  Surgeon: Eulogio Bear, MD;  Location: [facility] ORS;  Service: Ophthalmology;  Laterality: Right;  Lot# [id number] H [place]: 01:19.5 AP%: 20.8 CDE: 16.57   . kidney stones    . KNEE SURGERY Right 03/29/2016  . TONSILLECTOMY      Family History  Problem Relation Age of Onset  . Stroke Mother   . Hypertension Father   . Kidney disease Father   . Diabetes Father   . Cancer Father   . Diabetes Sister   . Cancer Sister        lung  . Stroke Sister   . Heart disease Brother   . Diabetes Brother   . Heart disease Brother   . Stroke Brother   . Diabetes Brother   . Stroke Brother     Social History:  reports that she has never smoked.  She has never used smokeless tobacco. She reports that she does not drink alcohol or use drugs.  Allergies:  Allergies  Allergen Reactions  . Fish Allergy Nausea And Vomiting and Other (See Comments)    SEVERE NAUSEA AND VOMITING THAT LASTS FOR DAYS  . Codeine Hypertension  . Contrast Media [Iodinated Diagnostic Agents] Hypertension  . Milk-Related Compounds Diarrhea  . Shellfish-Derived Products Nausea And Vomiting and Other (See Comments)    SEVERE NAUSEA AND VOMITING THAT LASTS FOR DAYS   Medications: I have reviewed the patient's current medications.  Results for orders placed or performed during the hospital  encounter of 11/29/19 (from the past 48 hour(s))  CBC     Status: Abnormal   Collection Time: 11/29/19  3:30 PM  Result Value Ref Range   WBC 11.7 (H) 4.0 - 10.5 K/uL   RBC 4.26 3.87 - 5.11 MIL/uL   Hemoglobin 11.1 (L) 12.0 - 15.0 g/dL   HCT 34.4 (L) 36.0 - 46.0 %   MCV 80.8 80.0 - 100.0 fL   MCH 26.1 26.0 - 34.0 pg   MCHC 32.3 30.0 - 36.0 g/dL   RDW 18.3 (H) 11.5 - 15.5 %   Platelets 412 (H) 150 - 400 K/uL   nRBC 0.0 0.0 - 0.2 %    Comment: Performed at [facility], [address] [place], [place] [address]  Basic metabolic panel     Status: Abnormal   Collection Time: 11/29/19  3:30 PM  Result Value Ref Range   Sodium 132 (L) 135 - 145 mmol/L   Potassium 4.6 3.5 - 5.1 mmol/L   Chloride 92 (L) 98 - 111 mmol/L   CO2 21 (L) 22 - 32 mmol/L   Glucose, Bld 132 (H) 70 - 99 mg/dL   BUN 21 8 - 23 mg/dL   Creatinine, Ser 1.07 (H) 0.44 - 1.00 mg/dL   Calcium 8.9 8.9 - 10.3 mg/dL   GFR calc non Af Amer 48 (L) >60 mL/min   GFR calc Af Amer 56 (L) >60 mL/min   Anion gap 19 (H) 5 - 15    Comment: Performed at [facility] [address] [place], [place] [address]  Lipase, blood     Status: None   Collection Time: 11/29/19  3:30 PM  Result Value Ref Range   Lipase 30 11 - 51 U/L    Comment: Performed at [facility] [address] [place], [place] [address]  Hepatic function panel     Status: Abnormal   Collection Time: 11/29/19  3:30 PM  Result Value Ref Range   Total Protein 6.8 6.5 - 8.1 g/dL   Albumin 2.4 (L) 3.5 - 5.0 g/dL   AST 112 (H) 15 - 41 U/L   ALT 58 (H) 0 - 44 U/L   Alkaline Phosphatase 372 (H) 38 - 126 U/L   Total Bilirubin 13.3 (H) 0.3 - 1.2 mg/dL   Bilirubin, Direct 7.8 (H) 0.0 - 0.2 mg/dL   Indirect Bilirubin 5.5 (H) 0.3 - 0.9 mg/dL    Comment: Performed at [facility] [address] [place], [place] [address]  SARS CORONAVIRUS 2 (TAT 6-24 HRS) Nasopharyngeal Nasopharyngeal Swab     Status: None   Collection Time: 11/29/19  7:52 PM    Specimen: Nasopharyngeal Swab  Result Value Ref Range   SARS Coronavirus 2 NEGATIVE NEGATIVE    Comment: (NOTE) SARS-CoV-2 target nucleic acids are NOT DETECTED. The SARS-CoV-2 RNA is generally  detectable in upper and lower respiratory specimens during the acute phase of infection. Negative results do not preclude SARS-CoV-2 infection, do not rule out co-infections with other pathogens, and should not be used as the sole basis for treatment or other patient management decisions. Negative results must be combined with clinical observations, patient history, and epidemiological information. The expected result is Negative. Fact Sheet for Patients: SugarRoll.be Fact Sheet for Healthcare Providers: https://www.woods-mathews.com/ This test is not yet approved or cleared by the [place] FDA and  has been authorized for detection and/or diagnosis of SARS-CoV-2 by FDA under an Emergency Use Authorization (EUA). This EUA will remain  in effect (meaning this test can be used) for the duration of the COVID-19 declaration under Section 56 4(b)(1) of the Act, 21 U.S.C. section 360bbb-3(b)(1), unless the authorization is terminated or revoked sooner. Performed at [facility], [place] [address], [place], [place] [address]   Urinalysis, Routine w reflex microscopic     Status: Abnormal   Collection Time: 11/29/19  8:37 PM  Result Value Ref Range   Color, Urine AMBER (A) YELLOW    Comment: BIOCHEMICALS MAY BE AFFECTED BY COLOR   APPearance CLOUDY (A) CLEAR   Specific Gravity, Urine 1.025 1.005 - 1.030   pH 5.5 5.0 - 8.0   Glucose, UA 100 (A) NEGATIVE mg/dL   Hgb urine dipstick TRACE (A) NEGATIVE   Bilirubin Urine LARGE (A) NEGATIVE   Ketones, ur 40 (A) NEGATIVE mg/dL   Protein, ur 30 (A) NEGATIVE mg/dL   Nitrite NEGATIVE NEGATIVE   Leukocytes,Ua TRACE (A) NEGATIVE    Comment: Performed at [facility], [place] [address]  [place], [place] [address]  Urinalysis, Microscopic (reflex)     Status: Abnormal   Collection Time: 11/29/19  8:37 PM  Result Value Ref Range   RBC / HPF 0-5 0 - 5 RBC/hpf   WBC, UA 6-10 0 - 5 WBC/hpf   Bacteria, UA RARE (A) NONE SEEN   Squamous Epithelial / LPF 0-5 0 - 5   Non Squamous Epithelial PRESENT (A) NONE SEEN   Mucus PRESENT    Hyaline Casts, UA PRESENT    Ca Oxalate Crys, UA PRESENT     Comment: Performed at [facility] [address], [place], [place] [address]  Glucose, capillary     Status: Abnormal   Collection Time: 11/29/19  9:48 PM  Result Value Ref Range   Glucose-Capillary 118 (H) 70 - 99 mg/dL  CBC     Status: Abnormal   Collection Time: 11/30/19  4:05 AM  Result Value Ref Range   WBC 8.9 4.0 - 10.5 K/uL   RBC 3.39 (L) 3.87 - 5.11 MIL/uL   Hemoglobin 8.6 (L) 12.0 - 15.0 g/dL    Comment: REPEATED TO VERIFY   HCT 27.5 (L) 36.0 - 46.0 %   MCV 81.1 80.0 - 100.0 fL   MCH 25.4 (L) 26.0 - 34.0 pg   MCHC 31.3 30.0 - 36.0 g/dL   RDW 18.0 (H) 11.5 - 15.5 %   Platelets 284 150 - 400 K/uL   nRBC 0.0 0.0 - 0.2 %    Comment: Performed at [facility], [place] [address] [place], [place] [address]  Comprehensive metabolic panel     Status: Abnormal   Collection Time: 11/30/19  4:05 AM  Result Value Ref Range   Sodium 131 (L) 135 - 145 mmol/L   Potassium 4.3 3.5 - 5.1 mmol/L   Chloride  94 (L) 98 - 111 mmol/L   CO2 25 22 - 32 mmol/L   Glucose, Bld 98 70 - 99 mg/dL   BUN 18 8 - 23 mg/dL   Creatinine, Ser 0.92 0.44 - 1.00 mg/dL   Calcium 8.4 (L) 8.9 - 10.3 mg/dL   Total Protein 5.2 (L) 6.5 - 8.1 g/dL   Albumin 1.9 (L) 3.5 - 5.0 g/dL   AST 80 (H) 15 - 41 U/L   ALT 44 0 - 44 U/L   Alkaline Phosphatase 275 (H) 38 - 126 U/L   Total Bilirubin 10.1 (H) 0.3 - 1.2 mg/dL   GFR calc non Af Amer 58 (L) >60 mL/min   GFR calc Af Amer >60 >60 mL/min   Anion gap 12 5 - 15    Comment: Performed at [facility] [address], [place], [place] [address]  Glucose,  capillary     Status: None   Collection Time: 11/30/19  7:30 AM  Result Value Ref Range   Glucose-Capillary 87 70 - 99 mg/dL  Glucose, capillary     Status: Abnormal   Collection Time: 11/30/19 11:14 AM  Result Value Ref Range   Glucose-Capillary 102 (H) 70 - 99 mg/dL  Glucose, capillary     Status: Abnormal   Collection Time: 11/30/19  4:09 PM  Result Value Ref Range   Glucose-Capillary 138 (H) 70 - 99 mg/dL   CT ABDOMEN PELVIS WO CONTRAST  Result Date: 11/29/2019 CLINICAL DATA:  85 year old female with history of epigastric pain. EXAM: CT ABDOMEN AND PELVIS WITHOUT CONTRAST TECHNIQUE: Multidetector CT imaging of the abdomen and pelvis was performed following the standard protocol without IV contrast. COMPARISON:  No priors. FINDINGS: Lower chest: Multiple pulmonary nodules noted throughout the visualized lung bases, largest of which is in the right lower lobe (axial image 8 of series 5) measuring 9 x 5 mm. Moderate-sized hiatal hernia. Aortic atherosclerosis. Atherosclerotic calcifications in the right coronary artery. Hepatobiliary: Multiple poorly defined low-attenuation lesions scattered throughout the liver, largest of which is in the right lobe of the liver measuring approximately 8.5 x 3.9 cm (axial image 26 of series 3), concerning for metastatic disease. Gallbladder is moderately distended with intermediate attenuation material within the lumen, suspicious for biliary sludge. No surrounding inflammatory changes. Pancreas: No definite pancreatic mass or peripancreatic fluid collections or inflammatory changes are noted on today's noncontrast CT examination. Spleen: Unremarkable. Adrenals/Urinary Tract: Tiny nonobstructive calculi are noted within the right renal collecting system measuring up to 2 mm in the interpolar region. Unenhanced appearance of the kidneys and bilateral adrenal glands are otherwise unremarkable. No hydroureteronephrosis. Urinary bladder is normal in appearance.  Bilateral adrenal glands are normal in appearance. Stomach/Bowel: Intra-abdominal portion of the stomach is normal in appearance. No pathologic dilatation of small bowel or colon. The appendix is not confidently identified and may be surgically absent. Regardless, there are no inflammatory changes noted adjacent to the cecum to suggest the presence of an acute appendicitis at this time. Vascular/Lymphatic: Aortic atherosclerosis. Mildly enlarged mesorectal lymph node on the left side (axial image 60 of series 3). No other lymphadenopathy noted elsewhere in the abdomen or pelvis. Reproductive: Status post hysterectomy. Ovaries are not confidently identified may be surgically absent or atrophic. Other: No significant volume of ascites.  No pneumoperitoneum. Musculoskeletal: Chronic compression fracture of T12 with post vertebroplasty changes and 40% loss of anterior vertebral body height. There are no aggressive appearing lytic or blastic lesions noted in the visualized portions of the  skeleton. IMPRESSION: 1. Multiple poorly defined hepatic lesions highly concerning for metastatic disease to the liver. These findings could be better evaluated with follow-up nonemergent abdominal MRI with and without IV gadolinium to clearly characterize these hepatic lesions. 2. Multiple small pulmonary nodules throughout the visualized lung bases also concerning for metastatic disease. 3. No definite primary malignancy is confidently identified. However, there is a mildly enlarged mesorectal lymph node measuring 1 cm in short axis. Further evaluation with nonemergent colonoscopy is suggested in the near future to better evaluate for the possibility of primary colorectal neoplasm. 4. Nonobstructive calculi in the right renal collecting system measuring up to 2 mm in the interpolar region. No ureteral stones or findings of urinary tract obstruction. 5. Aortic atherosclerosis, in addition to at least right coronary artery disease. 6.  Moderate-sized hiatal hernia. 7. Additional incidental findings, as above. Electronically Signed   By: Vinnie Langton M.D.   On: 11/29/2019 17:53   DG Chest 2 View  Result Date: 11/29/2019 CLINICAL DATA:  R[place]age and chest pain.  No known injury. EXAM: CHEST - 2 VIEW COMPARISON:  PA and lateral chest 03/17/2014. FINDINGS: Punctate calcified granuloma left upper lobe is unchanged. Lungs otherwise clear. Heart size normal. No pneumothorax or pleural effusion. The patient is status post vertebral augmentation at T12 for a prior fracture. No acute bony abnormality. IMPRESSION: No acute disease. Electronically Signed   By: Inge Rise M.D.   On: 11/29/2019 15:57   US Abdomen Complete  Result Date: 11/30/2019 CLINICAL DATA:  Epigastric pain and jaundice.  Hyperbilirubinemia. EXAM: ABDOMEN ULTRASOUND COMPLETE COMPARISON:  CT abdomen pelvis 11/29/2019 FINDINGS: Gallbladder: Gallbladder well distended without stones or gallbladder wall thickening. Negative sonographic Murphy sign. Mild gallbladder sludge. Common bile duct: Diameter: 13.3 mm. In addition, there is intrahepatic biliary dilatation. Liver: Multiple hypoechoic ill-defined masses throughout the liver as noted on CT. Largest mass in the left lobe measures 4 x 5 cm. Multiple additional masses in the right lobe. Portal vein is patent on color Doppler imaging with normal direction of blood flow towards the liver. IVC: No abnormality visualized. Pancreas: No mass identified in the pancreas. Spleen: Size and appearance within normal limits. Right Kidney: Length: 8.6 cm. Echogenicity within normal limits. No mass or hydronephrosis visualized. Left Kidney: Length: 7.5 cm. Echogenicity within normal limits. No mass or hydronephrosis visualized. Abdominal aorta: No aneurysm visualized. Other findings: None. IMPRESSION: Negative for gallstones Intrahepatic and extrahepatic biliary dilatation. Common bile duct 13.3 mm. No obstructing mass lesion is seen however  neoplasm is likely given the multiple liver lesions compatible with metastatic disease. No definite pancreatic mass on CT. Electronically Signed   By: Franchot Gallo M.D.   On: 11/30/2019 08:09   CT L-SPINE NO CHARGE  Result Date: 11/29/2019 CLINICAL DATA:  85 year old female status post trip and fall about a month ago. Pain radiating to the back. Jaundice. EXAM: CT LUMBAR SPINE WITHOUT CONTRAST TECHNIQUE: Technique: Multiplanar CT images of the lumbar spine were reconstructed from contemporary CT of the Abdomen and Pelvis. CONTRAST:  None COMPARISON:  Noncontrast CT Abdomen, and Pelvis today are reported separately. Report of [facility] lumbar spine MRI 09/05/2018 (no images available). FINDINGS: Segmentation: Normal. Alignment: Straightening of lumbar lordosis. No significant spondylolisthesis. Vertebrae: Osteopenia. Previously augmented T12 compression fracture. Bulky lower thoracic and lumbar endplate osteophytosis. Visible sacrum and SI joints appear intact. No acute or suspicious osseous lesion is identified. Paraspinal and other soft tissues: Negative lumbar paraspinal soft tissues. Abdominal and pelvic viscera reported separately today. Disc  levels: Degenerative multifactorial spinal stenosis: Moderate at T12-L1, moderate at L2-L3, moderate to severe at L3-L4, severe at L4-L5 (vacuum disc and severe disc space loss at this level). Widespread moderate lumbar neural foraminal stenosis bilateral L3 through L5 nerve levels. IMPRESSION: 1. Osteopenia. No acute osseous abnormality identified in the lumbar spine. Previously augmented T12 compression fracture. 2. See CT Abdomen and Pelvis today reported separately. 3. Widespread degenerative lumbar spinal stenosis, severe at L4-L5. 4. Aortic Atherosclerosis (ICD10-I70.0). Electronically Signed   By: Genevie Ann M.D.   On: 11/29/2019 18:39   Review of Systems Blood pressure 121/62, pulse 83, temperature 98.3 F (36.8 C), temperature source Oral, resp. rate 18, height 5'  (1.524 m), weight 44.5 kg, SpO2 91 %. Physical Exam  Constitutional: She is oriented to person, place, and time. She appears cachectic. She is cooperative. No distress.  Patient is visibly jaundiced  Eyes: Lids are normal. Scleral icterus is present.  Neck: No thyroid mass present.  Cardiovascular: Normal rate and regular rhythm.  Respiratory: Breath sounds normal.  GI: Soft. There is no hepatosplenomegaly. There is abdominal tenderness in the right upper quadrant and epigastric area. There is no rebound.  Neurological: She is alert and oriented to person, place, and time. She has normal strength.  Skin: Skin is warm and dry. She is not diaphoretic.  Psychiatric: She has a normal mood and affect. Her speech is normal and behavior is normal. Judgment and thought content normal. Cognition and memory are normal.   Assessment/Plan: 1) Obstructive jaundice with multiple mets in the liver and possibly in the lungs-of unknown primary-agree with the CEA levels and his CA 19 9 levels and a possible ERCP to help with her obstructive jaundice with a total bili of 10.1 today along with an alkaline phosphatase of 372. The liver lesions will need to be biopsied by IR and if these results are inconclusive she might need a colonoscopy and possibly an EGD as she may very well have a GI malignancy. 2) GERD/Hiatal hernia. 3) AODM/HTN/Hyperlipidemia. 4) Severe protein calorie malnutrition albumin of 1.9. 5) History of CVA. 6) DNR. Juanita Craver 11/30/2019, 5:49 PM

## 2019-11-30 NOTE — Progress Notes (Signed)
PROGRESS NOTE    Shirley Sanders  DZH:[id number] DOB: 1935/07/04 DOA: 11/29/2019 PCP: Mellody Dance, DO   Brief Narrative:  Patient is 85 year old female with history of CVA, type 2diabetes mellitus, hypertension, hyperlipidemia who presents to the emergency department for the evaluation of back pain and jaundice.  Reported 2 to 3 weeks of ongoing lower back pain with radiation across her left flank to the upper abdomen/epigastric region.  Also reported poor oral intake and stated that her skin has appeared yellow.  Had some nausea.  She has chronic intermittent loose stools and was feeling weak.  On presentation she was hemodynamically stable.  Bilirubin was elevated at 13.3 with elevated liver enzymes, ALP of 372.  Chest x-ray showed unchanged punctuate calcified granuloma of left upper lobe.  CT abdomen/pelvis without contrast showed multiple poorly defined hepatic lesions concerning for metastatic disease to the liver, multiple small nodules throughout the visualized lung bases, mildly enlarged mesorectal lymph node, nonobstructive calculi in the right renal collecting system, moderate size hiatal hernia.  CT lumbar spine showed widespread degenerative lumbar spinal stenosis.  Ultrasound of the abdomen shows intra and extrahepatic biliary dilation, dilated CBD.  Oncology and GI consulted.  Assessment & Plan:   Active Problems:   Hypertension associated with diabetes ([place])   Diabetes mellitus, type 2 ([place])   History of CVA with residual deficit   Hyperlipidemia associated with type 2 diabetes mellitus ([place])   Metastatic disease ([place])   Hyperbilirubinemia   Jaundice   Hyperbilirubinemia/metastatic liver lesions:  Suspected metastatic disease of unknown primary.  CT imaging as above.  Oncology will follow.  Ultrasound of the abdomen as above.  We have requested  for GI consultation for potential need of biliary stenting and colonoscopy.  She might need liver biopsy.  Type 2 diabetes  mellitus: On Actos at home.  Continue sliding scale insulin here.  Monitor CBGs  History of CVA: Continue statin.  Aspirin on hold for potential biopsy.  She has mild left residual hemiparesis.  She is ambulatory on baseline without  a walker or cane.  She lives by herself  Hypertension: Currently normotensive.  Home chlorthalidone and lisinopril on hold  Hyperlipidemia: On statin .  We will hold because of elevated liver enzymes, hyperbilirubinemia.         DVT prophylaxis:SCD Code Status: DNR Family Communication: Called daughter on phone on 11/30/2019 Disposition Plan: Patient is from home.  She is not stable for discharge and is waiting for further work-up   Consultants: GI, oncology  Procedures: None  Antimicrobials:  Anti-infectives (From admission, onward)   None      Subjective: Patient seen and examined at the bedside this morning.  Hemodynamically stable.  Looked comfortable.  No complain of  chest pain, shortness of breath abdomen pain, nausea or vomiting  Objective: Vitals:   11/29/19 2000 11/29/19 2033 11/29/19 2222 11/30/19 0425  BP: 125/67 129/69 129/69 105/69  Pulse: 80 78 78 81  Resp:  18 18 18   Temp:  98.4 F (36.9 C)  97.9 F (36.6 C)  TempSrc:  Oral  Oral  SpO2: 97% 99%  99%  Weight:   44.5 kg   Height:   5' (1.524 m)     Intake/Output Summary (Last 24 hours) at 11/30/2019 0821 Last data filed at 11/30/2019 0600 Gross per 24 hour  Intake 1111.22 ml  Output 181 ml  Net 930.22 ml   Filed Weights   11/29/19 1518 11/29/19 2222  Weight: 44.6 kg 44.5 kg  Examination:  General exam: Pleasant elderly female, deconditioned, debilitated HEENT:PERRL,Oral mucosa moist, Ear/Nose normal on gross exam Respiratory system: Bilateral equal air entry, normal vesicular breath sounds, no wheezes or crackles  Cardiovascular system: S1 & S2 heard, RRR. No JVD, murmurs, rubs, gallops or clicks. No pedal edema. Gastrointestinal system: Abdomen is  nondistended, soft and nontender. No organomegaly or masses felt. Normal bowel sounds heard. Central nervous system: Alert and oriented. No focal neurological deficits. Extremities: No edema, no clubbing ,no cyanosis Skin: No rashes, lesions or ulcers,icteric   Data Reviewed: I have personally reviewed following labs and imaging studies  CBC: Recent Labs  Lab 11/29/19 1530 11/30/19 0405  WBC 11.7* 8.9  HGB 11.1* 8.6*  HCT 34.4* 27.5*  MCV 80.8 81.1  PLT 412* 509   Basic Metabolic Panel: Recent Labs  Lab 11/29/19 1530 11/30/19 0405  NA 132* 131*  K 4.6 4.3  CL 92* 94*  CO2 21* 25  GLUCOSE 132* 98  BUN 21 18  CREATININE 1.07* 0.92  CALCIUM 8.9 8.4*   GFR: Estimated Creatinine Clearance: 33.1 mL/min (by C-G formula based on SCr of 0.92 mg/dL). Liver Function Tests: Recent Labs  Lab 11/29/19 1530 11/30/19 0405  AST 112* 80*  ALT 58* 44  ALKPHOS 372* 275*  BILITOT 13.3* 10.1*  PROT 6.8 5.2*  ALBUMIN 2.4* 1.9*   Recent Labs  Lab 11/29/19 1530  LIPASE 30   No results for input(s): AMMONIA in the last 168 hours. Coagulation Profile: No results for input(s): INR, PROTIME in the last 168 hours. Cardiac Enzymes: No results for input(s): CKTOTAL, CKMB, CKMBINDEX, TROPONINI in the last 168 hours. BNP (last 3 results) No results for input(s): PROBNP in the last 8760 hours. HbA1C: No results for input(s): HGBA1C in the last 72 hours. CBG: Recent Labs  Lab 11/29/19 2148 11/30/19 0730  GLUCAP 118* 87   Lipid Profile: No results for input(s): CHOL, HDL, LDLCALC, TRIG, CHOLHDL, LDLDIRECT in the last 72 hours. Thyroid Function Tests: No results for input(s): TSH, T4TOTAL, FREET4, T3FREE, THYROIDAB in the last 72 hours. Anemia Panel: No results for input(s): VITAMINB12, FOLATE, FERRITIN, TIBC, IRON, RETICCTPCT in the last 72 hours. Sepsis Labs: No results for input(s): PROCALCITON, LATICACIDVEN in the last 168 hours.  Recent Results (from the past 240 hour(s))    SARS CORONAVIRUS 2 (TAT 6-24 HRS) Nasopharyngeal Nasopharyngeal Swab     Status: None   Collection Time: 11/29/19  7:52 PM   Specimen: Nasopharyngeal Swab  Result Value Ref Range Status   SARS Coronavirus 2 NEGATIVE NEGATIVE Final    Comment: (NOTE) SARS-CoV-2 target nucleic acids are NOT DETECTED. The SARS-CoV-2 RNA is generally detectable in upper and lower respiratory specimens during the acute phase of infection. Negative results do not preclude SARS-CoV-2 infection, do not rule out co-infections with other pathogens, and should not be used as the sole basis for treatment or other patient management decisions. Negative results must be combined with clinical observations, patient history, and epidemiological information. The expected result is Negative. Fact Sheet for Patients: SugarRoll.be Fact Sheet for Healthcare Providers: https://www.woods-mathews.com/ This test is not yet approved or cleared by the [place] FDA and  has been authorized for detection and/or diagnosis of SARS-CoV-2 by FDA under an Emergency Use Authorization (EUA). This EUA will remain  in effect (meaning this test can be used) for the duration of the COVID-19 declaration under Section 56 4(b)(1) of the Act, 21 U.S.C. section 360bbb-3(b)(1), unless the authorization is terminated or revoked sooner. Performed  at [facility], [place] [address] [place], [place] [address]          Radiology Studies: CT ABDOMEN PELVIS WO CONTRAST  Result Date: 11/29/2019 CLINICAL DATA:  85 year old female with history of epigastric pain. EXAM: CT ABDOMEN AND PELVIS WITHOUT CONTRAST TECHNIQUE: Multidetector CT imaging of the abdomen and pelvis was performed following the standard protocol without IV contrast. COMPARISON:  No priors. FINDINGS: Lower chest: Multiple pulmonary nodules noted throughout the visualized lung bases, largest of which is in the right lower lobe (axial  image 8 of series 5) measuring 9 x 5 mm. Moderate-sized hiatal hernia. Aortic atherosclerosis. Atherosclerotic calcifications in the right coronary artery. Hepatobiliary: Multiple poorly defined low-attenuation lesions scattered throughout the liver, largest of which is in the right lobe of the liver measuring approximately 8.5 x 3.9 cm (axial image 26 of series 3), concerning for metastatic disease. Gallbladder is moderately distended with intermediate attenuation material within the lumen, suspicious for biliary sludge. No surrounding inflammatory changes. Pancreas: No definite pancreatic mass or peripancreatic fluid collections or inflammatory changes are noted on today's noncontrast CT examination. Spleen: Unremarkable. Adrenals/Urinary Tract: Tiny nonobstructive calculi are noted within the right renal collecting system measuring up to 2 mm in the interpolar region. Unenhanced appearance of the kidneys and bilateral adrenal glands are otherwise unremarkable. No hydroureteronephrosis. Urinary bladder is normal in appearance. Bilateral adrenal glands are normal in appearance. Stomach/Bowel: Intra-abdominal portion of the stomach is normal in appearance. No pathologic dilatation of small bowel or colon. The appendix is not confidently identified and may be surgically absent. Regardless, there are no inflammatory changes noted adjacent to the cecum to suggest the presence of an acute appendicitis at this time. Vascular/Lymphatic: Aortic atherosclerosis. Mildly enlarged mesorectal lymph node on the left side (axial image 60 of series 3). No other lymphadenopathy noted elsewhere in the abdomen or pelvis. Reproductive: Status post hysterectomy. Ovaries are not confidently identified may be surgically absent or atrophic. Other: No significant volume of ascites.  No pneumoperitoneum. Musculoskeletal: Chronic compression fracture of T12 with post vertebroplasty changes and 40% loss of anterior vertebral body height.  There are no aggressive appearing lytic or blastic lesions noted in the visualized portions of the skeleton. IMPRESSION: 1. Multiple poorly defined hepatic lesions highly concerning for metastatic disease to the liver. These findings could be better evaluated with follow-up nonemergent abdominal MRI with and without IV gadolinium to clearly characterize these hepatic lesions. 2. Multiple small pulmonary nodules throughout the visualized lung bases also concerning for metastatic disease. 3. No definite primary malignancy is confidently identified. However, there is a mildly enlarged mesorectal lymph node measuring 1 cm in short axis. Further evaluation with nonemergent colonoscopy is suggested in the near future to better evaluate for the possibility of primary colorectal neoplasm. 4. Nonobstructive calculi in the right renal collecting system measuring up to 2 mm in the interpolar region. No ureteral stones or findings of urinary tract obstruction. 5. Aortic atherosclerosis, in addition to at least right coronary artery disease. 6. Moderate-sized hiatal hernia. 7. Additional incidental findings, as above. Electronically Signed   By: Vinnie Langton M.D.   On: 11/29/2019 17:53   DG Chest 2 View  Result Date: 11/29/2019 CLINICAL DATA:  [place] and chest pain.  No known injury. EXAM: CHEST - 2 VIEW COMPARISON:  PA and lateral chest 03/17/2014. FINDINGS: Punctate calcified granuloma left upper lobe is unchanged. Lungs otherwise clear. Heart size normal. No pneumothorax or pleural effusion. The patient is status post vertebral augmentation  at T12 for a prior fracture. No acute bony abnormality. IMPRESSION: No acute disease. Electronically Signed   By: Inge Rise M.D.   On: 11/29/2019 15:57   US Abdomen Complete  Result Date: 11/30/2019 CLINICAL DATA:  Epigastric pain and jaundice.  Hyperbilirubinemia. EXAM: ABDOMEN ULTRASOUND COMPLETE COMPARISON:  CT abdomen pelvis 11/29/2019 FINDINGS: Gallbladder:  Gallbladder well distended without stones or gallbladder wall thickening. Negative sonographic Murphy sign. Mild gallbladder sludge. Common bile duct: Diameter: 13.3 mm. In addition, there is intrahepatic biliary dilatation. Liver: Multiple hypoechoic ill-defined masses throughout the liver as noted on CT. Largest mass in the left lobe measures 4 x 5 cm. Multiple additional masses in the right lobe. Portal vein is patent on color Doppler imaging with normal direction of blood flow towards the liver. IVC: No abnormality visualized. Pancreas: No mass identified in the pancreas. Spleen: Size and appearance within normal limits. Right Kidney: Length: 8.6 cm. Echogenicity within normal limits. No mass or hydronephrosis visualized. Left Kidney: Length: 7.5 cm. Echogenicity within normal limits. No mass or hydronephrosis visualized. Abdominal aorta: No aneurysm visualized. Other findings: None. IMPRESSION: Negative for gallstones Intrahepatic and extrahepatic biliary dilatation. Common bile duct 13.3 mm. No obstructing mass lesion is seen however neoplasm is likely given the multiple liver lesions compatible with metastatic disease. No definite pancreatic mass on CT. Electronically Signed   By: Franchot Gallo M.D.   On: 11/30/2019 08:09   CT L-SPINE NO CHARGE  Result Date: 11/29/2019 CLINICAL DATA:  85 year old female status post trip and fall about a month ago. Pain radiating to the back. Jaundice. EXAM: CT LUMBAR SPINE WITHOUT CONTRAST TECHNIQUE: Technique: Multiplanar CT images of the lumbar spine were reconstructed from contemporary CT of the Abdomen and Pelvis. CONTRAST:  None COMPARISON:  Noncontrast CT Abdomen, and Pelvis today are reported separately. Report of [facility] lumbar spine MRI 09/05/2018 (no images available). FINDINGS: Segmentation: Normal. Alignment: Straightening of lumbar lordosis. No significant spondylolisthesis. Vertebrae: Osteopenia. Previously augmented T12 compression fracture. Bulky lower  thoracic and lumbar endplate osteophytosis. Visible sacrum and SI joints appear intact. No acute or suspicious osseous lesion is identified. Paraspinal and other soft tissues: Negative lumbar paraspinal soft tissues. Abdominal and pelvic viscera reported separately today. Disc levels: Degenerative multifactorial spinal stenosis: Moderate at T12-L1, moderate at L2-L3, moderate to severe at L3-L4, severe at L4-L5 (vacuum disc and severe disc space loss at this level). Widespread moderate lumbar neural foraminal stenosis bilateral L3 through L5 nerve levels. IMPRESSION: 1. Osteopenia. No acute osseous abnormality identified in the lumbar spine. Previously augmented T12 compression fracture. 2. See CT Abdomen and Pelvis today reported separately. 3. Widespread degenerative lumbar spinal stenosis, severe at L4-L5. 4. Aortic Atherosclerosis (ICD10-I70.0). Electronically Signed   By: Genevie Ann M.D.   On: 11/29/2019 18:39        Scheduled Meds: . insulin aspart  0-9 Units Subcutaneous TID WC  . simvastatin  10 mg Oral QHS   Continuous Infusions: . lactated ringers 100 mL/hr at 11/30/19 0629     LOS: 0 days    Time spent: 35 mins.More than 50% of that time was spent in counseling and/or coordination of care.      Shelly Coss, MD [facility] P2/22/2021, 8:21 AM

## 2019-11-30 NOTE — Plan of Care (Signed)
  Problem: Activity: Goal: Risk for activity intolerance will decrease Outcome: Progressing   Problem: Pain Managment: Goal: General experience of comfort will improve Outcome: Progressing   

## 2019-11-30 NOTE — Progress Notes (Signed)
New Admission Note:   Arrival Method: from ED via stretcher Mental Orientation: A &O X4 Telemetry: none Assessment: Completed Skin: Intact, MASD under breast IV: LFA, saline locked Pain: 2/10, repositioned Tubes: None Safety Measures: Safety Fall Prevention Plan has been discussed  Admission: completed 5 [place] Orientation: Patient has been orientated to the room, unit and staff.   Family: none at bedside  Orders to be reviewed and implemented. Will continue to monitor the patient. Call light has been placed within reach and bed alarm has been activated.

## 2019-12-01 ENCOUNTER — Inpatient Hospital Stay (HOSPITAL_COMMUNITY): Payer: Medicare Other

## 2019-12-01 LAB — COMPREHENSIVE METABOLIC PANEL
ALT: 56 U/L — ABNORMAL HIGH (ref 0–44)
AST: 107 U/L — ABNORMAL HIGH (ref 15–41)
Albumin: 1.9 g/dL — ABNORMAL LOW (ref 3.5–5.0)
Alkaline Phosphatase: 290 U/L — ABNORMAL HIGH (ref 38–126)
Anion gap: 11 (ref 5–15)
BUN: 13 mg/dL (ref 8–23)
CO2: 27 mmol/L (ref 22–32)
Calcium: 8.6 mg/dL — ABNORMAL LOW (ref 8.9–10.3)
Chloride: 98 mmol/L (ref 98–111)
Creatinine, Ser: 0.95 mg/dL (ref 0.44–1.00)
GFR calc Af Amer: >60 mL/min (ref >60)
GFR calc non Af Amer: 56 mL/min — ABNORMAL LOW (ref >60)
Glucose, Bld: 80 mg/dL (ref 70–99)
Potassium: 4 mmol/L (ref 3.5–5.1)
Sodium: 136 mmol/L (ref 135–145)
Total Bilirubin: 11.4 mg/dL — ABNORMAL HIGH (ref 0.3–1.2)
Total Protein: 5.4 g/dL — ABNORMAL LOW (ref 6.5–8.1)

## 2019-12-01 LAB — CBC WITH DIFFERENTIAL/PLATELET
Abs Immature Granulocytes: 0.04 10*3/uL (ref 0.00–0.07)
Basophils Absolute: 0 10*3/uL (ref 0.0–0.1)
Basophils Relative: 0 %
Eosinophils Absolute: 0.6 10*3/uL — ABNORMAL HIGH (ref 0.0–0.5)
Eosinophils Relative: 7 %
HCT: 29.7 % — ABNORMAL LOW (ref 36.0–46.0)
Hemoglobin: 9.4 g/dL — ABNORMAL LOW (ref 12.0–15.0)
Immature Granulocytes: 1 %
Lymphocytes Relative: 9 %
Lymphs Abs: 0.7 10*3/uL (ref 0.7–4.0)
MCH: 25.9 pg — ABNORMAL LOW (ref 26.0–34.0)
MCHC: 31.6 g/dL (ref 30.0–36.0)
MCV: 81.8 fL (ref 80.0–100.0)
Monocytes Absolute: 0.5 10*3/uL (ref 0.1–1.0)
Monocytes Relative: 7 %
Neutro Abs: 6.2 10*3/uL (ref 1.7–7.7)
Neutrophils Relative %: 76 %
Platelets: 281 10*3/uL (ref 150–400)
RBC: 3.63 MIL/uL — ABNORMAL LOW (ref 3.87–5.11)
RDW: 18.9 % — ABNORMAL HIGH (ref 11.5–15.5)
WBC: 8.1 10*3/uL (ref 4.0–10.5)
nRBC: 0 % (ref 0.0–0.2)

## 2019-12-01 LAB — PROTIME-INR
INR: 1.6 — ABNORMAL HIGH (ref 0.8–1.2)
Prothrombin Time: 18.9 seconds — ABNORMAL HIGH (ref 11.4–15.2)

## 2019-12-01 LAB — GLUCOSE, CAPILLARY
Glucose-Capillary: 73 mg/dL (ref 70–99)
Glucose-Capillary: 92 mg/dL (ref 70–99)
Glucose-Capillary: 152 mg/dL — ABNORMAL HIGH (ref 70–99)
Glucose-Capillary: 155 mg/dL — ABNORMAL HIGH (ref 70–99)

## 2019-12-01 MED ORDER — BOOST PLUS PO LIQD
237.0000 mL | Freq: Three times a day (TID) | ORAL | Status: DC
  Administered 2019-12-01 – 2019-12-10 (×7): 237 mL via ORAL
  Administered 2019-12-11: 1 via ORAL
  Filled 2019-12-01 (×31): qty 237

## 2019-12-01 MED ORDER — PREDNISONE 50 MG PO TABS
50.0000 mg | ORAL_TABLET | Freq: Four times a day (QID) | ORAL | Status: AC
  Administered 2019-12-01 (×3): 50 mg via ORAL
  Filled 2019-12-01 (×3): qty 1

## 2019-12-01 MED ORDER — ADULT MULTIVITAMIN W/MINERALS CH
1.0000 | ORAL_TABLET | Freq: Every day | ORAL | Status: DC
  Administered 2019-12-01 – 2019-12-10 (×5): 1 via ORAL
  Filled 2019-12-01 (×8): qty 1

## 2019-12-01 MED ORDER — IOHEXOL 300 MG/ML  SOLN
75.0000 mL | Freq: Once | INTRAMUSCULAR | Status: AC | PRN
  Administered 2019-12-01: 75 mL via INTRAVENOUS

## 2019-12-01 MED ORDER — DIPHENHYDRAMINE HCL 25 MG PO CAPS
50.0000 mg | ORAL_CAPSULE | Freq: Once | ORAL | Status: AC
  Administered 2019-12-01: 50 mg via ORAL
  Filled 2019-12-01: qty 2

## 2019-12-01 NOTE — H&P (View-Only) (Signed)
Subjective: No complaints.  Objective: Vital signs in last 24 hours: Temp:  [97.7 F (36.5 C)-98.4 F (36.9 C)] 97.7 F (36.5 C) (02/23 1631) Pulse Rate:  [76-90] 76 (02/23 1631) Resp:  [16-19] 18 (02/23 1631) BP: (106-127)/(59-72) 127/71 (02/23 1631) SpO2:  [97 %-100 %] 97 % (02/23 1631) Last BM Date: 12/01/19  Intake/Output from previous day: 02/22 0701 - 02/23 0700 In: 480 [P.O.:480] Out: -  Intake/Output this shift: Total I/O In: 240 [P.O.:240] Out: 80 [Urine:80]  General appearance: alert, icteric and no distress GI: soft, non-tender; bowel sounds normal; no masses,  no organomegaly  Lab Results: Recent Labs    11/29/19 1530 11/30/19 0405 12/01/19 0459  WBC 11.7* 8.9 8.1  HGB 11.1* 8.6* 9.4*  HCT 34.4* 27.5* 29.7*  PLT 412* 284 281   BMET Recent Labs    11/29/19 1530 11/30/19 0405 12/01/19 0459  NA 132* 131* 136  K 4.6 4.3 4.0  CL 92* 94* 98  CO2 21* 25 27  GLUCOSE 132* 98 80  BUN 21 18 13   CREATININE 1.07* 0.92 0.95  CALCIUM 8.9 8.4* 8.6*   LFT Recent Labs    11/29/19 1530 11/30/19 0405 12/01/19 0459  PROT 6.8   < > 5.4*  ALBUMIN 2.4*   < > 1.9*  AST 112*   < > 107*  ALT 58*   < > 56*  ALKPHOS 372*   < > 290*  BILITOT 13.3*   < > 11.4*  BILIDIR 7.8*  --   --   IBILI 5.5*  --   --    < > = values in this interval not displayed.   PT/INR Recent Labs    12/01/19 1016  LABPROT 18.9*  INR 1.6*   Hepatitis Panel No results for input(s): HEPBSAG, HCVAB, HEPAIGM, HEPBIGM in the last 72 hours. C-Diff No results for input(s): CDIFFTOX in the last 72 hours. Fecal Lactopherrin No results for input(s): FECLLACTOFRN in the last 72 hours.  Studies/Results: CT ABDOMEN PELVIS WO CONTRAST  Result Date: 11/29/2019 CLINICAL DATA:  85 year old female with history of epigastric pain. EXAM: CT ABDOMEN AND PELVIS WITHOUT CONTRAST TECHNIQUE: Multidetector CT imaging of the abdomen and pelvis was performed following the standard protocol without IV  contrast. COMPARISON:  No priors. FINDINGS: Lower chest: Multiple pulmonary nodules noted throughout the visualized lung bases, largest of which is in the right lower lobe (axial image 8 of series 5) measuring 9 x 5 mm. Moderate-sized hiatal hernia. Aortic atherosclerosis. Atherosclerotic calcifications in the right coronary artery. Hepatobiliary: Multiple poorly defined low-attenuation lesions scattered throughout the liver, largest of which is in the right lobe of the liver measuring approximately 8.5 x 3.9 cm (axial image 26 of series 3), concerning for metastatic disease. Gallbladder is moderately distended with intermediate attenuation material within the lumen, suspicious for biliary sludge. No surrounding inflammatory changes. Pancreas: No definite pancreatic mass or peripancreatic fluid collections or inflammatory changes are noted on today's noncontrast CT examination. Spleen: Unremarkable. Adrenals/Urinary Tract: Tiny nonobstructive calculi are noted within the right renal collecting system measuring up to 2 mm in the interpolar region. Unenhanced appearance of the kidneys and bilateral adrenal glands are otherwise unremarkable. No hydroureteronephrosis. Urinary bladder is normal in appearance. Bilateral adrenal glands are normal in appearance. Stomach/Bowel: Intra-abdominal portion of the stomach is normal in appearance. No pathologic dilatation of small bowel or colon. The appendix is not confidently identified and may be surgically absent. Regardless, there are no inflammatory changes noted adjacent to the cecum to  suggest the presence of an acute appendicitis at this time. Vascular/Lymphatic: Aortic atherosclerosis. Mildly enlarged mesorectal lymph node on the left side (axial image 60 of series 3). No other lymphadenopathy noted elsewhere in the abdomen or pelvis. Reproductive: Status post hysterectomy. Ovaries are not confidently identified may be surgically absent or atrophic. Other: No significant  volume of ascites.  No pneumoperitoneum. Musculoskeletal: Chronic compression fracture of T12 with post vertebroplasty changes and 40% loss of anterior vertebral body height. There are no aggressive appearing lytic or blastic lesions noted in the visualized portions of the skeleton. IMPRESSION: 1. Multiple poorly defined hepatic lesions highly concerning for metastatic disease to the liver. These findings could be better evaluated with follow-up nonemergent abdominal MRI with and without IV gadolinium to clearly characterize these hepatic lesions. 2. Multiple small pulmonary nodules throughout the visualized lung bases also concerning for metastatic disease. 3. No definite primary malignancy is confidently identified. However, there is a mildly enlarged mesorectal lymph node measuring 1 cm in short axis. Further evaluation with nonemergent colonoscopy is suggested in the near future to better evaluate for the possibility of primary colorectal neoplasm. 4. Nonobstructive calculi in the right renal collecting system measuring up to 2 mm in the interpolar region. No ureteral stones or findings of urinary tract obstruction. 5. Aortic atherosclerosis, in addition to at least right coronary artery disease. 6. Moderate-sized hiatal hernia. 7. Additional incidental findings, as above. Electronically Signed   By: Vinnie Langton M.D.   On: 11/29/2019 17:53   US Abdomen Complete  Result Date: 11/30/2019 CLINICAL DATA:  Epigastric pain and jaundice.  Hyperbilirubinemia. EXAM: ABDOMEN ULTRASOUND COMPLETE COMPARISON:  CT abdomen pelvis 11/29/2019 FINDINGS: Gallbladder: Gallbladder well distended without stones or gallbladder wall thickening. Negative sonographic Murphy sign. Mild gallbladder sludge. Common bile duct: Diameter: 13.3 mm. In addition, there is intrahepatic biliary dilatation. Liver: Multiple hypoechoic ill-defined masses throughout the liver as noted on CT. Largest mass in the left lobe measures 4 x 5 cm.  Multiple additional masses in the right lobe. Portal vein is patent on color Doppler imaging with normal direction of blood flow towards the liver. IVC: No abnormality visualized. Pancreas: No mass identified in the pancreas. Spleen: Size and appearance within normal limits. Right Kidney: Length: 8.6 cm. Echogenicity within normal limits. No mass or hydronephrosis visualized. Left Kidney: Length: 7.5 cm. Echogenicity within normal limits. No mass or hydronephrosis visualized. Abdominal aorta: No aneurysm visualized. Other findings: None. IMPRESSION: Negative for gallstones Intrahepatic and extrahepatic biliary dilatation. Common bile duct 13.3 mm. No obstructing mass lesion is seen however neoplasm is likely given the multiple liver lesions compatible with metastatic disease. No definite pancreatic mass on CT. Electronically Signed   By: Franchot Gallo M.D.   On: 11/30/2019 08:09   CT L-SPINE NO CHARGE  Result Date: 11/29/2019 CLINICAL DATA:  85 year old female status post trip and fall about a month ago. Pain radiating to the back. Jaundice. EXAM: CT LUMBAR SPINE WITHOUT CONTRAST TECHNIQUE: Technique: Multiplanar CT images of the lumbar spine were reconstructed from contemporary CT of the Abdomen and Pelvis. CONTRAST:  None COMPARISON:  Noncontrast CT Abdomen, and Pelvis today are reported separately. Report of [facility] lumbar spine MRI 09/05/2018 (no images available). FINDINGS: Segmentation: Normal. Alignment: Straightening of lumbar lordosis. No significant spondylolisthesis. Vertebrae: Osteopenia. Previously augmented T12 compression fracture. Bulky lower thoracic and lumbar endplate osteophytosis. Visible sacrum and SI joints appear intact. No acute or suspicious osseous lesion is identified. Paraspinal and other soft tissues: Negative lumbar paraspinal soft tissues.  Abdominal and pelvic viscera reported separately today. Disc levels: Degenerative multifactorial spinal stenosis: Moderate at T12-L1, moderate at  L2-L3, moderate to severe at L3-L4, severe at L4-L5 (vacuum disc and severe disc space loss at this level). Widespread moderate lumbar neural foraminal stenosis bilateral L3 through L5 nerve levels. IMPRESSION: 1. Osteopenia. No acute osseous abnormality identified in the lumbar spine. Previously augmented T12 compression fracture. 2. See CT Abdomen and Pelvis today reported separately. 3. Widespread degenerative lumbar spinal stenosis, severe at L4-L5. 4. Aortic Atherosclerosis (ICD10-I70.0). Electronically Signed   By: Genevie Ann M.D.   On: 11/29/2019 18:39    Medications:  Scheduled: . diphenhydrAMINE  50 mg Oral Once  . insulin aspart  0-9 Units Subcutaneous TID WC  . lactose free nutrition  237 mL Oral TID WC  . multivitamin with minerals  1 tablet Oral Daily  . predniSONE  50 mg Oral Q6H   Continuous:   Assessment/Plan: 1) Biliary obstruction. 2) Metastatic disease. 3) Abnormal liver enzymes.   I spoke with radiology about the CT scan.  The noncontrast scan shows an abrupt cut off in the head of the pancreas.  There was no evidence of a pancreatic head mass or any stones.  There is also the suggestion of a primary source in the colon.  The patient denies having a prior colonoscopy in the past.  Given her current advanced presentation, a colonoscopy is not recommended.  Plan: 1) EUS/ERCP +/- Stent placement on Friday. 2) Liver biopsy tomorrow.  LOS: 1 day   Angeleena Dueitt D 12/01/2019, 4:49 PM

## 2019-12-01 NOTE — Progress Notes (Signed)
Initial Nutrition Assessment  DOCUMENTATION CODES:   Severe malnutrition in context of acute illness/injury  INTERVENTION:    Boost Plus chocolate TID- Each supplement provides 360kcal and 14g protein.    MVI daily   NUTRITION DIAGNOSIS:   Severe Malnutrition related to acute illness(newly diagnosed metastatic disease) as evidenced by severe muscle depletion, moderate fat depletion, percent weight loss, energy intake < or equal to 50% for > or equal to 5 days.  GOAL:   Patient will meet greater than or equal to 90% of their needs  MONITOR:   PO intake, Supplement acceptance, Weight trends, Labs, I & O's, Diet advancement, Skin  REASON FOR ASSESSMENT:   Malnutrition Screening Tool    ASSESSMENT:   Patient with PMH significant for CVA, DM, HTN, and HLD. Presents this admission with hyperbilirubinemia with suspected metastatic disease of unknown primary.   Plan for liver biopsy tomorrow.   Pt endorses a loss of appetite after her first stroke in 2015. Noticed that her meal portions decreased slowly since then. Appetite declined even further over the last month due to ongoing abdominal pain. During this time she consumed bites at each meal. Her daughter recently tried to find a supplement pt could tolerate but didn't have much luck (lactose intolerant). Pt currently NPO. Discussed lactose free supplements we have here. Pt willing to try Boost Plus.   Pt endorses a UBW of 115 lb and unintentional weight loss of 15 lb in the last month. Records indicate pt weighed 119 lb on 04/08/2019 and 98 lb this admission (17.6% wt loss in 7 months, significant for time frame).   Medications: SS novolog, prednisone Labs: LFTs elevated CBG 73-138  NUTRITION - FOCUSED PHYSICAL EXAM:    Most Recent Value  Orbital Region  Mild depletion  Upper Arm Region  Severe depletion  Thoracic and Lumbar Region  Unable to assess  Buccal Region  Moderate depletion  Temple Region  Severe depletion   Clavicle Bone Region  Severe depletion  Clavicle and Acromion Bone Region  Severe depletion  Scapular Bone Region  Unable to assess  Dorsal Hand  Moderate depletion  Patellar Region  Moderate depletion  Anterior Thigh Region  Moderate depletion  Posterior Calf Region  Moderate depletion  Edema (RD Assessment)  Mild  Hair  Reviewed  Eyes  Reviewed  Mouth  Reviewed  Skin  Reviewed  Nails  Reviewed     Diet Order:   Diet Order            Diet NPO time specified  Diet effective midnight        Diet Heart Room service appropriate? Yes; Fluid consistency: Thin  Diet effective now              EDUCATION NEEDS:   Education needs have been addressed  Skin:  Skin Assessment: Skin Integrity Issues: Skin Integrity Issues:: Other (Comment) Other: MASD- L/R breast  Last BM:  2/23  Height:   Ht Readings from Last 1 Encounters:  11/29/19 5' (1.524 m)    Weight:   Wt Readings from Last 1 Encounters:  11/29/19 44.5 kg    Ideal Body Weight:  45.5 kg  BMI:  Body mass index is 19.14 kg/m.  Estimated Nutritional Needs:   Kcal:  1400-1600 kcal  Protein:  70-90 grams  Fluid:  >/= 1.4 L/day   Mariana Single RD, LDN Clinical Nutrition Pager listed in [place]

## 2019-12-01 NOTE — Progress Notes (Signed)
PROGRESS NOTE    Shirley Sanders  NFA:[id number] DOB: Jul 17, 1935 DOA: 11/29/2019 PCP: Mellody Dance, DO   Brief Narrative:  Patient is 85 year old female with history of CVA, type 2diabetes mellitus, hypertension, hyperlipidemia who presents to the emergency department for the evaluation of back pain and jaundice.  Reported 2 to 3 weeks of ongoing lower back pain with radiation across her left flank to the upper abdomen/epigastric region.  Also reported poor oral intake and stated that her skin has appeared yellow.  Had some nausea.  She has chronic intermittent loose stools and was feeling weak.  On presentation she was hemodynamically stable.  Bilirubin was elevated at 13.3 with elevated liver enzymes, ALP of 372.  Chest x-ray showed unchanged punctuate calcified granuloma of left upper lobe.  CT abdomen/pelvis without contrast showed multiple poorly defined hepatic lesions concerning for metastatic disease to the liver, multiple small nodules throughout the visualized lung bases, mildly enlarged mesorectal lymph node, nonobstructive calculi in the right renal collecting system, moderate size hiatal hernia.  CT lumbar spine showed widespread degenerative lumbar spinal stenosis.  Ultrasound of the abdomen shows intra and extrahepatic biliary dilation, dilated CBD.  Oncology and GI consulted. Plan for liver biopsy tomorrow.  She will also undergo CT chest with contrast for staging.  Assessment & Plan:   Active Problems:   Hypertension associated with diabetes ([place])   Diabetes mellitus, type 2 ([place])   History of CVA with residual deficit   Hyperlipidemia associated with type 2 diabetes mellitus ([place])   Metastatic disease ([place])   Hyperbilirubinemia   Jaundice   Hyperbilirubinemia/metastatic liver lesions:  Suspected metastatic disease of unknown primary.  CT imaging as above.  Oncology following.  Ultrasound of the abdomen as above. Plan for liver biopsy tomorrow.  Also undergoing CT chest  with contrast for staging.  GI also following for possible colonoscopy/EGD/biliary stenting.  Type 2 diabetes mellitus: On Actos at home.  Continue sliding scale insulin here.  Monitor CBGs  History of CVA: Continue statin.  Aspirin on hold for potential biopsy.  She has mild left residual hemiparesis.  She is ambulatory on baseline without  a walker or cane.  She lives by herself  Hypertension: Currently normotensive.  Home chlorthalidone and lisinopril on hold  Hyperlipidemia: On statin .  We will hold because of elevated liver enzymes, hyperbilirubinemia.         DVT prophylaxis:SCD Code Status: DNR Family Communication: Called daughter on phone on 11/30/2019 Disposition Plan: Patient is from home.  She is not stable for discharge and is waiting for further work-up   Consultants: GI, oncology  Procedures: None  Antimicrobials:  Anti-infectives (From admission, onward)   None      Subjective: Patient seen and examined the bedside this morning.  Hemodynamically stable.  Comfortable.  No active issues  Objective: Vitals:   11/30/19 2128 12/01/19 0544 12/01/19 0815 12/01/19 1238  BP: 121/71 109/72 (!) 106/59 112/64  Pulse: 81 78 90 79  Resp: 16 16 19 18   Temp: 97.9 F (36.6 C) 98.2 F (36.8 C) 98 F (36.7 C) 98.2 F (36.8 C)  TempSrc: Oral Oral Oral Oral  SpO2: 97% 98% 100% 98%  Weight:      Height:        Intake/Output Summary (Last 24 hours) at 12/01/2019 1317 Last data filed at 12/01/2019 0806 Gross per 24 hour  Intake 360 ml  Output 80 ml  Net 280 ml   Filed Weights   11/29/19 1518 11/29/19 2222  Weight: 44.6 kg 44.5 kg    Examination:  General exam: Appears calm and comfortable ,Not in distress,average built Respiratory system: Bilateral equal air entry, normal vesicular breath sounds, no wheezes or crackles  Cardiovascular system: S1 & S2 heard, RRR. No JVD, murmurs, rubs, gallops or clicks. Gastrointestinal system: Abdomen is nondistended, soft  and nontender. No organomegaly or masses felt. Normal bowel sounds heard. Central nervous system: Alert and oriented. No focal neurological deficits. Extremities: No edema, no clubbing ,no cyanosis Skin: icterus   Data Reviewed: I have personally reviewed following labs and imaging studies  CBC: Recent Labs  Lab 11/29/19 1530 11/30/19 0405 12/01/19 0459  WBC 11.7* 8.9 8.1  NEUTROABS  --   --  6.2  HGB 11.1* 8.6* 9.4*  HCT 34.4* 27.5* 29.7*  MCV 80.8 81.1 81.8  PLT 412* 284 466   Basic Metabolic Panel: Recent Labs  Lab 11/29/19 1530 11/30/19 0405 12/01/19 0459  NA 132* 131* 136  K 4.6 4.3 4.0  CL 92* 94* 98  CO2 21* 25 27  GLUCOSE 132* 98 80  BUN 21 18 13   CREATININE 1.07* 0.92 0.95  CALCIUM 8.9 8.4* 8.6*   GFR: Estimated Creatinine Clearance: 32.1 mL/min (by C-G formula based on SCr of 0.95 mg/dL). Liver Function Tests: Recent Labs  Lab 11/29/19 1530 11/30/19 0405 12/01/19 0459  AST 112* 80* 107*  ALT 58* 44 56*  ALKPHOS 372* 275* 290*  BILITOT 13.3* 10.1* 11.4*  PROT 6.8 5.2* 5.4*  ALBUMIN 2.4* 1.9* 1.9*   Recent Labs  Lab 11/29/19 1530  LIPASE 30   No results for input(s): AMMONIA in the last 168 hours. Coagulation Profile: Recent Labs  Lab 12/01/19 1016  INR 1.6*   Cardiac Enzymes: No results for input(s): CKTOTAL, CKMB, CKMBINDEX, TROPONINI in the last 168 hours. BNP (last 3 results) No results for input(s): PROBNP in the last 8760 hours. HbA1C: No results for input(s): HGBA1C in the last 72 hours. CBG: Recent Labs  Lab 11/30/19 1114 11/30/19 1609 11/30/19 2129 12/01/19 0659 12/01/19 1113  GLUCAP 102* 138* 89 73 92   Lipid Profile: No results for input(s): CHOL, HDL, LDLCALC, TRIG, CHOLHDL, LDLDIRECT in the last 72 hours. Thyroid Function Tests: No results for input(s): TSH, T4TOTAL, FREET4, T3FREE, THYROIDAB in the last 72 hours. Anemia Panel: No results for input(s): VITAMINB12, FOLATE, FERRITIN, TIBC, IRON, RETICCTPCT in the  last 72 hours. Sepsis Labs: No results for input(s): PROCALCITON, LATICACIDVEN in the last 168 hours.  Recent Results (from the past 240 hour(s))  SARS CORONAVIRUS 2 (TAT 6-24 HRS) Nasopharyngeal Nasopharyngeal Swab     Status: None   Collection Time: 11/29/19  7:52 PM   Specimen: Nasopharyngeal Swab  Result Value Ref Range Status   SARS Coronavirus 2 NEGATIVE NEGATIVE Final    Comment: (NOTE) SARS-CoV-2 target nucleic acids are NOT DETECTED. The SARS-CoV-2 RNA is generally detectable in upper and lower respiratory specimens during the acute phase of infection. Negative results do not preclude SARS-CoV-2 infection, do not rule out co-infections with other pathogens, and should not be used as the sole basis for treatment or other patient management decisions. Negative results must be combined with clinical observations, patient history, and epidemiological information. The expected result is Negative. Fact Sheet for Patients: SugarRoll.be Fact Sheet for Healthcare Providers: https://www.woods-mathews.com/ This test is not yet approved or cleared by the [place] FDA and  has been authorized for detection and/or diagnosis of SARS-CoV-2 by FDA under an Emergency Use Authorization (EUA). This EUA  will remain  in effect (meaning this test can be used) for the duration of the COVID-19 declaration under Section 56 4(b)(1) of the Act, 21 U.S.C. section 360bbb-3(b)(1), unless the authorization is terminated or revoked sooner. Performed at [facility], [place] [address] [place], [place] [address]          Radiology Studies: CT ABDOMEN PELVIS WO CONTRAST  Result Date: 11/29/2019 CLINICAL DATA:  85 year old female with history of epigastric pain. EXAM: CT ABDOMEN AND PELVIS WITHOUT CONTRAST TECHNIQUE: Multidetector CT imaging of the abdomen and pelvis was performed following the standard protocol without IV contrast. COMPARISON:  No  priors. FINDINGS: Lower chest: Multiple pulmonary nodules noted throughout the visualized lung bases, largest of which is in the right lower lobe (axial image 8 of series 5) measuring 9 x 5 mm. Moderate-sized hiatal hernia. Aortic atherosclerosis. Atherosclerotic calcifications in the right coronary artery. Hepatobiliary: Multiple poorly defined low-attenuation lesions scattered throughout the liver, largest of which is in the right lobe of the liver measuring approximately 8.5 x 3.9 cm (axial image 26 of series 3), concerning for metastatic disease. Gallbladder is moderately distended with intermediate attenuation material within the lumen, suspicious for biliary sludge. No surrounding inflammatory changes. Pancreas: No definite pancreatic mass or peripancreatic fluid collections or inflammatory changes are noted on today's noncontrast CT examination. Spleen: Unremarkable. Adrenals/Urinary Tract: Tiny nonobstructive calculi are noted within the right renal collecting system measuring up to 2 mm in the interpolar region. Unenhanced appearance of the kidneys and bilateral adrenal glands are otherwise unremarkable. No hydroureteronephrosis. Urinary bladder is normal in appearance. Bilateral adrenal glands are normal in appearance. Stomach/Bowel: Intra-abdominal portion of the stomach is normal in appearance. No pathologic dilatation of small bowel or colon. The appendix is not confidently identified and may be surgically absent. Regardless, there are no inflammatory changes noted adjacent to the cecum to suggest the presence of an acute appendicitis at this time. Vascular/Lymphatic: Aortic atherosclerosis. Mildly enlarged mesorectal lymph node on the left side (axial image 60 of series 3). No other lymphadenopathy noted elsewhere in the abdomen or pelvis. Reproductive: Status post hysterectomy. Ovaries are not confidently identified may be surgically absent or atrophic. Other: No significant volume of ascites.  No  pneumoperitoneum. Musculoskeletal: Chronic compression fracture of T12 with post vertebroplasty changes and 40% loss of anterior vertebral body height. There are no aggressive appearing lytic or blastic lesions noted in the visualized portions of the skeleton. IMPRESSION: 1. Multiple poorly defined hepatic lesions highly concerning for metastatic disease to the liver. These findings could be better evaluated with follow-up nonemergent abdominal MRI with and without IV gadolinium to clearly characterize these hepatic lesions. 2. Multiple small pulmonary nodules throughout the visualized lung bases also concerning for metastatic disease. 3. No definite primary malignancy is confidently identified. However, there is a mildly enlarged mesorectal lymph node measuring 1 cm in short axis. Further evaluation with nonemergent colonoscopy is suggested in the near future to better evaluate for the possibility of primary colorectal neoplasm. 4. Nonobstructive calculi in the right renal collecting system measuring up to 2 mm in the interpolar region. No ureteral stones or findings of urinary tract obstruction. 5. Aortic atherosclerosis, in addition to at least right coronary artery disease. 6. Moderate-sized hiatal hernia. 7. Additional incidental findings, as above. Electronically Signed   By: Vinnie Langton M.D.   On: 11/29/2019 17:53   DG Chest 2 View  Result Date: 11/29/2019 CLINICAL DATA:  Ribcage and chest pain.  No known injury. EXAM:  CHEST - 2 VIEW COMPARISON:  PA and lateral chest 03/17/2014. FINDINGS: Punctate calcified granuloma left upper lobe is unchanged. Lungs otherwise clear. Heart size normal. No pneumothorax or pleural effusion. The patient is status post vertebral augmentation at T12 for a prior fracture. No acute bony abnormality. IMPRESSION: No acute disease. Electronically Signed   By: Inge Rise M.D.   On: 11/29/2019 15:57   US Abdomen Complete  Result Date: 11/30/2019 CLINICAL DATA:   Epigastric pain and jaundice.  Hyperbilirubinemia. EXAM: ABDOMEN ULTRASOUND COMPLETE COMPARISON:  CT abdomen pelvis 11/29/2019 FINDINGS: Gallbladder: Gallbladder well distended without stones or gallbladder wall thickening. Negative sonographic Murphy sign. Mild gallbladder sludge. Common bile duct: Diameter: 13.3 mm. In addition, there is intrahepatic biliary dilatation. Liver: Multiple hypoechoic ill-defined masses throughout the liver as noted on CT. Largest mass in the left lobe measures 4 x 5 cm. Multiple additional masses in the right lobe. Portal vein is patent on color Doppler imaging with normal direction of blood flow towards the liver. IVC: No abnormality visualized. Pancreas: No mass identified in the pancreas. Spleen: Size and appearance within normal limits. Right Kidney: Length: 8.6 cm. Echogenicity within normal limits. No mass or hydronephrosis visualized. Left Kidney: Length: 7.5 cm. Echogenicity within normal limits. No mass or hydronephrosis visualized. Abdominal aorta: No aneurysm visualized. Other findings: None. IMPRESSION: Negative for gallstones Intrahepatic and extrahepatic biliary dilatation. Common bile duct 13.3 mm. No obstructing mass lesion is seen however neoplasm is likely given the multiple liver lesions compatible with metastatic disease. No definite pancreatic mass on CT. Electronically Signed   By: Franchot Gallo M.D.   On: 11/30/2019 08:09   CT L-SPINE NO CHARGE  Result Date: 11/29/2019 CLINICAL DATA:  85 year old female status post trip and fall about a month ago. Pain radiating to the back. Jaundice. EXAM: CT LUMBAR SPINE WITHOUT CONTRAST TECHNIQUE: Technique: Multiplanar CT images of the lumbar spine were reconstructed from contemporary CT of the Abdomen and Pelvis. CONTRAST:  None COMPARISON:  Noncontrast CT Abdomen, and Pelvis today are reported separately. Report of [facility] lumbar spine MRI 09/05/2018 (no images available). FINDINGS: Segmentation: Normal. Alignment:  Straightening of lumbar lordosis. No significant spondylolisthesis. Vertebrae: Osteopenia. Previously augmented T12 compression fracture. Bulky lower thoracic and lumbar endplate osteophytosis. Visible sacrum and SI joints appear intact. No acute or suspicious osseous lesion is identified. Paraspinal and other soft tissues: Negative lumbar paraspinal soft tissues. Abdominal and pelvic viscera reported separately today. Disc levels: Degenerative multifactorial spinal stenosis: Moderate at T12-L1, moderate at L2-L3, moderate to severe at L3-L4, severe at L4-L5 (vacuum disc and severe disc space loss at this level). Widespread moderate lumbar neural foraminal stenosis bilateral L3 through L5 nerve levels. IMPRESSION: 1. Osteopenia. No acute osseous abnormality identified in the lumbar spine. Previously augmented T12 compression fracture. 2. See CT Abdomen and Pelvis today reported separately. 3. Widespread degenerative lumbar spinal stenosis, severe at L4-L5. 4. Aortic Atherosclerosis (ICD10-I70.0). Electronically Signed   By: Genevie Ann M.D.   On: 11/29/2019 18:39        Scheduled Meds: . diphenhydrAMINE  50 mg Oral Once  . insulin aspart  0-9 Units Subcutaneous TID WC  . predniSONE  50 mg Oral Q6H   Continuous Infusions:    LOS: 1 day    Time spent: 35 mins.More than 50% of that time was spent in counseling and/or coordination of care.      Shelly Coss, MD [facility] P2/23/2021, 1:17 PM

## 2019-12-01 NOTE — Consult Note (Signed)
Chief Complaint: Patient was seen in consultation today for liver biopsy.  Referring Physician(s): Dr. Tawanna Solo  Supervising Physician: Aletta Edouard  Patient Status: [name] - In-pt  History of Present Illness: Shirley Sanders is a 85 y.o. female with a past medical history significant for GERD, HTN, HLD, CVA and DM who presented to [name] ED on 11/29/19 with complaints of abdominal pain and back pain. She noted that she had recently become jaundice over the last week or so as well. Initial workup in the ED notable for WBC 11.7, hgb 11.1, plt 412, albumin 2.4, AST 112, ALT 58, ALP 372, t.bili 13.3 and UA (+) for large biliubin, ketones, protein and trace leukocytes. CT abd/pelvis w/o contrast was notable for multiple poorly defined hepatic lesions concerning for metastatic disease, multiple small pulmonary nodules concerning for metastatic disease, mildly enlarged mesorectal lymph node and nonobstructive calculi in the right renal collecting system measuring up to 2 mm in the interpolar region. She was admitted for further evaluation and management. IR has been consulted for a lesion biopsy.  Patient laying in bed waiting for her daughter to arrive to visit. She denies any complaints currently except that she needs help sitting up sometimes. She states that she was told this biopsy would happen yesterday. She has eaten breakfast and is eating crackers + juice during my exam today. She states understanding of the requested procedure and wishes to proceed.  Past Medical History:  Diagnosis Date   Bronchitis    CVA (cerebral infarction)    x 4   Diabetes mellitus without complication (HCC)    GERD (gastroesophageal reflux disease)    History of hiatal hernia    Hyperlipidemia    Hypertension    Multiple allergies    Murmur    Osteoporosis    Pneumonia    Renal disorder    Shingles    Shingles    Stroke ([place])    left sided sensory and motor deficits    Past Surgical  History:  Procedure Laterality Date   ABDOMINAL HYSTERECTOMY     CATARACT EXTRACTION Bilateral 02/23/2016, 03/22/2016   CATARACT EXTRACTION W/PHACO Left 03/01/2016   Procedure: CATARACT EXTRACTION PHACO AND INTRAOCULAR LENS PLACEMENT ([place]);  Surgeon: Eulogio Bear, MD;  Location: [facility] ORS;  Service: Ophthalmology;  Laterality: Left;  [place] 1.09 AP% 14.7 CDE 10.13 FLUID BAG LOT # [id number]   CATARACT EXTRACTION W/PHACO Right 03/22/2016   Procedure: CATARACT EXTRACTION PHACO AND INTRAOCULAR LENS PLACEMENT ([facility]);  Surgeon: Eulogio Bear, MD;  Location: [facility] ORS;  Service: Ophthalmology;  Laterality: Right;  Lot# [id number] H [place]: 01:19.5 AP%: 20.8 CDE: 16.57    kidney stones     KNEE SURGERY Right 03/29/2016   TONSILLECTOMY      Allergies: Fish allergy, Codeine, Contrast media [iodinated diagnostic agents], Milk-related compounds, and Shellfish-derived products  Medications: Prior to Admission medications   Medication Sig Start Date End Date Taking? Authorizing Provider  acetaminophen (TYLENOL) 500 MG tablet Take 500-1,000 mg by mouth every 6 (six) hours as needed for mild pain or headache.   Yes [provider]  aspirin EC 81 MG tablet Take 81 mg by mouth daily.    Yes [provider]  chlorpheniramine ([employer] ALLERGY RELIEF) 4 MG tablet Take 4 mg by mouth 2 (two) times daily as needed for allergies.   Yes [provider]  chlorthalidone (HYGROTON) 25 MG tablet Take 12.5 mg by mouth daily.  02/05/19  Yes [provider]  cholecalciferol (VITAMIN D3)  25 MCG (1000 UT) tablet Take 1 tablet (1,000 Units total) by mouth daily. 09/10/19  Yes Opalski, Deborah, DO  Continuous Blood Gluc Sensor (FREESTYLE LIBRE 14 DAY SENSOR) MISC 1 Cartridge by Does not apply route every 14 (fourteen) days. Dx: E11.649 Patient taking differently: Inject 1 patch into the skin every 14 (fourteen) days. Dx: E11.649  04/08/19  Yes Opalski, Deborah, DO  Ginkgo Biloba Extract (GINKGO  BILOBA MEMORY ENHANCER) 60 MG CAPS Take 60 mg by mouth daily.    Yes [provider]  lisinopril (ZESTRIL) 2.5 MG tablet Take 1 tablet (2.5 mg total) by mouth daily. 09/10/19  Yes Opalski, Deborah, DO  Multiple Vitamins-Minerals (PRESERVISION AREDS) CAPS Take 1 capsule by mouth 2 (two) times daily.    Yes [provider]  pioglitazone (ACTOS) 15 MG tablet Take 1 tablet (15 mg total) by mouth daily. 09/10/19  Yes Opalski, Deborah, DO  potassium chloride SA (K-DUR,KLOR-CON) 20 MEQ tablet Take 20 mEq by mouth at bedtime.    Yes [provider]  simvastatin (ZOCOR) 10 MG tablet Take 1 tablet (10 mg total) by mouth daily. Patient taking differently: Take 10 mg by mouth at bedtime.  09/10/19  Yes Opalski, Neoma Laming, DO  traMADol (ULTRAM) 50 MG tablet Take 50 mg by mouth in the morning and at bedtime.  11/23/19  Yes [provider]  Continuous Blood Gluc Receiver (FREESTYLE LIBRE 14 DAY READER) DEVI 1 Device by Does not apply route 3 (three) times daily. Dx: E11.649 02/05/18   Gildardo Cranker, DO     Family History  Problem Relation Age of Onset   Stroke Mother    Hypertension Father    Kidney disease Father    Diabetes Father    Cancer Father    Diabetes Sister    Cancer Sister        lung   Stroke Sister    Heart disease Brother    Diabetes Brother    Heart disease Brother    Stroke Brother    Diabetes Brother    Stroke Brother     Social History   Socioeconomic History   Marital status: Divorced    Spouse name: Not on file   Number of children: 2   Years of education: 11th   Highest education level: Not on file  Occupational History   Occupation: Advertising copywriter  Tobacco Use   Smoking status: Never Smoker   Smokeless tobacco: Never Used  Substance and Sexual Activity   Alcohol use: No   Drug use: No   Sexual activity: Never  Other Topics Concern   Not on file  Social History Narrative   Patient lives at home alone, one  stories, no pets   Patient right handed   Patient drinks coffee and diet coke   Past profession- Marketing executive   Social Determinants of Health   Financial Resource Strain:    Difficulty of Paying Living Expenses: Not on file  Food Insecurity:    Worried About Charity fundraiser in the Last Year: Not on file   [employer] of Food in the Last Year: Not on file  Transportation Needs:    Lack of Transportation (Medical): Not on file   Lack of Transportation (Non-Medical): Not on file  Physical Activity:    Days of Exercise per Week: Not on file   Minutes of Exercise per Session: Not on file  Stress:    Feeling of Stress : Not on file  Social Connections:  Frequency of Communication with Friends and Family: Not on file   Frequency of Social Gatherings with Friends and Family: Not on file   Attends Religious Services: Not on file   Active Member of Clubs or Organizations: Not on file   Attends Archivist Meetings: Not on file   Marital Status: Not on file     Review of Systems: A 12 point ROS discussed and pertinent positives are indicated in the HPI above.  All other systems are negative.  Review of Systems  Constitutional: Negative for chills and fever.  Respiratory: Negative for cough and shortness of breath.   Cardiovascular: Negative for chest pain.  Gastrointestinal: Negative for abdominal pain, diarrhea, nausea and vomiting.  Musculoskeletal: Positive for back pain (chronic).  Skin: Positive for color change.  Neurological: Negative for dizziness and headaches.    Vital Signs: BP (!) 106/59 (BP Location: Left Arm) Comment: informed nurse of the finding   Pulse 90    Temp 98 F (36.7 C) (Oral)    Resp 19    Ht 5' (1.524 m)    Wt 98 lb (44.5 kg)    SpO2 100%    BMI 19.14 kg/m   Physical Exam Vitals and nursing note reviewed.  Constitutional:      General: She is not in acute distress.    Appearance: She is ill-appearing.  HENT:     Head:  Normocephalic.     Mouth/Throat:     Mouth: Mucous membranes are dry.     Pharynx: Oropharynx is clear. No oropharyngeal exudate or posterior oropharyngeal erythema.  Eyes:     General: Scleral icterus present.  Cardiovascular:     Rate and Rhythm: Normal rate and regular rhythm.  Pulmonary:     Effort: Pulmonary effort is normal.     Breath sounds: Normal breath sounds.  Abdominal:     General: There is no distension.     Palpations: Abdomen is soft.     Tenderness: There is no abdominal tenderness.  Skin:    General: Skin is warm and dry.     Coloration: Skin is jaundiced.  Neurological:     Mental Status: She is alert and oriented to person, place, and time.  Psychiatric:        Mood and Affect: Mood normal.        Behavior: Behavior normal.        Thought Content: Thought content normal.        Judgment: Judgment normal.      MD Evaluation Airway: WNL Heart: WNL Abdomen: WNL Chest/ Lungs: WNL ASA  Classification: 3 Mallampati/Airway Score: Two   Imaging: CT ABDOMEN PELVIS WO CONTRAST  Result Date: 11/29/2019 CLINICAL DATA:  85 year old female with history of epigastric pain. EXAM: CT ABDOMEN AND PELVIS WITHOUT CONTRAST TECHNIQUE: Multidetector CT imaging of the abdomen and pelvis was performed following the standard protocol without IV contrast. COMPARISON:  No priors. FINDINGS: Lower chest: Multiple pulmonary nodules noted throughout the visualized lung bases, largest of which is in the right lower lobe (axial image 8 of series 5) measuring 9 x 5 mm. Moderate-sized hiatal hernia. Aortic atherosclerosis. Atherosclerotic calcifications in the right coronary artery. Hepatobiliary: Multiple poorly defined low-attenuation lesions scattered throughout the liver, largest of which is in the right lobe of the liver measuring approximately 8.5 x 3.9 cm (axial image 26 of series 3), concerning for metastatic disease. Gallbladder is moderately distended with intermediate attenuation  material within the lumen, suspicious for biliary sludge.  No surrounding inflammatory changes. Pancreas: No definite pancreatic mass or peripancreatic fluid collections or inflammatory changes are noted on today's noncontrast CT examination. Spleen: Unremarkable. Adrenals/Urinary Tract: Tiny nonobstructive calculi are noted within the right renal collecting system measuring up to 2 mm in the interpolar region. Unenhanced appearance of the kidneys and bilateral adrenal glands are otherwise unremarkable. No hydroureteronephrosis. Urinary bladder is normal in appearance. Bilateral adrenal glands are normal in appearance. Stomach/Bowel: Intra-abdominal portion of the stomach is normal in appearance. No pathologic dilatation of small bowel or colon. The appendix is not confidently identified and may be surgically absent. Regardless, there are no inflammatory changes noted adjacent to the cecum to suggest the presence of an acute appendicitis at this time. Vascular/Lymphatic: Aortic atherosclerosis. Mildly enlarged mesorectal lymph node on the left side (axial image 60 of series 3). No other lymphadenopathy noted elsewhere in the abdomen or pelvis. Reproductive: Status post hysterectomy. Ovaries are not confidently identified may be surgically absent or atrophic. Other: No significant volume of ascites.  No pneumoperitoneum. Musculoskeletal: Chronic compression fracture of T12 with post vertebroplasty changes and 40% loss of anterior vertebral body height. There are no aggressive appearing lytic or blastic lesions noted in the visualized portions of the skeleton. IMPRESSION: 1. Multiple poorly defined hepatic lesions highly concerning for metastatic disease to the liver. These findings could be better evaluated with follow-up nonemergent abdominal MRI with and without IV gadolinium to clearly characterize these hepatic lesions. 2. Multiple small pulmonary nodules throughout the visualized lung bases also concerning for  metastatic disease. 3. No definite primary malignancy is confidently identified. However, there is a mildly enlarged mesorectal lymph node measuring 1 cm in short axis. Further evaluation with nonemergent colonoscopy is suggested in the near future to better evaluate for the possibility of primary colorectal neoplasm. 4. Nonobstructive calculi in the right renal collecting system measuring up to 2 mm in the interpolar region. No ureteral stones or findings of urinary tract obstruction. 5. Aortic atherosclerosis, in addition to at least right coronary artery disease. 6. Moderate-sized hiatal hernia. 7. Additional incidental findings, as above. Electronically Signed   By: Vinnie Langton M.D.   On: 11/29/2019 17:53   DG Chest 2 View  Result Date: 11/29/2019 CLINICAL DATA:  Ribcage and chest pain.  No known injury. EXAM: CHEST - 2 VIEW COMPARISON:  PA and lateral chest 03/17/2014. FINDINGS: Punctate calcified granuloma left upper lobe is unchanged. Lungs otherwise clear. Heart size normal. No pneumothorax or pleural effusion. The patient is status post vertebral augmentation at T12 for a prior fracture. No acute bony abnormality. IMPRESSION: No acute disease. Electronically Signed   By: Inge Rise M.D.   On: 11/29/2019 15:57   US Abdomen Complete  Result Date: 11/30/2019 CLINICAL DATA:  Epigastric pain and jaundice.  Hyperbilirubinemia. EXAM: ABDOMEN ULTRASOUND COMPLETE COMPARISON:  CT abdomen pelvis 11/29/2019 FINDINGS: Gallbladder: Gallbladder well distended without stones or gallbladder wall thickening. Negative sonographic Murphy sign. Mild gallbladder sludge. Common bile duct: Diameter: 13.3 mm. In addition, there is intrahepatic biliary dilatation. Liver: Multiple hypoechoic ill-defined masses throughout the liver as noted on CT. Largest mass in the left lobe measures 4 x 5 cm. Multiple additional masses in the right lobe. Portal vein is patent on color Doppler imaging with normal direction of  blood flow towards the liver. IVC: No abnormality visualized. Pancreas: No mass identified in the pancreas. Spleen: Size and appearance within normal limits. Right Kidney: Length: 8.6 cm. Echogenicity within normal limits. No mass or hydronephrosis visualized. Left  Kidney: Length: 7.5 cm. Echogenicity within normal limits. No mass or hydronephrosis visualized. Abdominal aorta: No aneurysm visualized. Other findings: None. IMPRESSION: Negative for gallstones Intrahepatic and extrahepatic biliary dilatation. Common bile duct 13.3 mm. No obstructing mass lesion is seen however neoplasm is likely given the multiple liver lesions compatible with metastatic disease. No definite pancreatic mass on CT. Electronically Signed   By: Franchot Gallo M.D.   On: 11/30/2019 08:09   CT L-SPINE NO CHARGE  Result Date: 11/29/2019 CLINICAL DATA:  85 year old female status post trip and fall about a month ago. Pain radiating to the back. Jaundice. EXAM: CT LUMBAR SPINE WITHOUT CONTRAST TECHNIQUE: Technique: Multiplanar CT images of the lumbar spine were reconstructed from contemporary CT of the Abdomen and Pelvis. CONTRAST:  None COMPARISON:  Noncontrast CT Abdomen, and Pelvis today are reported separately. Report of [facility] lumbar spine MRI 09/05/2018 (no images available). FINDINGS: Segmentation: Normal. Alignment: Straightening of lumbar lordosis. No significant spondylolisthesis. Vertebrae: Osteopenia. Previously augmented T12 compression fracture. Bulky lower thoracic and lumbar endplate osteophytosis. Visible sacrum and SI joints appear intact. No acute or suspicious osseous lesion is identified. Paraspinal and other soft tissues: Negative lumbar paraspinal soft tissues. Abdominal and pelvic viscera reported separately today. Disc levels: Degenerative multifactorial spinal stenosis: Moderate at T12-L1, moderate at L2-L3, moderate to severe at L3-L4, severe at L4-L5 (vacuum disc and severe disc space loss at this level). Widespread  moderate lumbar neural foraminal stenosis bilateral L3 through L5 nerve levels. IMPRESSION: 1. Osteopenia. No acute osseous abnormality identified in the lumbar spine. Previously augmented T12 compression fracture. 2. See CT Abdomen and Pelvis today reported separately. 3. Widespread degenerative lumbar spinal stenosis, severe at L4-L5. 4. Aortic Atherosclerosis (ICD10-I70.0). Electronically Signed   By: Genevie Ann M.D.   On: 11/29/2019 18:39    Labs:  CBC: Recent Labs    05/05/19 0839 11/29/19 1530 11/30/19 0405 12/01/19 0459  WBC 4.5 11.7* 8.9 8.1  HGB 12.2 11.1* 8.6* 9.4*  HCT 36.7 34.4* 27.5* 29.7*  PLT 264 412* 284 281    COAGS: No results for input(s): INR, APTT in the last 8760 hours.  BMP: Recent Labs    05/05/19 0839 11/29/19 1530 11/30/19 0405 12/01/19 0459  NA 140 132* 131* 136  K 4.2 4.6 4.3 4.0  CL 99 92* 94* 98  CO2 23 21* 25 27  GLUCOSE 129* 132* 98 80  BUN 17 21 18 13   CALCIUM 9.6 8.9 8.4* 8.6*  CREATININE 1.08* 1.07* 0.92 0.95  GFRNONAA 48* 48* 58* 56*  GFRAA 55* 56* >60 >60    LIVER FUNCTION TESTS: Recent Labs    05/05/19 0839 11/29/19 1530 11/30/19 0405 12/01/19 0459  BILITOT 0.4 13.3* 10.1* 11.4*  AST 18 112* 80* 107*  ALT 7 58* 44 56*  ALKPHOS 92 372* 275* 290*  PROT 6.7 6.8 5.2* 5.4*  ALBUMIN 4.3 2.4* 1.9* 1.9*    TUMOR MARKERS: No results for input(s): AFPTM, CEA, CA199, CHROMGRNA in the last 8760 hours.  Assessment and Plan:  85 y/o F admitted for obstructive jaundice likely 2/2 metastatic disease - IR has been consulted for percutaneous liver lesion biopsy. Patient history and imaging reviewed by Dr. Kathlene Cote who approves patient for procedure.  Patient reports eating breakfast this morning and was eating crackers + juice on my exam today (~[phone])- as such will plan for procedure tomorrow in [place] with moderate sedation. Patient to be NPO after midnight on 2/24, hold anticoagulation until post procedure, IR will call for patient when  ready.  Risks and benefits of liver lesion was discussed with the patient and/or patient's family including, but not limited to bleeding, infection, damage to adjacent structures or low yield requiring additional tests.  All of the questions were answered and there is agreement to proceed.  Consent signed and in chart.   Thank you for this interesting consult.  I greatly enjoyed meeting JAZARA SWINEY and look forward to participating in their care.  A copy of this report was sent to the requesting provider on this date.  Electronically Signed: Joaquim Nam, PA-C 12/01/2019, 10:29 AM   I spent a total of 64 Minutesin face to face in clinical consultation, greater than 50% of which was counseling/coordinating care for liver lesion biopsy.

## 2019-12-01 NOTE — Plan of Care (Signed)
  Problem: Education: Goal: Knowledge of General Education information will improve Description: Including pain rating scale, medication(s)/side effects and non-pharmacologic comfort measures Outcome: Progressing   Problem: Coping: Goal: Level of anxiety will decrease Outcome: Progressing   

## 2019-12-01 NOTE — Progress Notes (Signed)
Subjective: No complaints.  Objective: Vital signs in last 24 hours: Temp:  [97.7 F (36.5 C)-98.4 F (36.9 C)] 97.7 F (36.5 C) (02/23 1631) Pulse Rate:  [76-90] 76 (02/23 1631) Resp:  [16-19] 18 (02/23 1631) BP: (106-127)/(59-72) 127/71 (02/23 1631) SpO2:  [97 %-100 %] 97 % (02/23 1631) Last BM Date: 12/01/19  Intake/Output from previous day: 02/22 0701 - 02/23 0700 In: 480 [P.O.:480] Out: -  Intake/Output this shift: Total I/O In: 240 [P.O.:240] Out: 80 [Urine:80]  General appearance: alert, icteric and no distress GI: soft, non-tender; bowel sounds normal; no masses,  no organomegaly  Lab Results: Recent Labs    11/29/19 1530 11/30/19 0405 12/01/19 0459  WBC 11.7* 8.9 8.1  HGB 11.1* 8.6* 9.4*  HCT 34.4* 27.5* 29.7*  PLT 412* 284 281   BMET Recent Labs    11/29/19 1530 11/30/19 0405 12/01/19 0459  NA 132* 131* 136  K 4.6 4.3 4.0  CL 92* 94* 98  CO2 21* 25 27  GLUCOSE 132* 98 80  BUN 21 18 13   CREATININE 1.07* 0.92 0.95  CALCIUM 8.9 8.4* 8.6*   LFT Recent Labs    11/29/19 1530 11/30/19 0405 12/01/19 0459  PROT 6.8   < > 5.4*  ALBUMIN 2.4*   < > 1.9*  AST 112*   < > 107*  ALT 58*   < > 56*  ALKPHOS 372*   < > 290*  BILITOT 13.3*   < > 11.4*  BILIDIR 7.8*  --   --   IBILI 5.5*  --   --    < > = values in this interval not displayed.   PT/INR Recent Labs    12/01/19 1016  LABPROT 18.9*  INR 1.6*   Hepatitis Panel No results for input(s): HEPBSAG, HCVAB, HEPAIGM, HEPBIGM in the last 72 hours. C-Diff No results for input(s): CDIFFTOX in the last 72 hours. Fecal Lactopherrin No results for input(s): FECLLACTOFRN in the last 72 hours.  Studies/Results: CT ABDOMEN PELVIS WO CONTRAST  Result Date: 11/29/2019 CLINICAL DATA:  85 year old female with history of epigastric pain. EXAM: CT ABDOMEN AND PELVIS WITHOUT CONTRAST TECHNIQUE: Multidetector CT imaging of the abdomen and pelvis was performed following the standard protocol without IV  contrast. COMPARISON:  No priors. FINDINGS: Lower chest: Multiple pulmonary nodules noted throughout the visualized lung bases, largest of which is in the right lower lobe (axial image 8 of series 5) measuring 9 x 5 mm. Moderate-sized hiatal hernia. Aortic atherosclerosis. Atherosclerotic calcifications in the right coronary artery. Hepatobiliary: Multiple poorly defined low-attenuation lesions scattered throughout the liver, largest of which is in the right lobe of the liver measuring approximately 8.5 x 3.9 cm (axial image 26 of series 3), concerning for metastatic disease. Gallbladder is moderately distended with intermediate attenuation material within the lumen, suspicious for biliary sludge. No surrounding inflammatory changes. Pancreas: No definite pancreatic mass or peripancreatic fluid collections or inflammatory changes are noted on today's noncontrast CT examination. Spleen: Unremarkable. Adrenals/Urinary Tract: Tiny nonobstructive calculi are noted within the right renal collecting system measuring up to 2 mm in the interpolar region. Unenhanced appearance of the kidneys and bilateral adrenal glands are otherwise unremarkable. No hydroureteronephrosis. Urinary bladder is normal in appearance. Bilateral adrenal glands are normal in appearance. Stomach/Bowel: Intra-abdominal portion of the stomach is normal in appearance. No pathologic dilatation of small bowel or colon. The appendix is not confidently identified and may be surgically absent. Regardless, there are no inflammatory changes noted adjacent to the cecum to  suggest the presence of an acute appendicitis at this time. Vascular/Lymphatic: Aortic atherosclerosis. Mildly enlarged mesorectal lymph node on the left side (axial image 60 of series 3). No other lymphadenopathy noted elsewhere in the abdomen or pelvis. Reproductive: Status post hysterectomy. Ovaries are not confidently identified may be surgically absent or atrophic. Other: No significant  volume of ascites.  No pneumoperitoneum. Musculoskeletal: Chronic compression fracture of T12 with post vertebroplasty changes and 40% loss of anterior vertebral body height. There are no aggressive appearing lytic or blastic lesions noted in the visualized portions of the skeleton. IMPRESSION: 1. Multiple poorly defined hepatic lesions highly concerning for metastatic disease to the liver. These findings could be better evaluated with follow-up nonemergent abdominal MRI with and without IV gadolinium to clearly characterize these hepatic lesions. 2. Multiple small pulmonary nodules throughout the visualized lung bases also concerning for metastatic disease. 3. No definite primary malignancy is confidently identified. However, there is a mildly enlarged mesorectal lymph node measuring 1 cm in short axis. Further evaluation with nonemergent colonoscopy is suggested in the near future to better evaluate for the possibility of primary colorectal neoplasm. 4. Nonobstructive calculi in the right renal collecting system measuring up to 2 mm in the interpolar region. No ureteral stones or findings of urinary tract obstruction. 5. Aortic atherosclerosis, in addition to at least right coronary artery disease. 6. Moderate-sized hiatal hernia. 7. Additional incidental findings, as above. Electronically Signed   By: Vinnie Langton M.D.   On: 11/29/2019 17:53   US Abdomen Complete  Result Date: 11/30/2019 CLINICAL DATA:  Epigastric pain and jaundice.  Hyperbilirubinemia. EXAM: ABDOMEN ULTRASOUND COMPLETE COMPARISON:  CT abdomen pelvis 11/29/2019 FINDINGS: Gallbladder: Gallbladder well distended without stones or gallbladder wall thickening. Negative sonographic Murphy sign. Mild gallbladder sludge. Common bile duct: Diameter: 13.3 mm. In addition, there is intrahepatic biliary dilatation. Liver: Multiple hypoechoic ill-defined masses throughout the liver as noted on CT. Largest mass in the left lobe measures 4 x 5 cm.  Multiple additional masses in the right lobe. Portal vein is patent on color Doppler imaging with normal direction of blood flow towards the liver. IVC: No abnormality visualized. Pancreas: No mass identified in the pancreas. Spleen: Size and appearance within normal limits. Right Kidney: Length: 8.6 cm. Echogenicity within normal limits. No mass or hydronephrosis visualized. Left Kidney: Length: 7.5 cm. Echogenicity within normal limits. No mass or hydronephrosis visualized. Abdominal aorta: No aneurysm visualized. Other findings: None. IMPRESSION: Negative for gallstones Intrahepatic and extrahepatic biliary dilatation. Common bile duct 13.3 mm. No obstructing mass lesion is seen however neoplasm is likely given the multiple liver lesions compatible with metastatic disease. No definite pancreatic mass on CT. Electronically Signed   By: Franchot Gallo M.D.   On: 11/30/2019 08:09   CT L-SPINE NO CHARGE  Result Date: 11/29/2019 CLINICAL DATA:  85 year old female status post trip and fall about a month ago. Pain radiating to the back. Jaundice. EXAM: CT LUMBAR SPINE WITHOUT CONTRAST TECHNIQUE: Technique: Multiplanar CT images of the lumbar spine were reconstructed from contemporary CT of the Abdomen and Pelvis. CONTRAST:  None COMPARISON:  Noncontrast CT Abdomen, and Pelvis today are reported separately. Report of [facility] lumbar spine MRI 09/05/2018 (no images available). FINDINGS: Segmentation: Normal. Alignment: Straightening of lumbar lordosis. No significant spondylolisthesis. Vertebrae: Osteopenia. Previously augmented T12 compression fracture. Bulky lower thoracic and lumbar endplate osteophytosis. Visible sacrum and SI joints appear intact. No acute or suspicious osseous lesion is identified. Paraspinal and other soft tissues: Negative lumbar paraspinal soft tissues.  Abdominal and pelvic viscera reported separately today. Disc levels: Degenerative multifactorial spinal stenosis: Moderate at T12-L1, moderate at  L2-L3, moderate to severe at L3-L4, severe at L4-L5 (vacuum disc and severe disc space loss at this level). Widespread moderate lumbar neural foraminal stenosis bilateral L3 through L5 nerve levels. IMPRESSION: 1. Osteopenia. No acute osseous abnormality identified in the lumbar spine. Previously augmented T12 compression fracture. 2. See CT Abdomen and Pelvis today reported separately. 3. Widespread degenerative lumbar spinal stenosis, severe at L4-L5. 4. Aortic Atherosclerosis (ICD10-I70.0). Electronically Signed   By: Genevie Ann M.D.   On: 11/29/2019 18:39    Medications:  Scheduled: . diphenhydrAMINE  50 mg Oral Once  . insulin aspart  0-9 Units Subcutaneous TID WC  . lactose free nutrition  237 mL Oral TID WC  . multivitamin with minerals  1 tablet Oral Daily  . predniSONE  50 mg Oral Q6H   Continuous:   Assessment/Plan: 1) Biliary obstruction. 2) Metastatic disease. 3) Abnormal liver enzymes.   I spoke with radiology about the CT scan.  The noncontrast scan shows an abrupt cut off in the head of the pancreas.  There was no evidence of a pancreatic head mass or any stones.  There is also the suggestion of a primary source in the colon.  The patient denies having a prior colonoscopy in the past.  Given her current advanced presentation, a colonoscopy is not recommended.  Plan: 1) EUS/ERCP +/- Stent placement on Friday. 2) Liver biopsy tomorrow.  LOS: 1 day   Zahmir Lalla D 12/01/2019, 4:49 PM

## 2019-12-02 ENCOUNTER — Other Ambulatory Visit: Payer: Medicare Other

## 2019-12-02 DIAGNOSIS — E43 Unspecified severe protein-calorie malnutrition: Secondary | ICD-10-CM | POA: Insufficient documentation

## 2019-12-02 LAB — COMPREHENSIVE METABOLIC PANEL
ALT: 60 U/L — ABNORMAL HIGH (ref 0–44)
AST: 91 U/L — ABNORMAL HIGH (ref 15–41)
Albumin: 2 g/dL — ABNORMAL LOW (ref 3.5–5.0)
Alkaline Phosphatase: 296 U/L — ABNORMAL HIGH (ref 38–126)
Anion gap: 15 (ref 5–15)
BUN: 15 mg/dL (ref 8–23)
CO2: 22 mmol/L (ref 22–32)
Calcium: 8.3 mg/dL — ABNORMAL LOW (ref 8.9–10.3)
Chloride: 95 mmol/L — ABNORMAL LOW (ref 98–111)
Creatinine, Ser: 1.02 mg/dL — ABNORMAL HIGH (ref 0.44–1.00)
GFR calc Af Amer: 59 mL/min — ABNORMAL LOW (ref >60)
GFR calc non Af Amer: 51 mL/min — ABNORMAL LOW (ref >60)
Glucose, Bld: 165 mg/dL — ABNORMAL HIGH (ref 70–99)
Potassium: 4.2 mmol/L (ref 3.5–5.1)
Sodium: 132 mmol/L — ABNORMAL LOW (ref 135–145)
Total Bilirubin: 12.1 mg/dL — ABNORMAL HIGH (ref 0.3–1.2)
Total Protein: 5.6 g/dL — ABNORMAL LOW (ref 6.5–8.1)

## 2019-12-02 LAB — GLUCOSE, CAPILLARY
Glucose-Capillary: 157 mg/dL — ABNORMAL HIGH (ref 70–99)
Glucose-Capillary: 146 mg/dL — ABNORMAL HIGH (ref 70–99)
Glucose-Capillary: 158 mg/dL — ABNORMAL HIGH (ref 70–99)
Glucose-Capillary: 107 mg/dL — ABNORMAL HIGH (ref 70–99)

## 2019-12-02 LAB — PROTIME-INR
INR: 1.7 — ABNORMAL HIGH (ref 0.8–1.2)
Prothrombin Time: 20.1 seconds — ABNORMAL HIGH (ref 11.4–15.2)

## 2019-12-02 LAB — TSH: TSH: 0.719 u[IU]/mL (ref 0.350–4.500)

## 2019-12-02 LAB — CEA: CEA: 959 ng/mL — ABNORMAL HIGH (ref 0.0–4.7)

## 2019-12-02 LAB — CANCER ANTIGEN 19-9: CA 19-9: 14 U/mL (ref 0–35)

## 2019-12-02 NOTE — Plan of Care (Signed)
  Problem: Education: Goal: Knowledge of General Education information will improve Description: Including pain rating scale, medication(s)/side effects and non-pharmacologic comfort measures Outcome: Progressing   Problem: Pain Managment: Goal: General experience of comfort will improve Outcome: Progressing   Problem: Safety: Goal: Ability to remain free from injury will improve Outcome: Progressing   

## 2019-12-02 NOTE — Progress Notes (Signed)
Patient manifest increase confusion. 3x getting out of bed. asked where she is going pt.said she is going to the other room to talk to her daughter. asked pt. If she knows where she is right now, pt verbalize home. disoriented to place, time and situaion. Guided pt. back to bed, reorient pt. And put the bed alarm on. Call bell and telephone within pt. reach. Will continue to monitor.

## 2019-12-02 NOTE — Progress Notes (Signed)
PROGRESS NOTE    NANCEE BROWNRIGG  DPO:[id number] DOB: [birth year] DOA: 11/29/2019 PCP: Mellody Dance, DO   Brief Narrative:  Patient is 85 year old female with history of CVA, type 2diabetes mellitus, hypertension, hyperlipidemia who presents to the emergency department for the evaluation of back pain and jaundice.  Reported 2 to 3 weeks of ongoing lower back pain with radiation across her left flank to the upper abdomen/epigastric region.  Also reported poor oral intake and stated that her skin has appeared yellow.  Had some nausea.  She has chronic intermittent loose stools and was feeling weak.  On presentation she was hemodynamically stable.  Bilirubin was elevated at 13.3 with elevated liver enzymes, ALP of 372.  Chest x-ray showed unchanged punctuate calcified granuloma of left upper lobe.  CT abdomen/pelvis without contrast showed multiple poorly defined hepatic lesions concerning for metastatic disease to the liver, multiple small nodules throughout the visualized lung bases, mildly enlarged mesorectal lymph node, nonobstructive calculi in the right renal collecting system, moderate size hiatal hernia.  CT lumbar spine showed widespread degenerative lumbar spinal stenosis.  Ultrasound of the abdomen shows intra and extrahepatic biliary dilation, dilated CBD.  Oncology and GI consulted. Plan for liver biopsy but could not be performed today due to INR of 1.8  Assessment & Plan:   Active Problems:   Hypertension associated with diabetes ([place])   Diabetes mellitus, type 2 ([place])   History of CVA with residual deficit   Hyperlipidemia associated with type 2 diabetes mellitus ([place])   Metastatic disease ([place])   Hyperbilirubinemia   Jaundice   Protein-calorie malnutrition, severe   Hyperbilirubinemia/metastatic liver lesions:  Suspected metastatic disease of unknown primary.  CT imaging as above.  Oncology following.  Ultrasound of the abdomen as above. Plan for liver biopsy tomorrow.  Also  undergoing CT chest with contrast for stagign showed numerous subcentimeter bilateral pulmonary nodules.  GI also following for possible colonoscopy/EGD/biliary stenting. Liver biopsy planned for today but could not be performed due to INR of 1.8  Type 2 diabetes mellitus: On Actos at home.  Continue sliding scale insulin here.  Monitor CBGs  History of CVA: Continue statin.  Aspirin on hold for potential biopsy.  She has mild left residual hemiparesis.  She is ambulatory on baseline without  a walker or cane.  She lives by herself  Hypertension: Currently normotensive.  Home chlorthalidone and lisinopril on hold  Hyperlipidemia: On statin .  We will hold because of elevated liver enzymes, hyperbilirubinemia.  Right thyroid nodule: CT scan showed indeterminate right thyroid nodule of 1.8 cm we will check ultrasound of the thyroid.  Will check TSH    Nutrition Problem: Severe Malnutrition Etiology: acute illness(newly diagnosed metastatic disease)      DVT prophylaxis:SCD Code Status: DNR Family Communication: Called daughter on phone on 11/30/2019 Disposition Plan: Patient is from home.  She is not stable for discharge and is waiting for further work-up   Consultants: GI, oncology  Procedures: None  Antimicrobials:  Anti-infectives (From admission, onward)   None      Subjective: Patient seen and examined at the bedside this morning.  Comfortable.  No active issues.  Liver biopsy cudnt be performed today due to INR.  Plan for tomorrow.  Objective: Vitals:   12/01/19 1631 12/01/19 2205 12/02/19 0558 12/02/19 0727  BP: 127/71 111/79 119/67   Pulse: 76 76 85 79  Resp: 18 16 16 15   Temp: 97.7 F (36.5 C) 97.7 F (36.5 C) 97.8 F (36.6 C)  TempSrc: Oral Oral Oral   SpO2: 97% 99% 100%   Weight:  45 kg    Height:        Intake/Output Summary (Last 24 hours) at 12/02/2019 0815 Last data filed at 12/02/2019 0600 Gross per 24 hour  Intake 657 ml  Output 450 ml  Net  207 ml   Filed Weights   11/29/19 1518 11/29/19 2222 12/01/19 2205  Weight: 44.6 kg 44.5 kg 45 kg    Examination:  General exam: Appears calm and comfortable ,Not in distress, pleasant elderly female  HEENT:PERRL,Oral mucosa moist, Ear/Nose normal on gross exam Respiratory system: Bilateral equal air entry, normal vesicular breath sounds, no wheezes or crackles  Cardiovascular system: S1 & S2 heard, RRR. No JVD, murmurs, rubs, gallops or clicks. Gastrointestinal system: Abdomen is nondistended, soft and nontender. No organomegaly or masses felt. Normal bowel sounds heard. Central nervous system: Alert and oriented. No focal neurological deficits. Extremities: No edema, no clubbing ,no cyanosis Skin: icterus    Data Reviewed: I have personally reviewed following labs and imaging studies  CBC: Recent Labs  Lab 11/29/19 1530 11/30/19 0405 12/01/19 0459  WBC 11.7* 8.9 8.1  NEUTROABS  --   --  6.2  HGB 11.1* 8.6* 9.4*  HCT 34.4* 27.5* 29.7*  MCV 80.8 81.1 81.8  PLT 412* 284 017   Basic Metabolic Panel: Recent Labs  Lab 11/29/19 1530 11/30/19 0405 12/01/19 0459 12/02/19 0439  NA 132* 131* 136 132*  K 4.6 4.3 4.0 4.2  CL 92* 94* 98 95*  CO2 21* 25 27 22   GLUCOSE 132* 98 80 165*  BUN 21 18 13 15   CREATININE 1.07* 0.92 0.95 1.02*  CALCIUM 8.9 8.4* 8.6* 8.3*   GFR: Estimated Creatinine Clearance: 30.2 mL/min (A) (by C-G formula based on SCr of 1.02 mg/dL (H)). Liver Function Tests: Recent Labs  Lab 11/29/19 1530 11/30/19 0405 12/01/19 0459 12/02/19 0439  AST 112* 80* 107* 91*  ALT 58* 44 56* 60*  ALKPHOS 372* 275* 290* 296*  BILITOT 13.3* 10.1* 11.4* 12.1*  PROT 6.8 5.2* 5.4* 5.6*  ALBUMIN 2.4* 1.9* 1.9* 2.0*   Recent Labs  Lab 11/29/19 1530  LIPASE 30   No results for input(s): AMMONIA in the last 168 hours. Coagulation Profile: Recent Labs  Lab 12/01/19 1016 12/02/19 0727  INR 1.6* 1.7*   Cardiac Enzymes: No results for input(s): CKTOTAL, CKMB,  CKMBINDEX, TROPONINI in the last 168 hours. BNP (last 3 results) No results for input(s): PROBNP in the last 8760 hours. HbA1C: No results for input(s): HGBA1C in the last 72 hours. CBG: Recent Labs  Lab 12/01/19 0659 12/01/19 1113 12/01/19 1629 12/01/19 2206 12/02/19 0704  GLUCAP 73 92 152* 155* 157*   Lipid Profile: No results for input(s): CHOL, HDL, LDLCALC, TRIG, CHOLHDL, LDLDIRECT in the last 72 hours. Thyroid Function Tests: No results for input(s): TSH, T4TOTAL, FREET4, T3FREE, THYROIDAB in the last 72 hours. Anemia Panel: No results for input(s): VITAMINB12, FOLATE, FERRITIN, TIBC, IRON, RETICCTPCT in the last 72 hours. Sepsis Labs: No results for input(s): PROCALCITON, LATICACIDVEN in the last 168 hours.  Recent Results (from the past 240 hour(s))  SARS CORONAVIRUS 2 (TAT 6-24 HRS) Nasopharyngeal Nasopharyngeal Swab     Status: None   Collection Time: 11/29/19  7:52 PM   Specimen: Nasopharyngeal Swab  Result Value Ref Range Status   SARS Coronavirus 2 NEGATIVE NEGATIVE Final    Comment: (NOTE) SARS-CoV-2 target nucleic acids are NOT DETECTED. The SARS-CoV-2 RNA is generally  detectable in upper and lower respiratory specimens during the acute phase of infection. Negative results do not preclude SARS-CoV-2 infection, do not rule out co-infections with other pathogens, and should not be used as the sole basis for treatment or other patient management decisions. Negative results must be combined with clinical observations, patient history, and epidemiological information. The expected result is Negative. Fact Sheet for Patients: SugarRoll.be Fact Sheet for Healthcare Providers: https://www.woods-mathews.com/ This test is not yet approved or cleared by the [place] FDA and  has been authorized for detection and/or diagnosis of SARS-CoV-2 by FDA under an Emergency Use Authorization (EUA). This EUA will remain  in effect  (meaning this test can be used) for the duration of the COVID-19 declaration under Section 56 4(b)(1) of the Act, 21 U.S.C. section 360bbb-3(b)(1), unless the authorization is terminated or revoked sooner. Performed at [facility], [place] [address], [place], [place] [address]          Radiology Studies: CT CHEST W CONTRAST  Result Date: 12/01/2019 CLINICAL DATA:  Diffuse liver metastases with unknown primary EXAM: CT CHEST WITH CONTRAST TECHNIQUE: Multidetector CT imaging of the chest was performed during intravenous contrast administration. CONTRAST:  7mL OMNIPAQUE IOHEXOL 300 MG/ML  SOLN COMPARISON:  11/29/2019 FINDINGS: Cardiovascular: Heart is unremarkable without pericardial effusion. Thoracic aorta is normal in caliber without dissection. Minimal atherosclerosis of the aortic arch and coronary vasculature. Mediastinum/Nodes: No pathologic adenopathy. Heterogeneous 1.8 cm right lobe thyroid nodule with indeterminate calcifications. Small hiatal hernia. Lungs/Pleura: There are numerous subcentimeter pulmonary nodules. Index nodules are as follows: Right lower lobe, image 92, 9 mm. Left lower lobe, image 66, 7 mm. Right upper lobe, image 70, 6 mm. No effusion or pneumothorax.  The central airways are patent. Upper Abdomen: Numerous hypodense masses throughout the liver consistent with metastatic disease. Likely lymphadenopathy at the porta hepatis, incompletely evaluated. Musculoskeletal: There are no acute bony abnormalities. Chronic T12 compression fracture with prior vertebral augmentation. Reconstructed images demonstrate no additional findings. IMPRESSION: 1. Diffuse metastatic disease throughout the liver, with likely adenopathy at the porta hepatis incompletely evaluated. Please see previous CT abdomen 11/29/2019. The liver lesions are amenable to percutaneous biopsy if tissue diagnosis is desired. 2. Numerous subcentimeter bilateral pulmonary nodules, index lesions provided above.  Nodules are too small for percutaneous sampling, and borderline for detection by PET scan. 3. Indeterminate 1.8 cm right lobe thyroid nodule. Recommend thyroid US. (Ref: J Am Coll Radiol. 2015 Feb;12(2): 143-50). Electronically Signed   By: Randa Ngo M.D.   On: 12/01/2019 23:15        Scheduled Meds: . insulin aspart  0-9 Units Subcutaneous TID WC  . lactose free nutrition  237 mL Oral TID WC  . multivitamin with minerals  1 tablet Oral Daily   Continuous Infusions:    LOS: 2 days    Time spent: 35 mins.More than 50% of that time was spent in counseling and/or coordination of care.      Shelly Coss, MD [facility] P2/24/2021, 8:15 AM

## 2019-12-02 NOTE — Progress Notes (Signed)
Patient tentatively planned for percutaneous liver lesion biopsy today in IR - patient INR 1.6 yesterday, repeat this morning 1.7. Discussed with attending IR MD (Dr. Damita Dunnings) who recommends holding on liver lesion biopsy until INR improves as there is a significant risk of bleeding. This information was relayed to Dr. Tawanna Solo   I will make patient NPO at midnight on 2/25 and repeat INR tomorrow morning for possible liver lesion biopsy. Ok to restart diet today from [employer] perspective.  Please call with any questions or concerns.  Candiss Norse, PA-C

## 2019-12-02 NOTE — Plan of Care (Signed)
  Problem: Clinical Measurements: Goal: Diagnostic test results will improve Outcome: Progressing   

## 2019-12-03 ENCOUNTER — Inpatient Hospital Stay (HOSPITAL_COMMUNITY): Payer: Medicare Other

## 2019-12-03 ENCOUNTER — Other Ambulatory Visit: Payer: Self-pay

## 2019-12-03 LAB — GLUCOSE, CAPILLARY
Glucose-Capillary: 116 mg/dL — ABNORMAL HIGH (ref 70–99)
Glucose-Capillary: 102 mg/dL — ABNORMAL HIGH (ref 70–99)
Glucose-Capillary: 118 mg/dL — ABNORMAL HIGH (ref 70–99)
Glucose-Capillary: 127 mg/dL — ABNORMAL HIGH (ref 70–99)

## 2019-12-03 LAB — COMPREHENSIVE METABOLIC PANEL
ALT: 64 U/L — ABNORMAL HIGH (ref 0–44)
AST: 93 U/L — ABNORMAL HIGH (ref 15–41)
Albumin: 2.1 g/dL — ABNORMAL LOW (ref 3.5–5.0)
Alkaline Phosphatase: 317 U/L — ABNORMAL HIGH (ref 38–126)
Anion gap: 13 (ref 5–15)
BUN: 18 mg/dL (ref 8–23)
CO2: 24 mmol/L (ref 22–32)
Calcium: 8.4 mg/dL — ABNORMAL LOW (ref 8.9–10.3)
Chloride: 97 mmol/L — ABNORMAL LOW (ref 98–111)
Creatinine, Ser: 0.97 mg/dL (ref 0.44–1.00)
GFR calc Af Amer: >60 mL/min (ref >60)
GFR calc non Af Amer: 54 mL/min — ABNORMAL LOW (ref >60)
Glucose, Bld: 118 mg/dL — ABNORMAL HIGH (ref 70–99)
Potassium: 3.7 mmol/L (ref 3.5–5.1)
Sodium: 134 mmol/L — ABNORMAL LOW (ref 135–145)
Total Bilirubin: 11.3 mg/dL — ABNORMAL HIGH (ref 0.3–1.2)
Total Protein: 5.3 g/dL — ABNORMAL LOW (ref 6.5–8.1)

## 2019-12-03 LAB — PROTIME-INR
INR: 1.6 — ABNORMAL HIGH (ref 0.8–1.2)
Prothrombin Time: 18.5 seconds — ABNORMAL HIGH (ref 11.4–15.2)

## 2019-12-03 MED ORDER — FENTANYL CITRATE (PF) 100 MCG/2ML IJ SOLN
INTRAMUSCULAR | Status: AC | PRN
  Administered 2019-12-03: 25 ug via INTRAVENOUS

## 2019-12-03 MED ORDER — FENTANYL CITRATE (PF) 100 MCG/2ML IJ SOLN
INTRAMUSCULAR | Status: AC
  Filled 2019-12-03: qty 2

## 2019-12-03 MED ORDER — LIDOCAINE HCL (PF) 1 % IJ SOLN
INTRAMUSCULAR | Status: AC
  Filled 2019-12-03: qty 30

## 2019-12-03 MED ORDER — GELATIN ABSORBABLE 12-7 MM EX MISC
CUTANEOUS | Status: AC
  Filled 2019-12-03: qty 1

## 2019-12-03 MED ORDER — MIDAZOLAM HCL 2 MG/2ML IJ SOLN
INTRAMUSCULAR | Status: AC | PRN
  Administered 2019-12-03: 0.5 mg via INTRAVENOUS

## 2019-12-03 MED ORDER — MIDAZOLAM HCL 2 MG/2ML IJ SOLN
INTRAMUSCULAR | Status: AC
  Filled 2019-12-03: qty 2

## 2019-12-03 NOTE — Procedures (Signed)
Interventional Radiology Procedure Note  Procedure: US guided liver mass biopsy.  Complications: None Recommendations:  - Ok to shower tomorrow - Do not submerge - Routine care   Signed,  Dulcy Fanny. Earleen Newport, DO

## 2019-12-03 NOTE — Anesthesia Preprocedure Evaluation (Addendum)
Anesthesia Evaluation  Patient identified by MRN, date of birth, ID band Patient awake    Reviewed: Allergy & Precautions, NPO status , Patient's Chart, lab work & pertinent test results  History of Anesthesia Complications (+) DIFFICULT AIRWAY  Airway Mallampati: II  TM Distance: >3 FB Neck ROM: Full    Dental no notable dental hx. (+) Teeth Intact, Dental Advisory Given, Poor Dentition   Pulmonary pneumonia,    Pulmonary exam normal breath sounds clear to auscultation       Cardiovascular Exercise Tolerance: Good hypertension, Pt. on medications Normal cardiovascular exam Rhythm:Regular Rate:Normal     Neuro/Psych CVA, Residual Symptoms    GI/Hepatic Neg liver ROS, GERD  ,  Endo/Other  diabetes, Type 2  Renal/GU      Musculoskeletal negative musculoskeletal ROS (+)   Abdominal   Peds negative pediatric ROS (+)  Hematology  (+) anemia ,   Anesthesia Other Findings   Reproductive/Obstetrics                          Anesthesia Physical Anesthesia Plan  ASA: III  Anesthesia Plan: General   Post-op Pain Management:    Induction: Intravenous  PONV Risk Score and Plan: Treatment may vary due to age or medical condition  Airway Management Planned: Oral ETT  Additional Equipment: None  Intra-op Plan:   Post-operative Plan: Extubation in OR  Informed Consent: I have reviewed the patients History and Physical, chart, labs and discussed the procedure including the risks, benefits and alternatives for the proposed anesthesia with the patient or authorized representative who has indicated his/her understanding and acceptance.   Patient has DNR.  Suspend DNR.   Dental advisory given  Plan Discussed with:   Anesthesia Plan Comments:       Anesthesia Quick Evaluation

## 2019-12-03 NOTE — Progress Notes (Signed)
PROGRESS NOTE    Shirley Sanders  SWF:[id number] DOB: 23-Oct-1934 DOA: 11/29/2019 PCP: Mellody Dance, DO   Brief Narrative:  Patient is 85 year old female with history of CVA, type 2diabetes mellitus, hypertension, hyperlipidemia who presents to the emergency department for the evaluation of back pain and jaundice.  Reported 2 to 3 weeks of ongoing lower back pain with radiation across her left flank to the upper abdomen/epigastric region.  Also reported poor oral intake and stated that her skin has appeared yellow.  Had some nausea.  She has chronic intermittent loose stools and was feeling weak.  On presentation she was hemodynamically stable.  Bilirubin was elevated at 13.3 with elevated liver enzymes, ALP of 372.  Chest x-ray showed unchanged punctuate calcified granuloma of left upper lobe.  CT abdomen/pelvis without contrast showed multiple poorly defined hepatic lesions concerning for metastatic disease to the liver, multiple small nodules throughout the visualized lung bases, mildly enlarged mesorectal lymph node, nonobstructive calculi in the right renal collecting system, moderate size hiatal hernia.  CT lumbar spine showed widespread degenerative lumbar spinal stenosis.  Ultrasound of the abdomen shows intra and extrahepatic biliary dilation, dilated CBD.  Oncology and GI consulted. Underwent liver biopsy on 12/03/2019 by IR.  Plan for EUS/ERCP +/- Stent placement on Friday by GI.  Assessment & Plan:   Active Problems:   Hypertension associated with diabetes ([place])   Diabetes mellitus, type 2 ([place])   History of CVA with residual deficit   Hyperlipidemia associated with type 2 diabetes mellitus ([place])   Metastatic disease ([place])   Hyperbilirubinemia   Jaundice   Protein-calorie malnutrition, severe   Hyperbilirubinemia/metastatic liver lesions:  Suspected metastatic disease of unknown primary.  CT imaging as above.  Oncology following.  Ultrasound of the abdomen as above. Underwent  liver biopsy on 12/03/19.  CT chest with contrast for stagign showed numerous subcentimeter bilateral pulmonary nodules.  GI also following with plan for EUS/ERCP +/- Stent placement on Friday  Type 2 diabetes mellitus: On Actos at home.  Continue sliding scale insulin here.  Monitor CBGs  History of CVA: Continue statin.  Aspirin on hold for potential biopsy.  She has mild left residual hemiparesis.  She is ambulatory on baseline without  a walker or cane.  She lives by herself.We will request for PT/OT evaluation  Hypertension: Currently normotensive.  Home chlorthalidone and lisinopril on hold  Hyperlipidemia: On statin .  We will hold because of elevated liver enzymes, hyperbilirubinemia.We will resume on DC.  Right thyroid nodule: CT scan showed indeterminate right thyroid nodule of 1.8 cm we will check ultrasound of the thyroid.  Normal TSH  Debility/deconditioning: We will request a PT/OT evaluation.  She is confused on and off.  Continue supportive care.    Nutrition Problem: Severe Malnutrition Etiology: acute illness(newly diagnosed metastatic disease)      DVT prophylaxis:SCD Code Status: DNR Family Communication: Discussed with daughter at bedside on 12/02/2019  disposition Plan: Patient is from home.  She is not stable for discharge and is waiting for further work-up.  Needs PT/OT evaluation before discharge   Consultants: GI, oncology  Procedures: None  Antimicrobials:  Anti-infectives (From admission, onward)   None      Subjective: Patient seen and examined at the bedside this morning.  Hemodynamically stable.  Looks comfortable.  She thinks she is at home today.  Not agitated or delirious but pleasantly confused.  Denies any complaints.  Objective: Vitals:   12/03/19 1105 12/03/19 1155 12/03/19 1206 12/03/19 1244  BP: (!) 126/92 117/60 118/68 105/63  Pulse: 97 87 89 85  Resp: 14 16 15    Temp:      TempSrc:      SpO2: 99% 100% 99%   Weight:        Height:        Intake/Output Summary (Last 24 hours) at 12/03/2019 1324 Last data filed at 12/03/2019 0900 Gross per 24 hour  Intake 60 ml  Output 30 ml  Net 30 ml   Filed Weights   11/29/19 2222 12/01/19 2205 12/02/19 2025  Weight: 44.5 kg 45 kg 45.5 kg    Examination:   General exam: Appears calm and comfortable ,Not in distress, pleasant elderly female Respiratory system: Bilateral equal air entry, normal vesicular breath sounds, no wheezes or crackles  Cardiovascular system: S1 & S2 heard, RRR. No JVD, murmurs, rubs, gallops or clicks. Gastrointestinal system: Abdomen is nondistended, soft and nontender. No organomegaly or masses felt. Normal bowel sounds heard. Central nervous system: Alert and awake.  Not oriented to place.  Told correct month. No focal neurological deficits. Extremities: No edema, no clubbing ,no cyanosis, distal peripheral pulses palpable. Skin: Icterus  Data Reviewed: I have personally reviewed following labs and imaging studies  CBC: Recent Labs  Lab 11/29/19 1530 11/30/19 0405 12/01/19 0459  WBC 11.7* 8.9 8.1  NEUTROABS  --   --  6.2  HGB 11.1* 8.6* 9.4*  HCT 34.4* 27.5* 29.7*  MCV 80.8 81.1 81.8  PLT 412* 284 638   Basic Metabolic Panel: Recent Labs  Lab 11/29/19 1530 11/30/19 0405 12/01/19 0459 12/02/19 0439 12/03/19 0434  NA 132* 131* 136 132* 134*  K 4.6 4.3 4.0 4.2 3.7  CL 92* 94* 98 95* 97*  CO2 21* 25 27 22 24   GLUCOSE 132* 98 80 165* 118*  BUN 21 18 13 15 18   CREATININE 1.07* 0.92 0.95 1.02* 0.97  CALCIUM 8.9 8.4* 8.6* 8.3* 8.4*   GFR: Estimated Creatinine Clearance: 32.1 mL/min (by C-G formula based on SCr of 0.97 mg/dL). Liver Function Tests: Recent Labs  Lab 11/29/19 1530 11/30/19 0405 12/01/19 0459 12/02/19 0439 12/03/19 0434  AST 112* 80* 107* 91* 93*  ALT 58* 44 56* 60* 64*  ALKPHOS 372* 275* 290* 296* 317*  BILITOT 13.3* 10.1* 11.4* 12.1* 11.3*  PROT 6.8 5.2* 5.4* 5.6* 5.3*  ALBUMIN 2.4* 1.9* 1.9* 2.0*  2.1*   Recent Labs  Lab 11/29/19 1530  LIPASE 30   No results for input(s): AMMONIA in the last 168 hours. Coagulation Profile: Recent Labs  Lab 12/01/19 1016 12/02/19 0727 12/03/19 0434  INR 1.6* 1.7* 1.6*   Cardiac Enzymes: No results for input(s): CKTOTAL, CKMB, CKMBINDEX, TROPONINI in the last 168 hours. BNP (last 3 results) No results for input(s): PROBNP in the last 8760 hours. HbA1C: No results for input(s): HGBA1C in the last 72 hours. CBG: Recent Labs  Lab 12/02/19 1126 12/02/19 1658 12/02/19 2022 12/03/19 0657 12/03/19 1217  GLUCAP 146* 158* 107* 116* 102*   Lipid Profile: No results for input(s): CHOL, HDL, LDLCALC, TRIG, CHOLHDL, LDLDIRECT in the last 72 hours. Thyroid Function Tests: Recent Labs    12/02/19 1216  TSH 0.719   Anemia Panel: No results for input(s): VITAMINB12, FOLATE, FERRITIN, TIBC, IRON, RETICCTPCT in the last 72 hours. Sepsis Labs: No results for input(s): PROCALCITON, LATICACIDVEN in the last 168 hours.  Recent Results (from the past 240 hour(s))  SARS CORONAVIRUS 2 (TAT 6-24 HRS) Nasopharyngeal Nasopharyngeal Swab     Status:  None   Collection Time: 11/29/19  7:52 PM   Specimen: Nasopharyngeal Swab  Result Value Ref Range Status   SARS Coronavirus 2 NEGATIVE NEGATIVE Final    Comment: (NOTE) SARS-CoV-2 target nucleic acids are NOT DETECTED. The SARS-CoV-2 RNA is generally detectable in upper and lower respiratory specimens during the acute phase of infection. Negative results do not preclude SARS-CoV-2 infection, do not rule out co-infections with other pathogens, and should not be used as the sole basis for treatment or other patient management decisions. Negative results must be combined with clinical observations, patient history, and epidemiological information. The expected result is Negative. Fact Sheet for Patients: SugarRoll.be Fact Sheet for Healthcare  Providers: https://www.woods-mathews.com/ This test is not yet approved or cleared by the [place] FDA and  has been authorized for detection and/or diagnosis of SARS-CoV-2 by FDA under an Emergency Use Authorization (EUA). This EUA will remain  in effect (meaning this test can be used) for the duration of the COVID-19 declaration under Section 56 4(b)(1) of the Act, 21 U.S.C. section 360bbb-3(b)(1), unless the authorization is terminated or revoked sooner. Performed at [facility], [place] [address], [place], [place] [address]          Radiology Studies: CT CHEST W CONTRAST  Result Date: 12/01/2019 CLINICAL DATA:  Diffuse liver metastases with unknown primary EXAM: CT CHEST WITH CONTRAST TECHNIQUE: Multidetector CT imaging of the chest was performed during intravenous contrast administration. CONTRAST:  62mL OMNIPAQUE IOHEXOL 300 MG/ML  SOLN COMPARISON:  11/29/2019 FINDINGS: Cardiovascular: Heart is unremarkable without pericardial effusion. Thoracic aorta is normal in caliber without dissection. Minimal atherosclerosis of the aortic arch and coronary vasculature. Mediastinum/Nodes: No pathologic adenopathy. Heterogeneous 1.8 cm right lobe thyroid nodule with indeterminate calcifications. Small hiatal hernia. Lungs/Pleura: There are numerous subcentimeter pulmonary nodules. Index nodules are as follows: Right lower lobe, image 92, 9 mm. Left lower lobe, image 66, 7 mm. Right upper lobe, image 70, 6 mm. No effusion or pneumothorax.  The central airways are patent. Upper Abdomen: Numerous hypodense masses throughout the liver consistent with metastatic disease. Likely lymphadenopathy at the porta hepatis, incompletely evaluated. Musculoskeletal: There are no acute bony abnormalities. Chronic T12 compression fracture with prior vertebral augmentation. Reconstructed images demonstrate no additional findings. IMPRESSION: 1. Diffuse metastatic disease throughout the liver, with  likely adenopathy at the porta hepatis incompletely evaluated. Please see previous CT abdomen 11/29/2019. The liver lesions are amenable to percutaneous biopsy if tissue diagnosis is desired. 2. Numerous subcentimeter bilateral pulmonary nodules, index lesions provided above. Nodules are too small for percutaneous sampling, and borderline for detection by PET scan. 3. Indeterminate 1.8 cm right lobe thyroid nodule. Recommend thyroid US. (Ref: J Am Coll Radiol. 2015 Feb;12(2): 143-50). Electronically Signed   By: Randa Ngo M.D.   On: 12/01/2019 23:15   US BIOPSY (LIVER)  Result Date: 12/03/2019 INDICATION: 85 year old female with a history liver masses and elevated liver enzymes EXAM: ULTRASOUND-GUIDED BIOPSY OF LIVER MASS MEDICATIONS: None. ANESTHESIA/SEDATION: Moderate (conscious) sedation was employed during this procedure. A total of Versed 0.5 mg and Fentanyl 25 mcg was administered intravenously. Moderate Sedation Time: 10 minutes. The patient's level of consciousness and vital signs were monitored continuously by radiology nursing throughout the procedure under my direct supervision. FLUOROSCOPY TIME:  NONE COMPLICATIONS: NONE PROCEDURE: Informed written consent was obtained from the patient after a thorough discussion of the procedural risks, benefits and alternatives. All questions were addressed. Maximal Sterile Barrier Technique was utilized including caps, mask, sterile gowns, sterile  gloves, sterile drape, hand hygiene and skin antiseptic. A timeout was performed prior to the initiation of the procedure. Patient positioned supine position on the ultrasound stretcher. Images were stored sent to PACs. Patient is prepped and draped in the usual sterile fashion. 1% lidocaine was used for local anesthesia. Using ultrasound guidance guide needle was advanced into the right-sided liver mass. Once we confirmed needle tip position multiple 18 gauge core biopsy were achieved. Gel-Foam pledgets were  infused. Needle was removed and final image was stored. Tissue specimen placed into formalin.  Sterile bandage was placed. Patient tolerated the procedure well and remained hemodynamically stable throughout. No complications were encountered and no significant blood loss. IMPRESSION: Status post ultrasound-guided biopsy of right liver mass. Tissue specimen sent to pathology for complete histopathologic analysis. Signed, Dulcy Fanny. Dellia Nims, RPVI [facility] Radiology Electronically Signed   By: Corrie Mckusick D.O.   On: 12/03/2019 12:59        Scheduled Meds: . fentaNYL      . gelatin adsorbable      . insulin aspart  0-9 Units Subcutaneous TID WC  . lactose free nutrition  237 mL Oral TID WC  . lidocaine (PF)      . midazolam      . multivitamin with minerals  1 tablet Oral Daily   Continuous Infusions:    LOS: 3 days    Time spent: 35 mins.More than 50% of that time was spent in counseling and/or coordination of care.      Shelly Coss, MD [facility] P2/25/2021, 1:24 PM

## 2019-12-04 ENCOUNTER — Inpatient Hospital Stay (HOSPITAL_COMMUNITY): Payer: Medicare Other

## 2019-12-04 ENCOUNTER — Inpatient Hospital Stay (HOSPITAL_COMMUNITY): Payer: Medicare Other | Admitting: Certified Registered Nurse Anesthetist

## 2019-12-04 ENCOUNTER — Encounter (HOSPITAL_COMMUNITY)
Admission: EM | Disposition: A | Discharge status: Hospice | Payer: Self-pay | Source: Home / Self Care | Attending: Internal Medicine

## 2019-12-04 ENCOUNTER — Ambulatory Visit: Payer: Medicare Other | Admitting: Family Medicine

## 2019-12-04 ENCOUNTER — Encounter (HOSPITAL_COMMUNITY): Payer: Self-pay | Admitting: Internal Medicine

## 2019-12-04 DIAGNOSIS — Z515 Encounter for palliative care: Secondary | ICD-10-CM

## 2019-12-04 DIAGNOSIS — Z7189 Other specified counseling: Secondary | ICD-10-CM

## 2019-12-04 DIAGNOSIS — Z66 Do not resuscitate: Secondary | ICD-10-CM

## 2019-12-04 HISTORY — PX: BILIARY STENT PLACEMENT: SHX5538

## 2019-12-04 HISTORY — PX: ESOPHAGOGASTRODUODENOSCOPY (EGD) WITH PROPOFOL: SHX5813

## 2019-12-04 HISTORY — PX: EUS: SHX5427

## 2019-12-04 HISTORY — PX: SPHINCTEROTOMY: SHX5544

## 2019-12-04 HISTORY — PX: ERCP: SHX5425

## 2019-12-04 LAB — COMPREHENSIVE METABOLIC PANEL
ALT: 69 U/L — ABNORMAL HIGH (ref 0–44)
AST: 96 U/L — ABNORMAL HIGH (ref 15–41)
Albumin: 1.9 g/dL — ABNORMAL LOW (ref 3.5–5.0)
Alkaline Phosphatase: 303 U/L — ABNORMAL HIGH (ref 38–126)
Anion gap: 12 (ref 5–15)
BUN: 18 mg/dL (ref 8–23)
CO2: 26 mmol/L (ref 22–32)
Calcium: 8.3 mg/dL — ABNORMAL LOW (ref 8.9–10.3)
Chloride: 99 mmol/L (ref 98–111)
Creatinine, Ser: 0.85 mg/dL (ref 0.44–1.00)
GFR calc Af Amer: >60 mL/min (ref >60)
GFR calc non Af Amer: >60 mL/min (ref >60)
Glucose, Bld: 102 mg/dL — ABNORMAL HIGH (ref 70–99)
Potassium: 3.6 mmol/L (ref 3.5–5.1)
Sodium: 137 mmol/L (ref 135–145)
Total Bilirubin: 11.3 mg/dL — ABNORMAL HIGH (ref 0.3–1.2)
Total Protein: 5.1 g/dL — ABNORMAL LOW (ref 6.5–8.1)

## 2019-12-04 LAB — GLUCOSE, CAPILLARY
Glucose-Capillary: 108 mg/dL — ABNORMAL HIGH (ref 70–99)
Glucose-Capillary: 102 mg/dL — ABNORMAL HIGH (ref 70–99)
Glucose-Capillary: 101 mg/dL — ABNORMAL HIGH (ref 70–99)
Glucose-Capillary: 118 mg/dL — ABNORMAL HIGH (ref 70–99)
Glucose-Capillary: 108 mg/dL — ABNORMAL HIGH (ref 70–99)
Glucose-Capillary: 181 mg/dL — ABNORMAL HIGH (ref 70–99)
Glucose-Capillary: 145 mg/dL — ABNORMAL HIGH (ref 70–99)

## 2019-12-04 SURGERY — ERCP, WITH INTERVENTION IF INDICATED
Anesthesia: General

## 2019-12-04 MED ORDER — INDOMETHACIN 50 MG RE SUPP
RECTAL | Status: AC
  Filled 2019-12-04: qty 2

## 2019-12-04 MED ORDER — FENTANYL CITRATE (PF) 100 MCG/2ML IJ SOLN
INTRAMUSCULAR | Status: DC | PRN
  Administered 2019-12-04 (×2): 25 ug via INTRAVENOUS

## 2019-12-04 MED ORDER — LACTATED RINGERS IV SOLN
INTRAVENOUS | Status: DC
  Administered 2019-12-04: 1000 mL via INTRAVENOUS

## 2019-12-04 MED ORDER — SODIUM CHLORIDE 0.9 % IV SOLN: INTRAVENOUS | Status: DC

## 2019-12-04 MED ORDER — CIPROFLOXACIN IN D5W 400 MG/200ML IV SOLN
INTRAVENOUS | Status: AC
  Filled 2019-12-04: qty 200

## 2019-12-04 MED ORDER — PHENYLEPHRINE 40 MCG/ML (10ML) SYRINGE FOR IV PUSH (FOR BLOOD PRESSURE SUPPORT)
PREFILLED_SYRINGE | INTRAVENOUS | Status: DC | PRN
  Administered 2019-12-04: 120 ug via INTRAVENOUS
  Administered 2019-12-04 (×2): 80 ug via INTRAVENOUS

## 2019-12-04 MED ORDER — PHENYLEPHRINE HCL-NACL 10-0.9 MG/250ML-% IV SOLN
INTRAVENOUS | Status: DC | PRN
  Administered 2019-12-04: 25 ug/min via INTRAVENOUS

## 2019-12-04 MED ORDER — SUCCINYLCHOLINE CHLORIDE 20 MG/ML IJ SOLN: INTRAVENOUS | Status: DC | PRN

## 2019-12-04 MED ORDER — CIPROFLOXACIN IN D5W 400 MG/200ML IV SOLN
INTRAVENOUS | Status: DC | PRN
  Administered 2019-12-04: 400 mg via INTRAVENOUS

## 2019-12-04 MED ORDER — SUCCINYLCHOLINE CHLORIDE 200 MG/10ML IV SOSY
PREFILLED_SYRINGE | INTRAVENOUS | Status: DC | PRN
  Administered 2019-12-04: 40 mg via INTRAVENOUS

## 2019-12-04 MED ORDER — PROPOFOL 10 MG/ML IV BOLUS
INTRAVENOUS | Status: DC | PRN
  Administered 2019-12-04: 50 mg via INTRAVENOUS
  Administered 2019-12-04: 20 mg via INTRAVENOUS

## 2019-12-04 MED ORDER — LIDOCAINE 2% (20 MG/ML) 5 ML SYRINGE
INTRAMUSCULAR | Status: DC | PRN
  Administered 2019-12-04: 50 mg via INTRAVENOUS

## 2019-12-04 MED ORDER — INDOMETHACIN 50 MG RE SUPP
RECTAL | Status: DC | PRN
  Administered 2019-12-04: 100 mg via RECTAL

## 2019-12-04 MED ORDER — GLUCAGON HCL RDNA (DIAGNOSTIC) 1 MG IJ SOLR
INTRAMUSCULAR | Status: AC
  Filled 2019-12-04: qty 1

## 2019-12-04 MED ORDER — SODIUM CHLORIDE 0.9 % IV SOLN
INTRAVENOUS | Status: DC | PRN
  Administered 2019-12-04: 15:00:00 10 mL

## 2019-12-04 MED ORDER — ONDANSETRON HCL 4 MG/2ML IJ SOLN
INTRAMUSCULAR | Status: DC | PRN
  Administered 2019-12-04: 4 mg via INTRAVENOUS

## 2019-12-04 NOTE — Op Note (Signed)
[facility] Patient Name: Shirley Sanders Procedure Date : 12/04/2019 MRN: [id number] Attending MD: Carol Ada , MD Date of Birth: Apr 16, 1935 CSN: [id number] Age: 85 Admit Type: Inpatient Procedure:                Upper EUS Indications:              Common bile duct dilation (acquired) seen on CT scan Providers:                Carol Ada, MD, Marguerita Merles, Technician,                            Theodora Blow, Technician, Carlyn Reichert, RN Referring MD:              Medicines:                General Anesthesia Complications:            No immediate complications. Estimated Blood Loss:     Estimated blood loss: none. Procedure:                Pre-Anesthesia Assessment:                           - Prior to the procedure, a History and Physical                            was performed, and patient medications and                            allergies were reviewed. The patient's tolerance of                            previous anesthesia was also reviewed. The risks                            and benefits of the procedure and the sedation                            options and risks were discussed with the patient.                            All questions were answered, and informed consent                            was obtained. Prior Anticoagulants: The patient has                            taken no previous anticoagulant or antiplatelet                            agents. ASA Grade Assessment: III - A patient with                            severe systemic disease. After reviewing the risks  and benefits, the patient was deemed in                            satisfactory condition to undergo the procedure.                           - Sedation was administered by an anesthesia                            professional. General anesthesia was attained.                           After obtaining informed consent, the endoscope was         passed under direct vision. Throughout the                            procedure, the patient's blood pressure, pulse, and                            oxygen saturations were monitored continuously. The                            [id number] ([id number]) Olympus Linear EUS scope was                            introduced through the mouth, and advanced to the                            second part of duodenum. The upper EUS was                            accomplished without difficulty. The patient                            tolerated the procedure well. Scope In: Scope Out: Findings:      ENDOSCOPIC FINDING: :      The examined esophagus was endoscopically normal.      The entire examined stomach was endoscopically normal.      The examined duodenum was endoscopically normal.      ENDOSONOGRAPHIC FINDING: :      A homogenous mass was identified endosonographically in the lower third       of the main bile duct.      The CBD was dilated in the mid and proximal CBD. It measured 12 mm. In       the distal third of the duct, a 7.4 mm mass was identified, but this was       only the proximal portion of the lesion. It was not clear where the       lesion extended distally. In the head of the pancreas there was no clear       evidence of a mass. An FNA was not performed in the head of the pancreas       as the patient was already diagnosed with metastatic adenocarcinoma from       the liver biopsies. The mass did cause an abrupt narrowing of  the CBD       consistent with the cut off sign identified by the CT scan. A 40 mm x 10       mm stent was deployed in the distal CBD. Placement was adequate as the       stent did migrate distally during the deployment process. The proximal       edge of the stent was just above the cut off portion of the CBD. Impression:               - Normal esophagus.                           - Normal stomach.                           - Normal examined duodenum.                            - A mass was found in the lower third of the main                            bile duct.                           - No specimens collected. Recommendation:           - Convert the procedure to an ERCP. Procedure Code(s):        --- Professional ---                           [phone], Esophagogastroduodenoscopy, flexible,                            transoral; with endoscopic ultrasound examination                            limited to the esophagus, stomach or duodenum, and                            adjacent structures Diagnosis Code(s):        --- Professional ---                           K83.8, Other specified diseases of biliary tract CPT copyright 2019 [employer]. All rights reserved. The codes documented in this report are preliminary and upon coder review may  be revised to meet current compliance requirements. Carol Ada, MD Carol Ada, MD 12/04/2019 2:46:03 PM This report has been signed electronically. Number of Addenda: 0

## 2019-12-04 NOTE — Progress Notes (Signed)
PROGRESS NOTE    Shirley Sanders  OHY:[id number] DOB: 1935/03/13 DOA: 11/29/2019 PCP: Mellody Dance, DO   Brief Narrative:  Patient is 85 year old female with history of CVA, type 2diabetes mellitus, hypertension, hyperlipidemia who presents to the emergency department for the evaluation of back pain and jaundice.  Reported 2 to 3 weeks of ongoing lower back pain with radiation across her left flank to the upper abdomen/epigastric region.  Also reported poor oral intake and stated that her skin has appeared yellow.  Had some nausea.  She has chronic intermittent loose stools and was feeling weak.  On presentation she was hemodynamically stable.  Bilirubin was elevated at 13.3 with elevated liver enzymes, ALP of 372.  Chest x-ray showed unchanged punctuate calcified granuloma of left upper lobe.  CT abdomen/pelvis without contrast showed multiple poorly defined hepatic lesions concerning for metastatic disease to the liver, multiple small nodules throughout the visualized lung bases, mildly enlarged mesorectal lymph node, nonobstructive calculi in the right renal collecting system, moderate size hiatal hernia.  CT lumbar spine showed widespread degenerative lumbar spinal stenosis.  Ultrasound of the abdomen shows intra and extrahepatic biliary dilation, dilated CBD.  Oncology and GI consulted. Underwent liver biopsy on 12/03/2019 by IR.  Plan for EUS/ERCP +/- Stent placement on on 12/04/19  by GI.  Assessment & Plan:   Active Problems:   Hypertension associated with diabetes ([place])   Diabetes mellitus, type 2 ([place])   History of CVA with residual deficit   Hyperlipidemia associated with type 2 diabetes mellitus ([place])   Metastatic disease ([place])   Hyperbilirubinemia   Jaundice   Protein-calorie malnutrition, severe   Hyperbilirubinemia/metastatic liver lesions:  Suspected metastatic disease of unknown primary.  CT imaging as above.  Oncology following.  Ultrasound of the abdomen as  above. Underwent liver biopsy on 12/03/19.  CT chest with contrast for staging showed numerous subcentimeter bilateral pulmonary nodules.  GI also following with plan for EUS/ERCP +/- Stent placement on today. I had a long conversation with daughter at the bedside today.  Given patient's advanced age, dementia, daughter is not very interested to pursue further aggressive treatment for her liver cancer.  She is interested to learn about palliative approach.  I have requested for palliative care evaluation.  She is a candidate for hospice services.  Type 2 diabetes mellitus: On Actos at home.  Continue sliding scale insulin here.  Monitor CBGs  History of CVA: Continue statin.  Aspirin on hold for potential biopsy.  She has mild left residual hemiparesis.  She is ambulatory on baseline without  a walker or cane.  She lives by herself.We will request for PT/OT evaluation  Hypertension: Currently normotensive.  Home chlorthalidone and lisinopril on hold  Hyperlipidemia: On statin .  We will hold because of elevated liver enzymes, hyperbilirubinemia.We will resume on DC.  Right thyroid nodule: CT scan showed indeterminate right thyroid nodule of 1.8 cm .  Normal TSH.  Ultrasound of the thyroid showed hypoechoic 2.9 cm solid right  nodule with microcalcification in the right lower lobe.  Discussed with the daughter, and she does not want to pursue further investigation.  Debility/deconditioning: We will request a PT/OT evaluation.  She is confused on and off.  Continue supportive care.    Nutrition Problem: Severe Malnutrition Etiology: acute illness(newly diagnosed metastatic disease)      DVT prophylaxis:SCD Code Status: DNR Family Communication: Discussed with daughter at bedside on 12/04/2019  disposition Plan: Patient is from home.  She is not stable  for discharge and is waiting for further work-up.  Needs PT/OT evaluation before discharge   Consultants: GI, oncology  Procedures:  None  Antimicrobials:  Anti-infectives (From admission, onward)   None      Subjective: Patient seen and examined at the bedside this morning.  Hemodynamically stable.  Sitting on the chair.  Pleasantly confused.  Not in distress  Objective: Vitals:   12/03/19 1530 12/03/19 1600 12/03/19 2212 12/04/19 0543  BP: 112/70 (!) 105/55 116/64 114/71  Pulse: 86 68 86 80  Resp:   16   Temp:   98.3 F (36.8 C) 98.8 F (37.1 C)  TempSrc:   Oral Oral  SpO2:   97% 97%  Weight:      Height:        Intake/Output Summary (Last 24 hours) at 12/04/2019 0817 Last data filed at 12/03/2019 1754 Gross per 24 hour  Intake 200 ml  Output --  Net 200 ml   Filed Weights   11/29/19 2222 12/01/19 2205 12/02/19 2025  Weight: 44.5 kg 45 kg 45.5 kg    Examination:   General exam: Not in distress, deconditioned, debilitated  Respiratory system: Bilateral equal air entry, normal vesicular breath sounds, no wheezes or crackles  Cardiovascular system: S1 & S2 heard, RRR. No JVD, murmurs, rubs, gallops or clicks. Gastrointestinal system: Abdomen is nondistended, soft and nontender. No organomegaly or masses felt. Normal bowel sounds heard. Central nervous system: Alert and awake but not oriented extremities: No edema, no clubbing ,no cyanosis, distal peripheral pulses palpable. Skin: Icterus    Data Reviewed: I have personally reviewed following labs and imaging studies  CBC: Recent Labs  Lab 11/29/19 1530 11/30/19 0405 12/01/19 0459  WBC 11.7* 8.9 8.1  NEUTROABS  --   --  6.2  HGB 11.1* 8.6* 9.4*  HCT 34.4* 27.5* 29.7*  MCV 80.8 81.1 81.8  PLT 412* 284 154   Basic Metabolic Panel: Recent Labs  Lab 11/30/19 0405 12/01/19 0459 12/02/19 0439 12/03/19 0434 12/04/19 0548  NA 131* 136 132* 134* 137  K 4.3 4.0 4.2 3.7 3.6  CL 94* 98 95* 97* 99  CO2 25 27 22 24 26   GLUCOSE 98 80 165* 118* 102*  BUN 18 13 15 18 18   CREATININE 0.92 0.95 1.02* 0.97 0.85  CALCIUM 8.4* 8.6* 8.3* 8.4*  8.3*   GFR: Estimated Creatinine Clearance: 36.7 mL/min (by C-G formula based on SCr of 0.85 mg/dL). Liver Function Tests: Recent Labs  Lab 11/30/19 0405 12/01/19 0459 12/02/19 0439 12/03/19 0434 12/04/19 0548  AST 80* 107* 91* 93* 96*  ALT 44 56* 60* 64* 69*  ALKPHOS 275* 290* 296* 317* 303*  BILITOT 10.1* 11.4* 12.1* 11.3* 11.3*  PROT 5.2* 5.4* 5.6* 5.3* 5.1*  ALBUMIN 1.9* 1.9* 2.0* 2.1* 1.9*   Recent Labs  Lab 11/29/19 1530  LIPASE 30   No results for input(s): AMMONIA in the last 168 hours. Coagulation Profile: Recent Labs  Lab 12/01/19 1016 12/02/19 0727 12/03/19 0434  INR 1.6* 1.7* 1.6*   Cardiac Enzymes: No results for input(s): CKTOTAL, CKMB, CKMBINDEX, TROPONINI in the last 168 hours. BNP (last 3 results) No results for input(s): PROBNP in the last 8760 hours. HbA1C: No results for input(s): HGBA1C in the last 72 hours. CBG: Recent Labs  Lab 12/03/19 1632 12/03/19 2214 12/04/19 0158 12/04/19 0538 12/04/19 0721  GLUCAP 118* 127* 108* 102* 101*   Lipid Profile: No results for input(s): CHOL, HDL, LDLCALC, TRIG, CHOLHDL, LDLDIRECT in the last 72 hours.  Thyroid Function Tests: Recent Labs    12/02/19 1216  TSH 0.719   Anemia Panel: No results for input(s): VITAMINB12, FOLATE, FERRITIN, TIBC, IRON, RETICCTPCT in the last 72 hours. Sepsis Labs: No results for input(s): PROCALCITON, LATICACIDVEN in the last 168 hours.  Recent Results (from the past 240 hour(s))  SARS CORONAVIRUS 2 (TAT 6-24 HRS) Nasopharyngeal Nasopharyngeal Swab     Status: None   Collection Time: 11/29/19  7:52 PM   Specimen: Nasopharyngeal Swab  Result Value Ref Range Status   SARS Coronavirus 2 NEGATIVE NEGATIVE Final    Comment: (NOTE) SARS-CoV-2 target nucleic acids are NOT DETECTED. The SARS-CoV-2 RNA is generally detectable in upper and lower respiratory specimens during the acute phase of infection. Negative results do not preclude SARS-CoV-2 infection, do not rule  out co-infections with other pathogens, and should not be used as the sole basis for treatment or other patient management decisions. Negative results must be combined with clinical observations, patient history, and epidemiological information. The expected result is Negative. Fact Sheet for Patients: SugarRoll.be Fact Sheet for Healthcare Providers: https://www.woods-mathews.com/ This test is not yet approved or cleared by the [place] FDA and  has been authorized for detection and/or diagnosis of SARS-CoV-2 by FDA under an Emergency Use Authorization (EUA). This EUA will remain  in effect (meaning this test can be used) for the duration of the COVID-19 declaration under Section 56 4(b)(1) of the Act, 21 U.S.C. section 360bbb-3(b)(1), unless the authorization is terminated or revoked sooner. Performed at [facility], [place] [address], [place],  [address]          Radiology Studies: US BIOPSY (LIVER)  Result Date: 12/03/2019 INDICATION: 85 year old female with a history liver masses and elevated liver enzymes EXAM: ULTRASOUND-GUIDED BIOPSY OF LIVER MASS MEDICATIONS: None. ANESTHESIA/SEDATION: Moderate (conscious) sedation was employed during this procedure. A total of Versed 0.5 mg and Fentanyl 25 mcg was administered intravenously. Moderate Sedation Time: 10 minutes. The patient's level of consciousness and vital signs were monitored continuously by radiology nursing throughout the procedure under my direct supervision. FLUOROSCOPY TIME:  NONE COMPLICATIONS: NONE PROCEDURE: Informed written consent was obtained from the patient after a thorough discussion of the procedural risks, benefits and alternatives. All questions were addressed. Maximal Sterile Barrier Technique was utilized including caps, mask, sterile gowns, sterile gloves, sterile drape, hand hygiene and skin antiseptic. A timeout was performed prior to the initiation  of the procedure. Patient positioned supine position on the ultrasound stretcher. Images were stored sent to PACs. Patient is prepped and draped in the usual sterile fashion. 1% lidocaine was used for local anesthesia. Using ultrasound guidance guide needle was advanced into the right-sided liver mass. Once we confirmed needle tip position multiple 18 gauge core biopsy were achieved. Gel-Foam pledgets were infused. Needle was removed and final image was stored. Tissue specimen placed into formalin.  Sterile bandage was placed. Patient tolerated the procedure well and remained hemodynamically stable throughout. No complications were encountered and no significant blood loss. IMPRESSION: Status post ultrasound-guided biopsy of right liver mass. Tissue specimen sent to pathology for complete histopathologic analysis. Signed, Dulcy Fanny. Dellia Nims, RPVI [facility] Radiology Electronically Signed   By: Corrie Mckusick D.O.   On: 12/03/2019 12:59   US THYROID  Result Date: 12/03/2019 CLINICAL DATA:  Goiter. EXAM: THYROID ULTRASOUND TECHNIQUE: Ultrasound examination of the thyroid gland and adjacent soft tissues was performed. COMPARISON:  None FINDINGS: Parenchymal Echotexture: Mildly heterogenous Isthmus: Normal in  size measures 0.2 cm in diameter Right lobe: Normal in size measuring 4.4 x 1.8 x 1.6 cm Left lobe: Slightly atrophic in size measuring 3.0 x 1.3 x 0.8 cm _________________________________________________________ Estimated total number of nodules >/= 1 cm: 1 Number of spongiform nodules >/=  2 cm not described below (TR1): 0 Number of mixed cystic and solid nodules >/= 1.5 cm not described below ([place]): 0 _________________________________________________________ Nodule # 1: Location: Right; Inferior Maximum size: 2.9 cm; Other 2 dimensions: 2.3 x 1.4 cm Composition: solid/almost completely solid (2) Echogenicity: hypoechoic (2) Shape: not taller-than-wide (0)  Margins: lobulated/irregular (2) Echogenic foci: macrocalcifications (1) ACR TI-RADS total points: 7. ACR TI-RADS risk category: TR5 (>/= 7 points). ACR TI-RADS recommendations: **Given size (>/= 1.0 cm) and appearance, fine needle aspiration of this highly suspicious nodule should be considered based on TI-RADS criteria. _________________________________________________________ There is an approximately 0.6 cm hypoechoic ill-defined nodule/pseudonodule within mid, medial aspect the left lobe of the thyroid (labeled 2), which does not meet criteria to recommend percutaneous sampling or continued dedicated follow-up. IMPRESSION: Nodule #1 within the mid/inferior aspect of the right lobe of the thyroid meets imaging criteria to recommend percutaneous sampling as clinically indicated. The above is in keeping with the ACR TI-RADS recommendations - J Am Coll Radiol 2017;14:587-595. Electronically Signed   By: Sandi Mariscal M.D.   On: 12/03/2019 13:46        Scheduled Meds: . insulin aspart  0-9 Units Subcutaneous TID WC  . lactose free nutrition  237 mL Oral TID WC  . multivitamin with minerals  1 tablet Oral Daily   Continuous Infusions:    LOS: 4 days    Time spent: 35 mins.More than 50% of that time was spent in counseling and/or coordination of care.      Shelly Coss, MD [facility] P2/26/2021, 8:17 AM

## 2019-12-04 NOTE — Interval H&P Note (Signed)
History and Physical Interval Note:  12/04/2019 1:29 PM  Shirley Sanders  has presented today for surgery, with the diagnosis of Billiary obstruction.  The various methods of treatment have been discussed with the patient and family. After consideration of risks, benefits and other options for treatment, the patient has consented to  Procedure(s): ENDOSCOPIC RETROGRADE CHOLANGIOPANCREATOGRAPHY (ERCP) (N/A) UPPER ENDOSCOPIC ULTRASOUND (EUS) LINEAR (N/A) as a surgical intervention.  The patient's history has been reviewed, patient examined, no change in status, stable for surgery.  I have reviewed the patient's chart and labs.  Questions were answered to the patient's satisfaction.     Chestine Belknap D

## 2019-12-04 NOTE — Plan of Care (Signed)
  Problem: Clinical Measurements: Goal: Diagnostic test results will improve Outcome: Progressing   Problem: Nutrition: Goal: Adequate nutrition will be maintained Outcome: Progressing   

## 2019-12-04 NOTE — Consult Note (Signed)
Consultation Note Date: 12/04/2019   Patient Name: Shirley Sanders  DOB: 1935-03-10  MRN: [id number]  Age / Sex: 85 y.o., female  PCP: Mellody Dance, DO Referring Physician: Shelly Coss, MD  Reason for Consultation: Establishing goals of care  HPI/Patient Profile:  Per hospitalist note --> Patient is 85 year old female with history of CVA, type 2 diabetes mellitus, hypertension, hyperlipidemia who presents to the emergency department for the evaluation of back pain and jaundice.  Reported 2 to 3 weeks of ongoing lower back pain with radiation across her left flank to the upper abdomen/epigastric region.  Also reported poor oral intake and stated that her skin has appeared yellow.  Had some nausea.  She has chronic intermittent loose stools and was feeling weak.  On presentation she was hemodynamically stable.  Bilirubin was elevated at 13.3 with elevated liver enzymes, ALP of 372.  Chest x-ray showed unchanged punctuate calcified granuloma of left upper lobe.  CT abdomen/pelvis without contrast showed multiple poorly defined hepatic lesions concerning for metastatic disease to the liver, multiple small nodules throughout the visualized lung bases, mildly enlarged mesorectal lymph node, nonobstructive calculi in the right renal collecting system, moderate size hiatal hernia.  CT lumbar spine showed widespread degenerative lumbar spinal stenosis.  Ultrasound of the abdomen shows intra and extrahepatic biliary dilation, dilated CBD.  Oncology and GI consulted.  Palliative care was consulted to speak with patient regarding her wishes for herself given her metastatic disease.   Clinical Assessment and Goals of Care: I have reviewed medical records including EPIC notes, labs and imaging, received report from bedside RN, assessed the patient.    I met with Shirley Sanders and her daughter, Shirley Sanders  to further discuss  diagnosis prognosis, GOC, EOL wishes, disposition and options.  I asked Terris to tell me more about what the medical team had shared with her regarding her disease process. She shared that she had cancer which was "not good." We discussed her liver disease and the spread of it throughout her body.    I introduced Palliative Medicine as specialized medical care for people living with serious illness. It focuses on providing relief from the symptoms and stress of a serious illness. The goal is to improve quality of life for both the patient and the family.  I asked Aranza to tell me a little about herself. Waylynn shared that she is from [facility] though she has move around the state. She was married for a short time though it was note a good marriage and her husband left her and her children. She has a son, Gerald Stabs and a daughter, Maudie Mercury. 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 She said that she absolutely loved her job. She said that she gets joy out of spending time with her family. She is a woman of faith and practices within the [facility] denomination.   A detailed discussion was had today regarding concepts specific to code status, artifical  feeding and hydration, continued IV antibiotics and rehospitalization was had.  Korinne said that she would not want cardiopulmonary resuscitation, intubation, feeding tubes, or hemodialysis.   The difference between a aggressive medical intervention path  and a palliative comfort care path for this patient at this time was had.  I introduced the idea of hospice in the setting of Kahlen's metastatic disease. She said that she had heard of hospice before. She shared that the medical team had told her that her diease was "bad" and she was aware of what to expect. We talked about reverting from a curative focus to a symptoms and comfort focus. We discussed how we cannot solve the  underlying problem, but we can focus on the time Netasha has left with the goal of making it a fruitful as possible. For Kellee this would include spending as much time with her children and grandchildren as possible. I asked if she would be willing to meet with hospice to consider enrolling in their services which she was amenable to.   Discussed with patient the importance of continued conversation with family and their  medical providers regarding overall plan of care and treatment options, ensuring decisions are within the context of the patients values and GOCs.  We plan to continue following Jazmyne and offering support as able.   Decision Maker: Shirley Sanders (Daughter) - [phone]   SUMMARY OF RECOMMENDATIONS   DNAR/DNI, no feeding tubes, no HD  TOC --> Consult for home hospice  Chaplain Consult  Code Status/Advance Care Planning:  DNR   Symptom Management:   Per primary  Palliative Prophylaxis:   Aspiration, Bowel Regimen, Delirium Protocol, Eye Care, Frequent Pain Assessment, Oral Care, Palliative Wound Care and Turn Reposition  Additional Recommendations (Limitations, Scope, Preferences):  Minimize Medications  Psycho-social/Spiritual:   Desire for further Chaplaincy support: Yes  Additional Recommendations: Education on Hospice  Prognosis:   < 6 months  Discharge Planning: Home with Hospice      Primary Diagnoses: Present on Admission: . Metastatic disease ([place]) . Hypertension associated with diabetes ([place]) . Hyperlipidemia associated with type 2 diabetes mellitus ([place])  I have reviewed the medical record, interviewed the patient and family, and examined the patient. The following aspects are pertinent.  Past Medical History:  Diagnosis Date  . Bronchitis   . CVA (cerebral infarction)    x 4  . Diabetes mellitus without complication ([place])   . GERD (gastroesophageal reflux disease)   . History of hiatal hernia   . Hyperlipidemia   . Hypertension     . Multiple allergies   . Murmur   . Osteoporosis   . Pneumonia   . Renal disorder   . Shingles   . Shingles   . Stroke [facility])    left sided sensory and motor deficits   Social History   Socioeconomic History  . Marital status: Divorced    Spouse name: Not on file  . Number of children: 2  . Years of education: 11th  . Highest education level: Not on file  Occupational History  . Occupation: Advertising copywriter  Tobacco Use  . Smoking status: Never Smoker  . Smokeless tobacco: Never Used  Substance and Sexual Activity  . Alcohol use: No  . Drug use: No  . Sexual activity: Never  Other Topics Concern  . Not on file  Social History Narrative   Patient lives at home alone, one stories, no pets   Patient right handed   Patient drinks coffee and  diet coke   Past profession- Special educational needs teacher Determinants of Health   Financial Resource Strain:   . Difficulty of Paying Living Expenses: Not on file  Food Insecurity:   . Worried About Charity fundraiser in the Last Year: Not on file  . Ran Out of Food in the Last Year: Not on file  Transportation Needs:   . Lack of Transportation (Medical): Not on file  . Lack of Transportation (Non-Medical): Not on file  Physical Activity:   . Days of Exercise per Week: Not on file  . Minutes of Exercise per Session: Not on file  Stress:   . Feeling of Stress : Not on file  Social Connections:   . Frequency of Communication with Friends and Family: Not on file  . Frequency of Social Gatherings with Friends and Family: Not on file  . Attends Religious Services: Not on file  . Active Member of Clubs or Organizations: Not on file  . Attends Archivist Meetings: Not on file  . Marital Status: Not on file   Family History  Problem Relation Age of Onset  . Stroke Mother   . Hypertension Father   . Kidney disease Father   . Diabetes Father   . Cancer Father   . Diabetes Sister   . Cancer Sister        lung  . Stroke Sister   .  Heart disease Brother   . Diabetes Brother   . Heart disease Brother   . Stroke Brother   . Diabetes Brother   . Stroke Brother    Scheduled Meds: . insulin aspart  0-9 Units Subcutaneous TID WC  . lactose free nutrition  237 mL Oral TID WC  . multivitamin with minerals  1 tablet Oral Daily   Continuous Infusions: . lactated ringers 1,000 mL (12/04/19 1317)   PRN Meds:.ibuprofen, ondansetron **OR** ondansetron (ZOFRAN) IV Medications Prior to Admission:  Prior to Admission medications   Medication Sig Start Date End Date Taking? Authorizing Provider  acetaminophen (TYLENOL) 500 MG tablet Take 500-1,000 mg by mouth every 6 (six) hours as needed for mild pain or headache.   Yes [provider]  aspirin EC 81 MG tablet Take 81 mg by mouth daily.    Yes [provider]  chlorpheniramine ([employer] ALLERGY RELIEF) 4 MG tablet Take 4 mg by mouth 2 (two) times daily as needed for allergies.   Yes [provider]  chlorthalidone (HYGROTON) 25 MG tablet Take 12.5 mg by mouth daily.  02/05/19  Yes [provider]  cholecalciferol (VITAMIN D3) 25 MCG (1000 UT) tablet Take 1 tablet (1,000 Units total) by mouth daily. 09/10/19  Yes Opalski, Deborah, DO  Continuous Blood Gluc Sensor (FREESTYLE LIBRE 14 DAY SENSOR) MISC 1 Cartridge by Does not apply route every 14 (fourteen) days. Dx: E11.649 Patient taking differently: Inject 1 patch into the skin every 14 (fourteen) days. Dx: E11.649  04/08/19  Yes Opalski, Deborah, DO  Ginkgo Biloba Extract (GINKGO BILOBA MEMORY ENHANCER) 60 MG CAPS Take 60 mg by mouth daily.    Yes [provider]  lisinopril (ZESTRIL) 2.5 MG tablet Take 1 tablet (2.5 mg total) by mouth daily. 09/10/19  Yes Opalski, Deborah, DO  Multiple Vitamins-Minerals (PRESERVISION AREDS) CAPS Take 1 capsule by mouth 2 (two) times daily.    Yes [provider]  pioglitazone (ACTOS) 15 MG tablet Take 1 tablet (15 mg total) by mouth daily. 09/10/19  Yes  Opalski, Neoma Laming,  DO  potassium chloride SA (K-DUR,KLOR-CON) 20 MEQ tablet Take 20 mEq by mouth at bedtime.    Yes [provider]  simvastatin (ZOCOR) 10 MG tablet Take 1 tablet (10 mg total) by mouth daily. Patient taking differently: Take 10 mg by mouth at bedtime.  09/10/19  Yes Opalski, Neoma Laming, DO  traMADol (ULTRAM) 50 MG tablet Take 50 mg by mouth in the morning and at bedtime.  11/23/19  Yes [provider]  Continuous Blood Gluc Receiver (FREESTYLE LIBRE 14 DAY READER) DEVI 1 Device by Does not apply route 3 (three) times daily. Dx: E11.649 02/05/18   Gildardo Cranker, DO   Allergies  Allergen Reactions  . Fish Allergy Nausea And Vomiting and Other (See Comments)    SEVERE NAUSEA AND VOMITING THAT LASTS FOR DAYS  . Codeine Hypertension  . Contrast Media [Iodinated Diagnostic Agents] Hypertension  . Milk-Related Compounds Diarrhea  . Shellfish-Derived Products Nausea And Vomiting and Other (See Comments)    SEVERE NAUSEA AND VOMITING THAT LASTS FOR DAYS   Review of Systems  Constitutional: Positive for activity change and appetite change.  Skin: Positive for color change.  All other systems reviewed and are negative.  Physical Exam Vitals and nursing note reviewed.  HENT:     Head: Normocephalic.     Nose: Nose normal.     Mouth/Throat:     Mouth: Mucous membranes are dry.  Eyes:     Pupils: Pupils are equal, round, and reactive to light.  Cardiovascular:     Rate and Rhythm: Normal rate and regular rhythm.  Pulmonary:     Effort: Pulmonary effort is normal.  Abdominal:     Palpations: Abdomen is soft.  Musculoskeletal:        General: Normal range of motion.     Cervical back: Normal range of motion.  Skin:    General: Skin is dry.     Capillary Refill: Capillary refill takes less than 2 seconds.     Coloration: Skin is jaundiced.  Neurological:     General: No focal deficit present.     Mental Status: She is alert and oriented to person, place, and  time.  Psychiatric:        Mood and Affect: Mood normal.    Vital Signs: BP 121/64   Pulse 66   Temp (!) 97.5 F (36.4 C) (Oral)   Resp 15   Ht 5' (1.524 m)   Wt 45.5 kg   SpO2 98%   BMI 19.59 kg/m  Pain Scale: 0-10   Pain Score: 0-No pain  SpO2: SpO2: 98 % O2 Device:SpO2: 98 % O2 Flow Rate: .O2 Flow Rate (L/min): 6 L/min  IO: Intake/output summary:   Intake/Output Summary (Last 24 hours) at 12/04/2019 1609 Last data filed at 12/04/2019 1431 Gross per 24 hour  Intake 400 ml  Output --  Net 400 ml   LBM: Last BM Date: 12/02/19 Baseline Weight: Weight: 44.6 kg Most recent weight: Weight: 45.5 kg     Palliative Assessment/Data: 40%   Time In: 1510 Time Out: 1620 Time Total: 70 Greater than 50%  of this time was spent counseling and coordinating care related to the above assessment and plan.  Signed by: Rosezella Rumpf, NP   Please contact Palliative Medicine Team phone at [phone] for questions and concerns.  For individual provider: See Shea Evans

## 2019-12-04 NOTE — Anesthesia Postprocedure Evaluation (Signed)
Anesthesia Post Note  Patient: Shirley Sanders  Procedure(s) Performed: ENDOSCOPIC RETROGRADE CHOLANGIOPANCREATOGRAPHY (ERCP) (N/A ) UPPER ENDOSCOPIC ULTRASOUND (EUS) LINEAR (N/A ) ESOPHAGOGASTRODUODENOSCOPY (EGD) WITH PROPOFOL (N/A ) [place] PLACEMENT     Patient location during evaluation: Endoscopy Anesthesia Type: General Level of consciousness: awake and alert Pain management: pain level controlled Vital Signs Assessment: post-procedure vital signs reviewed and stable Respiratory status: spontaneous breathing, nonlabored ventilation, respiratory function stable and patient connected to nasal cannula oxygen Cardiovascular status: blood pressure returned to baseline and stable Postop Assessment: no apparent nausea or vomiting Anesthetic complications: no    Last Vitals:  Vitals:   12/04/19 1521 12/04/19 1522  BP:    Pulse: 65 66  Resp: 14 15  Temp:    SpO2: 98% 98%    Last Pain:  Vitals:   12/04/19 1520  TempSrc:   PainSc: 0-No pain                 Catalina Gravel

## 2019-12-04 NOTE — Plan of Care (Signed)
  Problem: Education: Goal: Knowledge of General Education information will improve Description Including pain rating scale, medication(s)/side effects and non-pharmacologic comfort measures Outcome: Progressing   

## 2019-12-04 NOTE — Evaluation (Signed)
Physical Therapy Evaluation Patient Details Name: Shirley Sanders MRN: [id number] DOB: Mar 05, 1935 Today's Date: 12/04/2019   History of Present Illness  85 year old female with history of CVA, type 2diabetes mellitus, hypertension, hyperlipidemia who presented to the emergency department for the evaluation of back pain and jaundice. CT abd/pelvis w/o contrast was notable for multiple poorly defined hepatic lesions concerning for metastatic disease.    Clinical Impression  Pt admitted with above diagnosis. PTA pt lived at home alone, independent with mobility and ADLs. On eval, she required min assist bed mobility, min assist transfers, and min/HHA ambulation 25'. She presents with deficits in strength, balance and activity tolerance. Pt NPO on eval, awaiting procedure. Pt currently with functional limitations due to the deficits listed below (see PT Problem List). Pt will benefit from skilled PT to increase their independence and safety with mobility to allow discharge to the venue listed below.       Follow Up Recommendations SNF    Equipment Recommendations  Other (comment)(TBD)    Recommendations for Other Services       Precautions / Restrictions Precautions Precautions: Fall Restrictions Weight Bearing Restrictions: No      Mobility  Bed Mobility Overal bed mobility: Needs Assistance Bed Mobility: Supine to Sit;Sit to Supine     Supine to sit: Min assist;HOB elevated Sit to supine: Min guard;HOB elevated   General bed mobility comments: +rail, increased time  Transfers Overall transfer level: Needs assistance Equipment used: 1 person hand held assist Transfers: Sit to/from Stand Sit to Stand: Min assist;+2 safety/equipment         General transfer comment: assist to power up  Ambulation/Gait Ambulation/Gait assistance: Min assist;+2 safety/equipment Gait Distance (Feet): 25 Feet Assistive device: 1 person hand held assist Gait Pattern/deviations: Step-through  pattern;Decreased stride length Gait velocity: decreased   General Gait Details: Distance limited by fatigue. Pt NPO at time of eval, awaiting procedure (stent placement).  Stairs            Wheelchair Mobility    Modified Rankin (Stroke Patients Only)       Balance Overall balance assessment: Needs assistance Sitting-balance support: No upper extremity supported;Feet supported Sitting balance-Leahy Scale: Good     Standing balance support: Single extremity supported;During functional activity Standing balance-Leahy Scale: Fair                               Pertinent Vitals/Pain Pain Assessment: Faces Faces Pain Scale: Hurts little more Pain Location: back Pain Descriptors / Indicators: Discomfort;Grimacing;Guarding Pain Intervention(s): Limited activity within patient's tolerance;Repositioned    Home Living Family/patient expects to be discharged to:: Private residence Living Arrangements: Alone Available Help at Discharge: Family;Available PRN/intermittently Type of Home: House Home Access: Stairs to enter Entrance Stairs-Rails: Right Entrance Stairs-Number of Steps: 2 Home Layout: One level Home Equipment: None      Prior Function Level of Independence: Independent         Comments: pt does all her own cooking, laundry and home mgt. Does not drive due to macular degeneration.     Hand Dominance   Dominant Hand: Right    Extremity/Trunk Assessment   Upper Extremity Assessment Upper Extremity Assessment: Defer to OT evaluation    Lower Extremity Assessment Lower Extremity Assessment: Generalized weakness;LLE deficits/detail LLE Deficits / Details: residual weakness from previous CVA       Communication   Communication: No difficulties  Cognition Arousal/Alertness: Awake/alert Behavior During Therapy: [facility] for  tasks assessed/performed Overall Cognitive Status: Within Functional Limits for tasks assessed                                  General Comments: Daughter present during eval.      General Comments      Exercises     Assessment/Plan    PT Assessment Patient needs continued PT services  PT Problem List Decreased strength;Decreased mobility;Pain;Decreased balance;Decreased knowledge of use of DME;Decreased activity tolerance       PT Treatment Interventions DME instruction;Therapeutic activities;Gait training;Therapeutic exercise;Patient/family education;Stair training;Balance training;Functional mobility training    PT Goals (Current goals can be found in the Care Plan section)  Acute Rehab PT Goals Patient Stated Goal: home PT Goal Formulation: With patient/family Time For Goal Achievement: 12/18/19 Potential to Achieve Goals: Fair    Frequency Min 3X/week   Barriers to discharge Decreased caregiver support      Co-evaluation               AM-PAC PT "6 Clicks" Mobility  Outcome Measure Help needed turning from your back to your side while in a flat bed without using bedrails?: A Little Help needed moving from lying on your back to sitting on the side of a flat bed without using bedrails?: A Little Help needed moving to and from a bed to a chair (including a wheelchair)?: A Little Help needed standing up from a chair using your arms (e.g., wheelchair or bedside chair)?: A Little Help needed to walk in hospital room?: A Little Help needed climbing 3-5 steps with a railing? : A Lot 6 Click Score: 17    End of Session Equipment Utilized During Treatment: Gait belt Activity Tolerance: Patient limited by fatigue Patient left: in bed;with call bell/phone within reach;with family/visitor present Nurse Communication: Mobility status PT Visit Diagnosis: Muscle weakness (generalized) (M62.81);Unsteadiness on feet (R26.81);Pain    Time: 0569-7948 PT Time Calculation (min) (ACUTE ONLY): 19 min   Charges:   PT Evaluation $PT Eval Moderate Complexity: 1 Mod           Lorrin Goodell, PT  Office # [phone] Pager [phone]   Lorriane Shire 12/04/2019, 12:24 PM

## 2019-12-04 NOTE — Op Note (Signed)
[facility] Patient Name: Shirley Sanders Procedure Date : 12/04/2019 MRN: [id number] Attending MD: Carol Ada , MD Date of Birth: [birth year] CSN: [id number] Age: 85 Admit Type: Inpatient Procedure:                ERCP Indications:              Malignant stricture of the common bile duct Providers:                Carol Ada, MD, Carlyn Reichert, RN, Marguerita Merles, Technician Referring MD:              Medicines:                General Anesthesia Complications:            No immediate complications. Estimated Blood Loss:     Estimated blood loss: none. Procedure:                Pre-Anesthesia Assessment:                           - Prior to the procedure, a History and Physical                            was performed, and patient medications and                            allergies were reviewed. The patient's tolerance of                            previous anesthesia was also reviewed. The risks                            and benefits of the procedure and the sedation                            options and risks were discussed with the patient.                            All questions were answered, and informed consent                            was obtained. Prior Anticoagulants: The patient has                            taken no previous anticoagulant or antiplatelet                            agents. ASA Grade Assessment: III - A patient with                            severe systemic disease. After reviewing the risks  and benefits, the patient was deemed in                            satisfactory condition to undergo the procedure.                           - Sedation was administered by an anesthesia                            professional. General anesthesia was attained.                           After obtaining informed consent, the scope was                            passed under direct vision.  Throughout the                            procedure, the patient's blood pressure, pulse, and                            oxygen saturations were monitored continuously. The                            [id number]-[id number] ([id number] Olympus duodenoscope was                            introduced through the mouth, and used to inject                            contrast into and used to inject contrast into the                            bile duct and dorsal pancreatic duct. The ERCP was                            technically difficult and complex. The patient                            tolerated the procedure well. Scope In: Scope Out: Findings:      The major papilla was normal. A short 0.035 inch Soft Jagwire was passed       into the biliary tree. A 10 mm biliary sphincterotomy was made with a       traction (standard) sphincterotome using ERBE electrocautery. There was       no post-sphincterotomy bleeding. One 10 mm by 4 cm covered metal stent       was placed 3 cm into the common bile duct. Bile flowed through the       stent. The stent was in good position.      Cannulation was difficult as the ampulla was atrophied. After several       attempts the PD was cannulated and the wire was left in place. A two       wire technique was employed and the second wire easily cannulated the  CBD. It was secured in the left intrahepatic ducts. A 1 cm       sphincterotomy was created and contrast injection revealed the dilation       of the mid and proximal CBD. There was some filling of the cystic duct       and gallbladder. A 40 mm x 10 mm covered metallic stent was placed.       There was some distal migration of the stent during the deployment. The       proximal edge of the stent was above the cut off portion of the CBD. A       copious amount of dark bile rapidly drained with insertion of the stent. Impression:               - The major papilla appeared normal.                           - A biliary  sphincterotomy was performed.                           - One covered metal stent was placed into the                            common bile duct. Recommendation:           - Return patient to hospital ward for ongoing care. Procedure Code(s):        --- Professional ---                           [phone], Endoscopic retrograde                            cholangiopancreatography (ERCP); with placement of                            endoscopic stent into biliary or pancreatic duct,                            including pre- and post-dilation and guide wire                            passage, when performed, including sphincterotomy,                            when performed, each stent Diagnosis Code(s):        --- Professional ---                           K83.1, Obstruction of bile duct CPT copyright 2019 [employer]. All rights reserved. The codes documented in this report are preliminary and upon coder review may  be revised to meet current compliance requirements. Carol Ada, MD Carol Ada, MD 12/04/2019 2:53:20 PM This report has been signed electronically. Number of Addenda: 0

## 2019-12-04 NOTE — Evaluation (Signed)
Occupational Therapy Evaluation Patient Details Name: Shirley Sanders MRN: [id number] DOB: 1935-05-04 Today's Date: 12/04/2019    History of Present Illness 85 year old female with history of CVA, type 2diabetes mellitus, hypertension, hyperlipidemia who presented to the emergency department for the evaluation of back pain and jaundice. CT abd/pelvis w/o contrast was notable for multiple poorly defined hepatic lesions concerning for metastatic disease.   Clinical Impression   Pt with decline in function and safety with ADLs and ADL mobility with impaired strength, balance and entrance. Pt with weakness in L UE for previous CVA. Pt also has macular degeneration and does not drive; her daughter provides transportation. Pt lives at home alone and was independent with ADLs/selfcare, IADLs, home mgt, cooking and used no AD for mobility. Pt currently requires min A for bed mobility to sit EOB, LB ADLs mod A, min guard A at sink for grooming/hygiene and, min A +2 for sit - stand and  min A HHA with mobility. Pt would benefit from acute OT services to address impairments to maximize level of function and safety    Follow Up Recommendations  SNF    Equipment Recommendations  Other (comment)(TBD at next venue of care)    Recommendations for Other Services       Precautions / Restrictions Precautions Precautions: Fall Restrictions Weight Bearing Restrictions: No      Mobility Bed Mobility Overal bed mobility: Needs Assistance Bed Mobility: Supine to Sit;Sit to Supine     Supine to sit: Min assist;HOB elevated Sit to supine: Min guard;HOB elevated   General bed mobility comments: used rail, increased time  Transfers Overall transfer level: Needs assistance Equipment used: 1 person hand held assist Transfers: Sit to/from Stand Sit to Stand: Min assist;+2 safety/equipment         General transfer comment: assist to power up    Balance Overall balance assessment: Needs  assistance Sitting-balance support: No upper extremity supported;Feet supported Sitting balance-Leahy Scale: Good     Standing balance support: Single extremity supported;During functional activity Standing balance-Leahy Scale: Fair                             ADL either performed or assessed with clinical judgement   ADL Overall ADL's : Needs assistance/impaired Eating/Feeding: Independent;Sitting   Grooming: Wash/dry face;Wash/dry hands;Min guard;Standing   Upper Body Bathing: Set up;Supervision/ safety;Sitting   Lower Body Bathing: Moderate assistance   Upper Body Dressing : Set up;Supervision/safety;Sitting   Lower Body Dressing: Moderate assistance   Toilet Transfer: Minimal assistance;Ambulation   Toileting- Clothing Manipulation and Hygiene: Minimal assistance       Functional mobility during ADLs: Minimal assistance;+2 for physical assistance       Vision Baseline Vision/History: Wears glasses;Macular Degeneration Patient Visual Report: No change from baseline       Perception     Praxis      Pertinent Vitals/Pain Pain Assessment: Faces Faces Pain Scale: Hurts little more Pain Location: back Pain Descriptors / Indicators: Discomfort;Grimacing;Guarding Pain Intervention(s): Monitored during session;Repositioned     Hand Dominance Right   Extremity/Trunk Assessment Upper Extremity Assessment Upper Extremity Assessment: Generalized weakness;LUE deficits/detail LUE Deficits / Details: residual weakness from previous CVA, L shoulder flexion 0-75 degrees   Lower Extremity Assessment Lower Extremity Assessment: Generalized weakness;LLE deficits/detail LLE Deficits / Details: residual weakness from previous CVA       Communication Communication Communication: No difficulties   Cognition Arousal/Alertness: Awake/alert Behavior During Therapy: WFL for tasks  assessed/performed Overall Cognitive Status: Within Functional Limits for tasks  assessed                                 General Comments: Daughter present during eval.   General Comments       Exercises     Shoulder Instructions      Home Living Family/patient expects to be discharged to:: Private residence Living Arrangements: Alone Available Help at Discharge: Family;Available PRN/intermittently Type of Home: House Home Access: Stairs to enter [employer] of Steps: 2 Entrance Stairs-Rails: Right Home Layout: One level     Bathroom Shower/Tub: Teacher, early years/pre: Standard     Home Equipment: None          Prior Functioning/Environment Level of Independence: Independent        Comments: pt does all her own ADLs/selfcare, cooking, laundry and home mgt. Does not drive due to macular degeneration.        OT Problem List: Decreased strength;Impaired balance (sitting and/or standing);Decreased activity tolerance;Decreased range of motion;Impaired vision/perception;Decreased knowledge of use of DME or AE      OT Treatment/Interventions: Self-care/ADL training;DME and/or AE instruction;Therapeutic activities;Balance training;Therapeutic exercise;Patient/family education    OT Goals(Current goals can be found in the care plan section) Acute Rehab OT Goals Patient Stated Goal: home OT Goal Formulation: With patient/family Time For Goal Achievement: 12/18/19 Potential to Achieve Goals: Good ADL Goals Pt Will Perform Grooming: with supervision;with set-up;sitting Pt Will Perform Lower Body Bathing: with min assist;with min guard assist Pt Will Perform Lower Body Dressing: with min assist;with min guard assist Pt Will Transfer to Toilet: with min assist;with min guard assist;ambulating Pt Will Perform Toileting - Clothing Manipulation and hygiene: with min guard assist  OT Frequency: Min 2X/week   Barriers to D/C: Decreased caregiver support          Co-evaluation              AM-PAC OT "6  Clicks" Daily Activity     Outcome Measure Help from another person eating meals?: None Help from another person taking care of personal grooming?: A Little Help from another person toileting, which includes using toliet, bedpan, or urinal?: A Little Help from another person bathing (including washing, rinsing, drying)?: A Lot Help from another person to put on and taking off regular upper body clothing?: A Little Help from another person to put on and taking off regular lower body clothing?: A Lot 6 Click Score: 17   End of Session Equipment Utilized During Treatment: Gait belt  Activity Tolerance: Patient tolerated treatment well Patient left: in bed;with call bell/phone within reach;with family/visitor present  OT Visit Diagnosis: Muscle weakness (generalized) (M62.81);Unsteadiness on feet (R26.81)                Time: 5176-1607 OT Time Calculation (min): 17 min Charges:  OT General Charges $OT Visit: 1 Visit OT Evaluation $OT Eval Moderate Complexity: 1 Mod    Britt Bottom 12/04/2019, 12:47 PM

## 2019-12-04 NOTE — Anesthesia Procedure Notes (Signed)
Procedure Name: Intubation Date/Time: 12/04/2019 1:30 PM Performed by: Oletta Lamas, CRNA Pre-anesthesia Checklist: Patient identified, Emergency Drugs available, Suction available and Patient being monitored Patient Re-evaluated:Patient Re-evaluated prior to induction Oxygen Delivery Method: Circle System Utilized Preoxygenation: Pre-oxygenation with 100% oxygen Induction Type: IV induction Ventilation: Mask ventilation without difficulty Laryngoscope Size: Mac and 3 Grade View: Grade II Tube type: Oral Tube size: 7.0 mm Number of attempts: 1 Airway Equipment and Method: Stylet and Oral airway Placement Confirmation: ETT inserted through vocal cords under direct vision,  positive ETCO2 and breath sounds checked- equal and bilateral Secured at: 21 cm Tube secured with: Tape Dental Injury: Teeth and Oropharynx as per pre-operative assessment

## 2019-12-04 NOTE — Transfer of Care (Signed)
Immediate Anesthesia Transfer of Care Note  Patient: Shirley Sanders  Procedure(s) Performed: ENDOSCOPIC RETROGRADE CHOLANGIOPANCREATOGRAPHY (ERCP) (N/A ) UPPER ENDOSCOPIC ULTRASOUND (EUS) LINEAR (N/A ) ESOPHAGOGASTRODUODENOSCOPY (EGD) WITH PROPOFOL (N/A ) SPHINCTEROTOMY BILIARY STENT PLACEMENT  Patient Location: Endoscopy Unit  Anesthesia Type:General  Level of Consciousness: drowsy  Airway & Oxygen Therapy: Patient Spontanous Breathing and Patient connected to face mask oxygen  Post-op Assessment: Report given to RN and Post -op Vital signs reviewed and stable  Post vital signs: Reviewed and stable  Last Vitals:  Vitals Value Taken Time  BP 136/67 12/04/19 1445  Temp 36.4 C 12/04/19 1446  Pulse 71 12/04/19 1449  Resp 13 12/04/19 1449  SpO2 100 % 12/04/19 1449  Vitals shown include unvalidated device data.  Last Pain:  Vitals:   12/04/19 1446  TempSrc: Oral  PainSc: 0-No pain      Patients Stated Pain Goal: 4 (93/55/21 7471)  Complications: No apparent anesthesia complications

## 2019-12-05 ENCOUNTER — Inpatient Hospital Stay (HOSPITAL_COMMUNITY): Payer: Medicare Other

## 2019-12-05 DIAGNOSIS — R748 Abnormal levels of other serum enzymes: Secondary | ICD-10-CM

## 2019-12-05 DIAGNOSIS — R1013 Epigastric pain: Secondary | ICD-10-CM

## 2019-12-05 DIAGNOSIS — K831 Obstruction of bile duct: Secondary | ICD-10-CM

## 2019-12-05 DIAGNOSIS — D72829 Elevated white blood cell count, unspecified: Secondary | ICD-10-CM

## 2019-12-05 DIAGNOSIS — C799 Secondary malignant neoplasm of unspecified site: Secondary | ICD-10-CM

## 2019-12-05 LAB — CBC WITH DIFFERENTIAL/PLATELET
Abs Immature Granulocytes: 0 10*3/uL (ref 0.00–0.07)
Basophils Absolute: 0 10*3/uL (ref 0.0–0.1)
Basophils Relative: 0 %
Eosinophils Absolute: 0 10*3/uL (ref 0.0–0.5)
Eosinophils Relative: 0 %
HCT: 33.7 % — ABNORMAL LOW (ref 36.0–46.0)
Hemoglobin: 10.8 g/dL — ABNORMAL LOW (ref 12.0–15.0)
Lymphocytes Relative: 2 %
Lymphs Abs: 0.3 10*3/uL — ABNORMAL LOW (ref 0.7–4.0)
MCH: 26 pg (ref 26.0–34.0)
MCHC: 32 g/dL (ref 30.0–36.0)
MCV: 81.2 fL (ref 80.0–100.0)
Monocytes Absolute: 0.6 10*3/uL (ref 0.1–1.0)
Monocytes Relative: 4 %
Neutro Abs: 14.7 10*3/uL — ABNORMAL HIGH (ref 1.7–7.7)
Neutrophils Relative %: 94 %
Platelets: 317 10*3/uL (ref 150–400)
RBC: 4.15 MIL/uL (ref 3.87–5.11)
RDW: 21.5 % — ABNORMAL HIGH (ref 11.5–15.5)
WBC: 15.6 10*3/uL — ABNORMAL HIGH (ref 4.0–10.5)
nRBC: 0 % (ref 0.0–0.2)
nRBC: 0 /100 WBC

## 2019-12-05 LAB — COMPREHENSIVE METABOLIC PANEL
ALT: 128 U/L — ABNORMAL HIGH (ref 0–44)
AST: 215 U/L — ABNORMAL HIGH (ref 15–41)
Albumin: 2 g/dL — ABNORMAL LOW (ref 3.5–5.0)
Alkaline Phosphatase: 478 U/L — ABNORMAL HIGH (ref 38–126)
Anion gap: 14 (ref 5–15)
BUN: 29 mg/dL — ABNORMAL HIGH (ref 8–23)
CO2: 27 mmol/L (ref 22–32)
Calcium: 8.3 mg/dL — ABNORMAL LOW (ref 8.9–10.3)
Chloride: 97 mmol/L — ABNORMAL LOW (ref 98–111)
Creatinine, Ser: 1.04 mg/dL — ABNORMAL HIGH (ref 0.44–1.00)
GFR calc Af Amer: 58 mL/min — ABNORMAL LOW (ref >60)
GFR calc non Af Amer: 50 mL/min — ABNORMAL LOW (ref >60)
Glucose, Bld: 137 mg/dL — ABNORMAL HIGH (ref 70–99)
Potassium: 3.8 mmol/L (ref 3.5–5.1)
Sodium: 138 mmol/L (ref 135–145)
Total Bilirubin: 12.3 mg/dL — ABNORMAL HIGH (ref 0.3–1.2)
Total Protein: 5.4 g/dL — ABNORMAL LOW (ref 6.5–8.1)

## 2019-12-05 LAB — GLUCOSE, CAPILLARY
Glucose-Capillary: 111 mg/dL — ABNORMAL HIGH (ref 70–99)
Glucose-Capillary: 152 mg/dL — ABNORMAL HIGH (ref 70–99)
Glucose-Capillary: 145 mg/dL — ABNORMAL HIGH (ref 70–99)
Glucose-Capillary: 118 mg/dL — ABNORMAL HIGH (ref 70–99)

## 2019-12-05 LAB — LIPASE, BLOOD: Lipase: 1111 U/L — ABNORMAL HIGH (ref 11–51)

## 2019-12-05 MED ORDER — MORPHINE SULFATE (PF) 2 MG/ML IV SOLN
2.0000 mg | INTRAVENOUS | Status: DC | PRN
  Administered 2019-12-05: 2 mg via INTRAVENOUS
  Filled 2019-12-05: qty 1

## 2019-12-05 MED ORDER — ONDANSETRON HCL 4 MG/2ML IJ SOLN: 4.0000 mg | Freq: Four times a day (QID) | INTRAMUSCULAR | Status: DC | PRN

## 2019-12-05 MED ORDER — SODIUM CHLORIDE 0.9 % IV SOLN: INTRAVENOUS | Status: DC

## 2019-12-05 MED ORDER — FENTANYL CITRATE (PF) 100 MCG/2ML IJ SOLN
12.5000 ug | INTRAMUSCULAR | Status: DC | PRN
  Administered 2019-12-05: 12.5 ug via INTRAVENOUS
  Filled 2019-12-05: qty 2

## 2019-12-05 MED ORDER — BARIUM SULFATE 2.1 % PO SUSP
ORAL | Status: AC
  Administered 2019-12-05: 450 mL
  Filled 2019-12-05: qty 2

## 2019-12-05 MED ORDER — DICYCLOMINE HCL 10 MG/ML IM SOLN
10.0000 mg | Freq: Four times a day (QID) | INTRAMUSCULAR | Status: DC | PRN
  Administered 2019-12-05: 10 mg via INTRAMUSCULAR
  Filled 2019-12-05 (×2): qty 1

## 2019-12-05 MED ORDER — FENTANYL CITRATE (PF) 100 MCG/2ML IJ SOLN
25.0000 ug | INTRAMUSCULAR | Status: DC | PRN
  Administered 2019-12-05 – 2019-12-09 (×11): 25 ug via INTRAVENOUS
  Filled 2019-12-05 (×11): qty 2

## 2019-12-05 NOTE — Progress Notes (Signed)
Palliative Medicine Inpatient Follow Up Note   HPI: Per hospitalist note --> Patient is 85 year old female with history of CVA, type 2 diabetes mellitus, hypertension, hyperlipidemia who presents to the emergency department for the evaluation of back pain and jaundice. Reported 2 to 3 weeks of ongoing lower back pain with radiation across her left flank to the upper abdomen/epigastric region. Also reported poor oral intake and stated that her skin has appeared yellow. Had some nausea. She has chronic intermittent loose stools and was feeling weak. On presentation she was hemodynamically stable. Bilirubin was elevated at 13.3 with elevated liver enzymes, ALP of 372. Chest x-ray showed unchanged punctuate calcified granuloma of left upper lobe. CT abdomen/pelvis without contrast showed multiple poorly defined hepatic lesions concerning for metastatic disease to the liver, multiple small nodules throughout the visualized lung bases, mildly enlarged mesorectal lymph node, nonobstructive calculi in the right renal collecting system, moderate size hiatal hernia. CT lumbar spine showed widespread degenerative lumbar spinal stenosis. Ultrasound of the abdomen shows intra and extrahepatic biliary dilation, dilated CBD. Oncology and GI consulted.  2/26 - Palliative care was consulted to speak with patient regarding her wishes for herself given her metastatic disease. Shirley Sanders opted to speak with hospice   Today's Discussion (12/05/2019): Chart reviewed. Spoke to bedside RN. Shirley Sanders is in no distress at the time of assessment.  Plan to progress with hospice consultation for patient to transition back home.  Discussed with patient the importance of continued conversation with family and their  medical providers regarding overall plan of care and treatment options, ensuring decisions are within the context of the patients values and GOCs.  Questions and concerns addressed   Vital Signs Vitals:   12/05/19 0515 12/05/19 0846  BP: 108/62 (!) 107/56  Pulse: 78 (!) 57  Resp:  18  Temp:  (!) 97.5 F (36.4 C)  SpO2: 96% 96%    Intake/Output Summary (Last 24 hours) at 12/05/2019 0906 Last data filed at 12/04/2019 1431 Gross per 24 hour  Intake 300 ml  Output --  Net 300 ml   Last Weight  Most recent update: 12/02/2019 10:10 PM   Weight  45.5 kg (100 lb 5 oz)           Physical Exam Vitals and nursing note reviewed.  HENT:     Head: Normocephalic.     Nose: Nose normal.     Mouth/Throat:     Mouth: Mucous membranes are dry.  Eyes:     Pupils: Pupils are equal, round, and reactive to light.  Cardiovascular:     Rate and Rhythm: Normal rate and regular rhythm.  Pulmonary:     Effort: Pulmonary effort is normal.  Abdominal:     Palpations: Abdomen is soft.  Musculoskeletal:        General: Normal range of motion.     Cervical back: Normal range of motion.  Skin:    General: Skin is dry.     Capillary Refill: Capillary refill takes less than 2 seconds.     Coloration: Skin is jaundiced.  Neurological:     General: No focal deficit present.     Mental Status: She is alert and oriented to person, place, and time.  Psychiatric:        Mood and Affect: Mood normal.  SUMMARY OF RECOMMENDATIONS   DNAR/DNI, no feeding tubes, no HD  Hospice consult, for transition back home  Chaplain Consult  Time Spent: 15 Greater than 50% of the time was  spent in counseling and coordination of care ______________________________________________________________________________________ [facility] Team Team Cell Phone: [phone] Please utilize secure chat with additional questions, if there is no response within 30 minutes please call the above phone number  Palliative Medicine Team providers are available by phone from 7am to 7pm daily and can be reached through the team cell phone.  Should this patient require assistance outside of these  hours, please call the patient's attending physician.

## 2019-12-05 NOTE — Progress Notes (Signed)
[facility] ROUNDING NOTE   Subjective: EUS and ERCP completed yesterday by Dr. Benson Norway, n/f dilated CBD (12 mm) with a 7.4 mm mass identified in the distal third of the duct.  No clear mass in HOP.  FNA not performed.  Mass did cause abrupt narrowing of the CBD consistent with cutoff sign.  40 mm x 10 mm covered metal stent deployed in the distal CBD with good bile flow.  Difficult cannulation and PD was cannulated with wire left in place (2 wire technique) but no contrast injected into PD.  Developed abdominal pain increasing this morning.  Decreased appetite.  Patient daughter at bedside.  Pain was present prior to ERCP, but increased now.  Otherwise afebrile and hemodynamically stable.   Objective: Vital signs in last 24 hours: Temp:  [97.5 F (36.4 C)-98.1 F (36.7 C)] 97.7 F (36.5 C) (02/27 1630) Pulse Rate:  [57-78] 71 (02/27 1630) Resp:  [16-18] 18 (02/27 1630) BP: (107-129)/(56-69) 129/67 (02/27 1630) SpO2:  [95 %-98 %] 95 % (02/27 1630) Last BM Date: 12/05/19 General: NAD Lungs:  CTA b/l, no w/r/r Heart:  RRR, no m/r/g Abdomen: TTP in upper abdomen, soft,, ND.  Hypoactive bowel sounds. Ext:  No c/c/e    Intake/Output from previous day: 02/26 0701 - 02/27 0700 In: 300 [I.V.:300] Out: -  Intake/Output this shift: Total I/O In: 360 [P.O.:360] Out: -    Lab Results: Recent Labs    12/05/19 1322  WBC 15.6*  HGB 10.8*  PLT 317  MCV 81.2   BMET Recent Labs    12/03/19 0434 12/04/19 0548 12/05/19 1322  NA 134* 137 138  K 3.7 3.6 3.8  CL 97* 99 97*  CO2 24 26 27   GLUCOSE 118* 102* 137*  BUN 18 18 29*  CREATININE 0.97 0.85 1.04*  CALCIUM 8.4* 8.3* 8.3*   LFT Recent Labs    12/03/19 0434 12/04/19 0548 12/05/19 1322  PROT 5.3* 5.1* 5.4*  ALBUMIN 2.1* 1.9* 2.0*  AST 93* 96* 215*  ALT 64* 69* 128*  ALKPHOS 317* 303* 478*  BILITOT 11.3* 11.3* 12.3*   PT/INR Recent Labs    12/03/19 0434  INR 1.6*      Imaging/Other results: DG  ERCP BILIARY & PANCREATIC DUCTS  Result Date: 12/04/2019 : High-grade stricture and obstruction of the common bile duct CLINICAL DATA:  Metastatic adenocarcinoma to the liver and biliary obstruction. EXAM: ERCP TECHNIQUE: Multiple spot images obtained with the fluoroscopic device and submitted for interpretation post-procedure. COMPARISON:  CT of the abdomen and pelvis on 11/29/2019 and abdominal ultrasound on 11/30/2019. FINDINGS: Imaging during ERCP demonstrates cannulation of the common bile duct with contrast injection demonstrating high-grade stricture and obstruction of the distal common bile duct with dilatation of the proximal common bile duct and intrahepatic bile ducts. After crossing the level of obstruction, a self expanding metallic biliary stent was placed. The stent is constricted at the level of biliary obstruction. IMPRESSION: High-grade stricture and obstruction of the distal common bile duct after crossing the level of obstruction which was crossed during the procedure and treated with placement of a self expanding metallic biliary stent. These images were submitted for radiologic interpretation only. Please see the procedural report for the amount of contrast and the fluoroscopy time utilized. Electronically Signed   By: Aletta Edouard M.D.   On: 12/04/2019 14:53      Assessment and Plan:  1) Metastatic adenocarcinoma 2) Malignant Bile duct obstruction 3) Abdominal pain 4) Leukocytosis 5) Hyperbilirubinemia  85 year old  female with metastatic adenocarcinoma of undetermined primary, s/p EUS and ERCP on 12/04/2019 with 4 cm x 10 mm covered metal stent placed in the bile duct.  Abdominal pain today highly suspicious for post ERCP pancreatitis.  -N.p.o. -CBC with new leukocytosis (18.6).  Very slight uptrend in Hgb to 10.8 (9.4 yesterday) but not frank hemoconcentration -Follow-up CMP and lipase (drawn and pending) -CT ordered -If CT and lipase confirmatory of pancreatitis, plan  for continued n.p.o., start LR at 250 cc/hour (no history of heart failure noted on chart review) with pain control and antiemetics as needed -GI service will continue to follow    Lavena Bullion, DO  12/05/2019, 4:42 PM [facility] Pager [phone]

## 2019-12-05 NOTE — Plan of Care (Signed)
  Problem: Education: Goal: Knowledge of General Education information will improve Description Including pain rating scale, medication(s)/side effects and non-pharmacologic comfort measures Outcome: Progressing   

## 2019-12-05 NOTE — Progress Notes (Signed)
PROGRESS NOTE    Shirley Sanders  HUD:[id number] DOB: 1935-09-10 DOA: 11/29/2019 PCP: Mellody Dance, DO   Brief Narrative:  Patient is 85 year old female with history of CVA, type 2diabetes mellitus, hypertension, hyperlipidemia who presents to the emergency department for the evaluation of back pain and jaundice.  Reported 2 to 3 weeks of ongoing lower back pain with radiation across her left flank to the upper abdomen/epigastric region.  Also reported poor oral intake and stated that her skin has appeared yellow.  Had some nausea.  She has chronic intermittent loose stools and was feeling weak.  On presentation she was hemodynamically stable.  Bilirubin was elevated at 13.3 with elevated liver enzymes, ALP of 372.  Chest x-ray showed unchanged punctuate calcified granuloma of left upper lobe.  CT abdomen/pelvis without contrast showed multiple poorly defined hepatic lesions concerning for metastatic disease to the liver, multiple small nodules throughout the visualized lung bases, mildly enlarged mesorectal lymph node, nonobstructive calculi in the right renal collecting system, moderate size hiatal hernia.  CT lumbar spine showed widespread degenerative lumbar spinal stenosis.  Ultrasound of the abdomen shows intra and extrahepatic biliary dilation, dilated CBD.  Oncology and GI consulted. Underwent liver biopsy on 12/03/2019 by IR.  Underwent  ERCP with  Stent placement on on 12/04/19  by GI. PT/OT evaluated her and recommended skilled nursing facility.  Palliative  care also consulted and recommended hospice at home.  Assessment & Plan:   Active Problems:   Hypertension associated with diabetes ([place])   Diabetes mellitus, type 2 ([place])   History of CVA with residual deficit   Hyperlipidemia associated with type 2 diabetes mellitus ([place])   Metastatic disease ([place])   Hyperbilirubinemia   Jaundice   Protein-calorie malnutrition, severe   Palliative care by specialist   Goals of care,  counseling/discussion   DNR (do not resuscitate)   Hyperbilirubinemia/metastatic liver lesions:  Suspected metastatic disease of unknown primary.  CT imaging as above.  Oncology following.  Ultrasound of the abdomen as above. Underwent liver biopsy on 12/03/19.  CT chest with contrast for staging showed numerous subcentimeter bilateral pulmonary nodules. Underwent liver biopsy on 12/03/2019 by IR.  Underwent  ERCP with Stent placement in CBD on 12/04/19  by GI. We had a long conversation with daughter .  Given patient's advanced age, dementia, daughter is not very interested to pursue further aggressive treatment for her liver cancer.  Palliative care consulted.  Daughter agreeable with home with hospice  Abdominal pain: Complains of diffuse abdominal pain today.  Abdomen is diffusely tender.  Has bowel sounds and abdomen is soft.  Will check for CT abdomen/pelvis.  Type 2 diabetes mellitus: On Actos at home.  Continue sliding scale insulin here.  Monitor CBGs  History of CVA: Continue statin.  Aspirin on hold for potential biopsy.  She has mild left residual hemiparesis.  She is ambulatory on baseline without  a walker or cane.  She lives by herself.PT/OT recommended skilled nursing facility but the plan is to send her to home with hospice  Hypertension: Currently normotensive.  Home chlorthalidone and lisinopril on hold  Hyperlipidemia: On statin .  We will hold because of elevated liver enzymes, hyperbilirubinemia.We will resume on DC.  Right thyroid nodule: CT scan showed indeterminate right thyroid nodule of 1.8 cm .  Normal TSH.  Ultrasound of the thyroid showed hypoechoic 2.9 cm solid right  nodule with microcalcification in the right lower lobe.  Discussed with the daughter, and she does not want  to pursue further investigation.  Debility/deconditioning: PT/OT consulted and recommended skilled nursing facility.  She is confused on and off.  Continue supportive care. She lives alone.   Daughter will be with patient at her home.  Home with hospice will be arranged    Nutrition Problem: Severe Malnutrition Etiology: acute illness(newly diagnosed metastatic disease)      DVT prophylaxis:SCD Code Status: DNR Family Communication: Discussed with daughter at bedside on 12/05/2019  disposition Plan: Patient is from home.  She is not stable for discharge today because she is complaining of abdominal pain.  We have ordered CT abdomen/pelvis.  She also needs to be seen by case manager, hospice staff  and hospice equipment  should be arranged before discharge  Consultants: GI, oncology  Procedures: None  Antimicrobials:  Anti-infectives (From admission, onward)   None      Subjective: Patient seen and examined the bedside this morning.  Complains of severe abdominal pain today.  Ordering CT abdomen/pelvis.  No nausea vomiting or diarrhea.  Objective: Vitals:   12/04/19 1522 12/04/19 1644 12/04/19 2103 12/05/19 0515  BP:  117/69 107/63 108/62  Pulse: 66 70 68 78  Resp: 15 16 16    Temp:  97.7 F (36.5 C) 98.1 F (36.7 C)   TempSrc:  Oral Oral   SpO2: 98% 97% 98% 96%  Weight:      Height:        Intake/Output Summary (Last 24 hours) at 12/05/2019 0831 Last data filed at 12/04/2019 1431 Gross per 24 hour  Intake 300 ml  Output --  Net 300 ml   Filed Weights   11/29/19 2222 12/01/19 2205 12/02/19 2025  Weight: 44.5 kg 45 kg 45.5 kg    Examination:   General exam: Not in distress, deconditioned, debilitated  Respiratory system: Bilateral equal air entry, normal vesicular breath sounds, no wheezes or crackles  Cardiovascular system: S1 & S2 heard, RRR. No JVD, murmurs, rubs, gallops or clicks. Gastrointestinal system: Abdomen is nondistended, soft , has generalized tenderness. no organomegaly or masses felt. Normal bowel sounds heard. Central nervous system: Alert and awake but not oriented extremities: No edema, no clubbing ,no cyanosis, distal peripheral  pulses palpable. Skin: Icterus    Data Reviewed: I have personally reviewed following labs and imaging studies  CBC: Recent Labs  Lab 11/29/19 1530 11/30/19 0405 12/01/19 0459  WBC 11.7* 8.9 8.1  NEUTROABS  --   --  6.2  HGB 11.1* 8.6* 9.4*  HCT 34.4* 27.5* 29.7*  MCV 80.8 81.1 81.8  PLT 412* 284 161   Basic Metabolic Panel: Recent Labs  Lab 11/30/19 0405 12/01/19 0459 12/02/19 0439 12/03/19 0434 12/04/19 0548  NA 131* 136 132* 134* 137  K 4.3 4.0 4.2 3.7 3.6  CL 94* 98 95* 97* 99  CO2 25 27 22 24 26   GLUCOSE 98 80 165* 118* 102*  BUN 18 13 15 18 18   CREATININE 0.92 0.95 1.02* 0.97 0.85  CALCIUM 8.4* 8.6* 8.3* 8.4* 8.3*   GFR: Estimated Creatinine Clearance: 36.7 mL/min (by C-G formula based on SCr of 0.85 mg/dL). Liver Function Tests: Recent Labs  Lab 11/30/19 0405 12/01/19 0459 12/02/19 0439 12/03/19 0434 12/04/19 0548  AST 80* 107* 91* 93* 96*  ALT 44 56* 60* 64* 69*  ALKPHOS 275* 290* 296* 317* 303*  BILITOT 10.1* 11.4* 12.1* 11.3* 11.3*  PROT 5.2* 5.4* 5.6* 5.3* 5.1*  ALBUMIN 1.9* 1.9* 2.0* 2.1* 1.9*   Recent Labs  Lab 11/29/19 1530  LIPASE 30  No results for input(s): AMMONIA in the last 168 hours. Coagulation Profile: Recent Labs  Lab 12/01/19 1016 12/02/19 0727 12/03/19 0434  INR 1.6* 1.7* 1.6*   Cardiac Enzymes: No results for input(s): CKTOTAL, CKMB, CKMBINDEX, TROPONINI in the last 168 hours. BNP (last 3 results) No results for input(s): PROBNP in the last 8760 hours. HbA1C: No results for input(s): HGBA1C in the last 72 hours. CBG: Recent Labs  Lab 12/04/19 1124 12/04/19 1312 12/04/19 1642 12/04/19 2145 12/05/19 0734  GLUCAP 118* 108* 181* 145* 111*   Lipid Profile: No results for input(s): CHOL, HDL, LDLCALC, TRIG, CHOLHDL, LDLDIRECT in the last 72 hours. Thyroid Function Tests: Recent Labs    12/02/19 1216  TSH 0.719   Anemia Panel: No results for input(s): VITAMINB12, FOLATE, FERRITIN, TIBC, IRON, RETICCTPCT  in the last 72 hours. Sepsis Labs: No results for input(s): PROCALCITON, LATICACIDVEN in the last 168 hours.  Recent Results (from the past 240 hour(s))  SARS CORONAVIRUS 2 (TAT 6-24 HRS) Nasopharyngeal Nasopharyngeal Swab     Status: None   Collection Time: 11/29/19  7:52 PM   Specimen: Nasopharyngeal Swab  Result Value Ref Range Status   SARS Coronavirus 2 NEGATIVE NEGATIVE Final    Comment: (NOTE) SARS-CoV-2 target nucleic acids are NOT DETECTED. The SARS-CoV-2 RNA is generally detectable in upper and lower respiratory specimens during the acute phase of infection. Negative results do not preclude SARS-CoV-2 infection, do not rule out co-infections with other pathogens, and should not be used as the sole basis for treatment or other patient management decisions. Negative results must be combined with clinical observations, patient history, and epidemiological information. The expected result is Negative. Fact Sheet for Patients: SugarRoll.be Fact Sheet for Healthcare Providers: https://www.woods-mathews.com/ This test is not yet approved or cleared by the [place] FDA and  has been authorized for detection and/or diagnosis of SARS-CoV-2 by FDA under an Emergency Use Authorization (EUA). This EUA will remain  in effect (meaning this test can be used) for the duration of the COVID-19 declaration under Section 56 4(b)(1) of the Act, 21 U.S.C. section 360bbb-3(b)(1), unless the authorization is terminated or revoked sooner. Performed at [facility], [place] [address] [place],  [address]          Radiology Studies: US BIOPSY (LIVER)  Result Date: 12/03/2019 INDICATION: 85 year old female with a history liver masses and elevated liver enzymes EXAM: ULTRASOUND-GUIDED BIOPSY OF LIVER MASS MEDICATIONS: None. ANESTHESIA/SEDATION: Moderate (conscious) sedation was employed during this procedure. A total of Versed 0.5 mg and  Fentanyl 25 mcg was administered intravenously. Moderate Sedation Time: 10 minutes. The patient's level of consciousness and vital signs were monitored continuously by radiology nursing throughout the procedure under my direct supervision. FLUOROSCOPY TIME:  NONE COMPLICATIONS: NONE PROCEDURE: Informed written consent was obtained from the patient after a thorough discussion of the procedural risks, benefits and alternatives. All questions were addressed. Maximal Sterile Barrier Technique was utilized including caps, mask, sterile gowns, sterile gloves, sterile drape, hand hygiene and skin antiseptic. A timeout was performed prior to the initiation of the procedure. Patient positioned supine position on the ultrasound stretcher. Images were stored sent to PACs. Patient is prepped and draped in the usual sterile fashion. 1% lidocaine was used for local anesthesia. Using ultrasound guidance guide needle was advanced into the right-sided liver mass. Once we confirmed needle tip position multiple 18 gauge core biopsy were achieved. Gel-Foam pledgets were infused. Needle was removed and final image was stored. Tissue specimen placed  into formalin.  Sterile bandage was placed. Patient tolerated the procedure well and remained hemodynamically stable throughout. No complications were encountered and no significant blood loss. IMPRESSION: Status post ultrasound-guided biopsy of right liver mass. Tissue specimen sent to pathology for complete histopathologic analysis. Signed, Dulcy Fanny. Dellia Nims, RPVI [facility] Radiology Electronically Signed   By: Corrie Mckusick D.O.   On: 12/03/2019 12:59   DG ERCP BILIARY & PANCREATIC DUCTS  Result Date: 12/04/2019 : High-grade stricture and obstruction of the common bile duct CLINICAL DATA:  Metastatic adenocarcinoma to the liver and biliary obstruction. EXAM: ERCP TECHNIQUE: Multiple spot images obtained with the fluoroscopic device  and submitted for interpretation post-procedure. COMPARISON:  CT of the abdomen and pelvis on 11/29/2019 and abdominal ultrasound on 11/30/2019. FINDINGS: Imaging during ERCP demonstrates cannulation of the common bile duct with contrast injection demonstrating high-grade stricture and obstruction of the distal common bile duct with dilatation of the proximal common bile duct and intrahepatic bile ducts. After crossing the level of obstruction, a self expanding metallic biliary stent was placed. The stent is constricted at the level of biliary obstruction. IMPRESSION: High-grade stricture and obstruction of the distal common bile duct after crossing the level of obstruction which was crossed during the procedure and treated with placement of a self expanding metallic biliary stent. These images were submitted for radiologic interpretation only. Please see the procedural report for the amount of contrast and the fluoroscopy time utilized. Electronically Signed   By: Aletta Edouard M.D.   On: 12/04/2019 14:53   US THYROID  Result Date: 12/03/2019 CLINICAL DATA:  Goiter. EXAM: THYROID ULTRASOUND TECHNIQUE: Ultrasound examination of the thyroid gland and adjacent soft tissues was performed. COMPARISON:  None FINDINGS: Parenchymal Echotexture: Mildly heterogenous Isthmus: Normal in size measures 0.2 cm in diameter Right lobe: Normal in size measuring 4.4 x 1.8 x 1.6 cm Left lobe: Slightly atrophic in size measuring 3.0 x 1.3 x 0.8 cm _________________________________________________________ Estimated total number of nodules >/= 1 cm: 1 Number of spongiform nodules >/=  2 cm not described below (TR1): 0 Number of mixed cystic and solid nodules >/= 1.5 cm not described below ([place]): 0 _________________________________________________________ Nodule # 1: Location: Right; Inferior Maximum size: 2.9 cm; Other 2 dimensions: 2.3 x 1.4 cm Composition: solid/almost completely solid (2) Echogenicity: hypoechoic (2) Shape: not  taller-than-wide (0) Margins: lobulated/irregular (2) Echogenic foci: macrocalcifications (1) ACR TI-RADS total points: 7. ACR TI-RADS risk category: TR5 (>/= 7 points). ACR TI-RADS recommendations: **Given size (>/= 1.0 cm) and appearance, fine needle aspiration of this highly suspicious nodule should be considered based on TI-RADS criteria. _________________________________________________________ There is an approximately 0.6 cm hypoechoic ill-defined nodule/pseudonodule within mid, medial aspect the left lobe of the thyroid (labeled 2), which does not meet criteria to recommend percutaneous sampling or continued dedicated follow-up. IMPRESSION: Nodule #1 within the mid/inferior aspect of the right lobe of the thyroid meets imaging criteria to recommend percutaneous sampling as clinically indicated. The above is in keeping with the ACR TI-RADS recommendations - J Am Coll Radiol 2017;14:587-595. Electronically Signed   By: Sandi Mariscal M.D.   On: 12/03/2019 13:46        Scheduled Meds: . insulin aspart  0-9 Units Subcutaneous TID WC  . lactose free nutrition  237 mL Oral TID WC  . multivitamin with minerals  1 tablet Oral Daily   Continuous Infusions: . lactated ringers 1,000 mL (12/04/19 1317)     LOS: 5 days  Time spent: 35 mins.More than 50% of that time was spent in counseling and/or coordination of care.      Shelly Coss, MD [facility] P2/27/2021, 8:31 AM

## 2019-12-06 DIAGNOSIS — K859 Acute pancreatitis without necrosis or infection, unspecified: Secondary | ICD-10-CM

## 2019-12-06 DIAGNOSIS — K858 Other acute pancreatitis without necrosis or infection: Secondary | ICD-10-CM

## 2019-12-06 LAB — CBC WITH DIFFERENTIAL/PLATELET
Abs Immature Granulocytes: 0 10*3/uL (ref 0.00–0.07)
Basophils Absolute: 0 10*3/uL (ref 0.0–0.1)
Basophils Relative: 0 %
Eosinophils Absolute: 0 10*3/uL (ref 0.0–0.5)
Eosinophils Relative: 0 %
HCT: 33.5 % — ABNORMAL LOW (ref 36.0–46.0)
Hemoglobin: 10.9 g/dL — ABNORMAL LOW (ref 12.0–15.0)
Lymphocytes Relative: 1 %
Lymphs Abs: 0.2 10*3/uL — ABNORMAL LOW (ref 0.7–4.0)
MCH: 25.9 pg — ABNORMAL LOW (ref 26.0–34.0)
MCHC: 32.5 g/dL (ref 30.0–36.0)
MCV: 79.6 fL — ABNORMAL LOW (ref 80.0–100.0)
Monocytes Absolute: 0.2 10*3/uL (ref 0.1–1.0)
Monocytes Relative: 1 %
Neutro Abs: 16.9 10*3/uL — ABNORMAL HIGH (ref 1.7–7.7)
Neutrophils Relative %: 98 %
Platelets: 319 10*3/uL (ref 150–400)
RBC: 4.21 MIL/uL (ref 3.87–5.11)
RDW: 22.5 % — ABNORMAL HIGH (ref 11.5–15.5)
WBC: 17.2 10*3/uL — ABNORMAL HIGH (ref 4.0–10.5)
nRBC: 0 % (ref 0.0–0.2)
nRBC: 0 /100 WBC

## 2019-12-06 LAB — COMPREHENSIVE METABOLIC PANEL
ALT: 124 U/L — ABNORMAL HIGH (ref 0–44)
AST: 172 U/L — ABNORMAL HIGH (ref 15–41)
Albumin: 1.9 g/dL — ABNORMAL LOW (ref 3.5–5.0)
Alkaline Phosphatase: 436 U/L — ABNORMAL HIGH (ref 38–126)
Anion gap: 11 (ref 5–15)
BUN: 27 mg/dL — ABNORMAL HIGH (ref 8–23)
CO2: 25 mmol/L (ref 22–32)
Calcium: 7.7 mg/dL — ABNORMAL LOW (ref 8.9–10.3)
Chloride: 101 mmol/L (ref 98–111)
Creatinine, Ser: 0.89 mg/dL (ref 0.44–1.00)
GFR calc Af Amer: >60 mL/min (ref >60)
GFR calc non Af Amer: >60 mL/min (ref >60)
Glucose, Bld: 117 mg/dL — ABNORMAL HIGH (ref 70–99)
Potassium: 4.9 mmol/L (ref 3.5–5.1)
Sodium: 137 mmol/L (ref 135–145)
Total Bilirubin: 10.2 mg/dL — ABNORMAL HIGH (ref 0.3–1.2)
Total Protein: 5.1 g/dL — ABNORMAL LOW (ref 6.5–8.1)

## 2019-12-06 LAB — GLUCOSE, CAPILLARY
Glucose-Capillary: 94 mg/dL (ref 70–99)
Glucose-Capillary: 109 mg/dL — ABNORMAL HIGH (ref 70–99)
Glucose-Capillary: 116 mg/dL — ABNORMAL HIGH (ref 70–99)

## 2019-12-06 MED ORDER — LACTATED RINGERS IV SOLN: INTRAVENOUS | Status: DC

## 2019-12-06 NOTE — Progress Notes (Addendum)
Palliative Medicine Inpatient Follow Up Note   HPI: Per hospitalist note --> Patient is 85 year old female with history of CVA, type 2 diabetes mellitus, hypertension, hyperlipidemia who presents to the emergency department for the evaluation of back pain and jaundice. Reported 2 to 3 weeks of ongoing lower back pain with radiation across her left flank to the upper abdomen/epigastric region. Also reported poor oral intake and stated that her skin has appeared yellow. Had some nausea. She has chronic intermittent loose stools and was feeling weak. On presentation she was hemodynamically stable. Bilirubin was elevated at 13.3 with elevated liver enzymes, ALP of 372. Chest x-ray showed unchanged punctuate calcified granuloma of left upper lobe. CT abdomen/pelvis without contrast showed multiple poorly defined hepatic lesions concerning for metastatic disease to the liver, multiple small nodules throughout the visualized lung bases, mildly enlarged mesorectal lymph node, nonobstructive calculi in the right renal collecting system, moderate size hiatal hernia. CT lumbar spine showed widespread degenerative lumbar spinal stenosis. Ultrasound of the abdomen shows intra and extrahepatic biliary dilation, dilated CBD. Oncology and GI consulted.  2/26 - Palliative care was consulted to speak with patient regarding her wishes for herself given her metastatic disease. [place] opted to speak with hospice.  2/27 - Patient is in no distress. Plan remains to proceed with hospice.   Today's Discussion (12/06/2019): Chart reviewed. Spoke to bedside RN. Ayda states that she is having some abdominal pain at the time of assessment. Her bedside RN is present at this time and plans to provide pain medicine.   Plan to progress with hospice consultation for patient to transition back home.  Discussed with patient the importance of continued conversation with family and their  medical providers regarding overall  plan of care and treatment options, ensuring decisions are within the context of the patients values and GOCs.  Questions and concerns addressed  Vital Signs Vitals:   12/06/19 0546 12/06/19 0937  BP: 119/75 126/68  Pulse: 91 91  Resp: 18 18  Temp: 98.4 F (36.9 C) 98.3 F (36.8 C)  SpO2: 97% 97%    Intake/Output Summary (Last 24 hours) at 12/06/2019 1209 Last data filed at 12/06/2019 0900 Gross per 24 hour  Intake 1171.76 ml  Output 100 ml  Net 1071.76 ml   Last Weight  Most recent update: 12/02/2019 10:10 PM   Weight  45.5 kg (100 lb 5 oz)           Physical Exam Vitals and nursing note reviewed.  HENT:     Head: Normocephalic.     Nose: Nose normal.     Mouth/Throat:     Mouth: Mucous membranes are dry.  Eyes:     Pupils: Pupils are equal, round, and reactive to light.  Cardiovascular:     Rate and Rhythm: Normal rate and regular rhythm.  Pulmonary:     Effort: Pulmonary effort is normal.  Abdominal:     Palpations: Abdomen is soft.  Musculoskeletal:        General: Normal range of motion.     Cervical back: Normal range of motion.  Skin:    General: Skin is dry.     Capillary Refill: Capillary refill takes less than 2 seconds.     Coloration: Skin is jaundiced.  Neurological:     General: No focal deficit present.     Mental Status: She is alert and oriented to person, place, and time.  Psychiatric:        Mood and Affect: Mood  normal.  SUMMARY OF RECOMMENDATIONS   DNAR/DNI, no feeding tubes, no HD  Hospice consult, for transition back home  Patients daughter, Maudie Mercury requested that we help fill in [facility] paperwork. Palliative service will do this.  Time Spent: 15 Greater than 50% of the time was spent in counseling and coordination of care ______________________________________________________________________________________ [place] Team Team Cell Phone: [phone] Please utilize secure chat with additional  questions, if there is no response within 30 minutes please call the above phone number  Palliative Medicine Team providers are available by phone from 7am to 7pm daily and can be reached through the team cell phone.  Should this patient require assistance outside of these hours, please call the patient's attending physician.

## 2019-12-06 NOTE — TOC Transition Note (Signed)
Transition of Care [facility]) - CM/SW Discharge Note   Patient Details  Name: MODENA BELLEMARE MRN: [id number] Date of Birth: 04/24/1935  [facility]) CM/SW Contact:  Carles Collet, RN Phone Number: 12/06/2019, 1:45 PM   Clinical Narrative:    13:00 Spoke w patient's daughter Wynonia Hazard. Discuss DC plan to home w hospice. Patient will dc to [address] [place] [address] Maudie Mercury will be staying with patient at this address to provide 24 hour care. Kim's number [phone]  Patient will need hospital bed delivered to the home prior to DC. Verified with Maudie Mercury that another family member will be able to accept delivery. Will also need PTAR transport.  Discussed home hospice providers. Maudie Mercury is agreeable to any provider, preference for one that can get bed to the house today. [facility] confirmed that they should be able to get a bed there by 5pm. Referral accepted, Kim notified.  Will set up PTAR once delivery is confirmed.      Final next level of care: Home w Hospice Care     Patient Goals and CMS Choice   CMS Medicare.gov Compare Post Acute Care list provided to:: Other (Comment Required) Choice offered to / list presented to : Adult Children  Discharge Placement                       Discharge Plan and Services                            HH Agency: ([place]) Date [place]: 12/06/19 Time [place]: 1344 Representative spoke with at [place]: [place] ([place]) Interventions     Readmission Risk Interventions No flowsheet data found.

## 2019-12-06 NOTE — Progress Notes (Signed)
[facility] ROUNDING NOTE   Subjective:  85 year old female with EUS and ERCP completed 12/04/2019 by Dr. Benson Norway, n/f dilated CBD (12 mm) with a 7.4 mm mass identified in the distal third of the duct.  No clear mass in HOP.  FNA not performed.  Mass did cause abrupt narrowing of the CBD consistent with cutoff sign.  40 mm x 10 mm covered metal stent deployed in the distal CBD with good bile flow.  Difficult cannulation and PD was cannulated with wire left in place (2 wire technique) but no contrast injected into PD.  Course complicated by post ERCP pancreatitis.  Lipase 1111.  Was started on IVF overnight with NS at 125/hour.  Unfortunately, tray still delivered this morning.  Patient not eating and not endorsing much of an appetite.  Still with some ongoing abdominal pain, essentially stable from last evening.  No SOB.  Otherwise hemodynamically stable and afebrile.  CT completed overnight notable for acute uncomplicated pancreatitis.  CBD stent in place with pneumobilia (expected post ERCP with stent in place), diffuse hepatic metastatic disease.  Objective: Vital signs in last 24 hours: Temp:  [97.5 F (36.4 C)-98.4 F (36.9 C)] 98.4 F (36.9 C) (02/28 0546) Pulse Rate:  [57-91] 91 (02/28 0546) Resp:  [17-18] 18 (02/28 0546) BP: (107-129)/(56-75) 119/75 (02/28 0546) SpO2:  [95 %-97 %] 97 % (02/28 0546) Last BM Date: 12/05/19 General: NAD Lungs:  CTA b/l, no w/r/r Heart:  RRR, no m/r/g Abdomen: TTP in midepigastrium, abdomen soft, hypoactive bowel sounds Ext:  No c/c/e    Intake/Output from previous day: 02/27 0701 - 02/28 0700 In: 1291.8 [P.O.:360; I.V.:931.8] Out: 100 [Urine:100] Intake/Output this shift: No intake/output data recorded.   Lab Results: Recent Labs    12/05/19 1322  WBC 15.6*  HGB 10.8*  PLT 317  MCV 81.2   BMET Recent Labs    12/04/19 0548 12/05/19 1322  NA 137 138  K 3.6 3.8  CL 99 97*  CO2 26 27  GLUCOSE 102* 137*  BUN 18 29*   CREATININE 0.85 1.04*  CALCIUM 8.3* 8.3*   LFT Recent Labs    12/04/19 0548 12/05/19 1322  PROT 5.1* 5.4*  ALBUMIN 1.9* 2.0*  AST 96* 215*  ALT 69* 128*  ALKPHOS 303* 478*  BILITOT 11.3* 12.3*   PT/INR No results for input(s): INR in the last 72 hours.    Imaging/Other results: CT ABDOMEN PELVIS WO CONTRAST  Result Date: 12/05/2019 CLINICAL DATA:  Jaundice, biliary dilatation, increasing abdominal pain, history of diffuse liver metastases EXAM: CT ABDOMEN AND PELVIS WITHOUT CONTRAST TECHNIQUE: Multidetector CT imaging of the abdomen and pelvis was performed following the standard protocol without IV contrast. COMPARISON:  11/29/2019 FINDINGS: Lower chest: Subcentimeter pulmonary nodules at the lung bases are unchanged since recent chest CT, or some for metastatic disease. Minimal bilateral lower lobe atelectasis. Hepatobiliary: Numerous hepatic metastatic lesions are again identified unchanged. Pneumobilia consistent with recent endoscopy and biliary stent placement. High attenuation within the gallbladder either related to recent ERCP or vicarious excretion of previously administered intravenous contrast. No evidence of cholecystitis. Pancreas: Inflammatory changes surround the body and tail the pancreas consistent with acute uncomplicated pancreatitis. No fluid collection, pseudocyst, or abscess. This is new since previous exam. Spleen: Normal in size without focal abnormality. Adrenals/Urinary Tract: Stable nonobstructing 3 mm right renal calculus reference image 23. Otherwise the kidneys are unremarkable. Bladder is decompressed with no gross abnormalities. The adrenals are unremarkable. Stomach/Bowel: There is no bowel obstruction or ileus.  Vascular/Lymphatic: Stable atherosclerosis of the aorta. No discrete adenopathy identified on this unenhanced exam. Reproductive: Status post hysterectomy. No adnexal masses. Other: There is a small amount of free fluid within the left upper  quadrant, left paracolic gutter, and pelvis. No free intra-abdominal gas. Musculoskeletal: No acute or destructive bony lesions. Stable chronic T12 compression deformity and previous vertebral augmentation. Reconstructed images demonstrate no additional findings. IMPRESSION: 1. Interval development of acute uncomplicated pancreatitis. 2. Interval placement of a common bile duct stent, with pneumobilia confirming stent patency. 3. Diffuse hepatic metastatic disease. Please refer to recent ultrasound-guided biopsy results. 4. Small volume ascites within the left upper quadrant and pelvis. 5. Stable subcentimeter bilateral pulmonary nodules. Please refer to previous chest CT report for full description. 6. Stable 3 mm nonobstructing right renal calculus. Electronically Signed   By: Randa Ngo M.D.   On: 12/05/2019 19:15   DG ERCP BILIARY & PANCREATIC DUCTS  Result Date: 12/04/2019 : High-grade stricture and obstruction of the common bile duct CLINICAL DATA:  Metastatic adenocarcinoma to the liver and biliary obstruction. EXAM: ERCP TECHNIQUE: Multiple spot images obtained with the fluoroscopic device and submitted for interpretation post-procedure. COMPARISON:  CT of the abdomen and pelvis on 11/29/2019 and abdominal ultrasound on 11/30/2019. FINDINGS: Imaging during ERCP demonstrates cannulation of the common bile duct with contrast injection demonstrating high-grade stricture and obstruction of the distal common bile duct with dilatation of the proximal common bile duct and intrahepatic bile ducts. After crossing the level of obstruction, a self expanding metallic biliary stent was placed. The stent is constricted at the level of biliary obstruction. IMPRESSION: High-grade stricture and obstruction of the distal common bile duct after crossing the level of obstruction which was crossed during the procedure and treated with placement of a self expanding metallic biliary stent. These images were submitted for  radiologic interpretation only. Please see the procedural report for the amount of contrast and the fluoroscopy time utilized. Electronically Signed   By: Aletta Edouard M.D.   On: 12/04/2019 14:53      Assessment and Plan:  1) Metastatic adenocarcinoma 2) Malignant Bile duct obstruction 3) Post ERCP pancreatitis 4) Leukocytosis 5) Hyperbilirubinemia  85 year old female with metastatic adenocarcinoma of undetermined primary, s/p EUS and ERCP on 12/04/2019 with 4 cm x 10 mm covered metal stent placed in the bile duct.    Course complicated by post ERCP pancreatitis.  -Make strict n.p.o. now -Increase IVF to LR at 250 cc/hour (no history of heart failure) -Will follow-up on morning labs.  Continue daily CBC and CMP to assess for leukocytosis, hemoconcentration, renal injury, etc. -T bili essentially stable yesterday afternoon at 12.3.  Increase AST/ALT/ALP all likely due to pancreatitis and recent instrumentation, along with baseline elevations due to metastatic burden -Leukocytosis 2/2 pancreatitis -Antiemetics as needed -Pain control per primary service.  If requiring regular prn medications, suggest adding long-term basal pain med with use of prn meds for breakthrough -GI service will continue to follow.  Dr. Benson Norway will reassume this patient's care tomorrow morning     Lavena Bullion, DO  12/06/2019, 8:38 AM [facility] Pager [phone]

## 2019-12-06 NOTE — Plan of Care (Signed)
  Problem: Education: Goal: Knowledge of General Education information will improve Description Including pain rating scale, medication(s)/side effects and non-pharmacologic comfort measures Outcome: Progressing   

## 2019-12-06 NOTE — Plan of Care (Signed)
  Problem: Health Behavior/Discharge Planning: Goal: Ability to manage health-related needs will improve Outcome: Progressing   

## 2019-12-06 NOTE — Progress Notes (Signed)
PROGRESS NOTE    Shirley Sanders  DJT:[id number] DOB: 27-Apr-1935 DOA: 11/29/2019 PCP: Mellody Dance, DO   Brief Narrative:  Patient is 85 year old female with history of CVA, type 2diabetes mellitus, hypertension, hyperlipidemia who presents to the emergency department for the evaluation of back pain and jaundice.  Reported 2 to 3 weeks of ongoing lower back pain with radiation across her left flank to the upper abdomen/epigastric region.  Also reported poor oral intake and stated that her skin has appeared yellow.  Had some nausea.  She has chronic intermittent loose stools and was feeling weak.  On presentation she was hemodynamically stable.  Bilirubin was elevated at 13.3 with elevated liver enzymes, ALP of 372.  Chest x-ray showed unchanged punctuate calcified granuloma of left upper lobe.  CT abdomen/pelvis without contrast showed multiple poorly defined hepatic lesions concerning for metastatic disease to the liver, multiple small nodules throughout the visualized lung bases, mildly enlarged mesorectal lymph node, nonobstructive calculi in the right renal collecting system, moderate size hiatal hernia.  CT lumbar spine showed widespread degenerative lumbar spinal stenosis.  Ultrasound of the abdomen shows intra and extrahepatic biliary dilation, dilated CBD.  Oncology and GI consulted. Underwent liver biopsy on 12/03/2019 by IR.  Underwent  ERCP with  Stent placement on on 12/04/19  by GI. PT/OT evaluated her and recommended skilled nursing facility.  Palliative  care also consulted and recommended hospice at home. She developed severe abdominal pain on 12/05/2019.  Lipase elevated, CT abdomen/pelvis showed uncomplicated acute pancreatitis.  Currently on conservative management for pancreatitis.  Assessment & Plan:   Active Problems:   Hypertension associated with diabetes ([place])   Diabetes mellitus, type 2 ([place])   History of CVA with residual deficit   Hyperlipidemia associated with type 2  diabetes mellitus ([place])   Metastatic disease ([place])   Hyperbilirubinemia   Jaundice   Protein-calorie malnutrition, severe   Palliative care by specialist   Goals of care, counseling/discussion   DNR (do not resuscitate)   Biliary obstruction   Elevated liver enzymes   Epigastric pain   Leukocytosis   Metastatic adenocarcinoma ([place])   Hyperbilirubinemia/metastatic liver lesions:  Suspected metastatic disease of unknown primary.  CT imaging as above.  Oncology following.  Ultrasound of the abdomen as above. Underwent liver biopsy on 12/03/19.  CT chest with contrast for staging showed numerous subcentimeter bilateral pulmonary nodules. Underwent liver biopsy on 12/03/2019 by IR.  Underwent  ERCP with Stent placement in CBD on 12/04/19  by GI. We had a long conversation with daughter .  Given patient's advanced age, dementia, daughter is not very interested to pursue further aggressive treatment for her liver cancer.  Palliative care consulted.  Daughter agreeable with home with hospice  Acute post ERCP pancreatitis:She developed severe abdominal pain on 12/05/2019.  Lipase elevated, CT abdomen/pelvis showed uncomplicated acute pancreatitis.  Currently on conservative management for post ERCP acute pancreatitis. She also has  leucocytosis.Check CbC tomorrow.  No indication for antibiotic therapy for now.  Type 2 diabetes mellitus: On Actos at home.  Continue sliding scale insulin here.  Monitor CBGs  History of CVA: Continue statin.  Aspirin on hold for potential biopsy.  She has mild left residual hemiparesis.  She is ambulatory on baseline without  a walker or cane.  She lives by herself.PT/OT recommended skilled nursing facility but the plan is to send her to home with hospice  Hypertension: Currently normotensive.  Home chlorthalidone and lisinopril on hold  Hyperlipidemia: On statin .  We will hold because of elevated liver enzymes, hyperbilirubinemia.  Right thyroid nodule: CT scan  showed indeterminate right thyroid nodule of 1.8 cm .  Normal TSH.  Ultrasound of the thyroid showed hypoechoic 2.9 cm solid right  nodule with microcalcification in the right lower lobe.  Discussed with the daughter, and she does not want to pursue further investigation.  Debility/deconditioning: PT/OT consulted and recommended skilled nursing facility.  She is confused on and off.  Continue supportive care. She lives alone.  Daughter will be with patient at her home.  Home with hospice will be arranged    Nutrition Problem: Severe Malnutrition Etiology: acute illness(newly diagnosed metastatic disease)      DVT prophylaxis:SCD Code Status: DNR Family Communication: Discussed with daughter at bedside on 12/05/2019  disposition Plan: Patient is from home.  She is not stable for discharge today because she is being treated for post ERCP acute pancreatitis.  She also needs to be seen by case manager, hospice staff  and hospice equipment  should be arranged before discharge  Consultants: GI, oncology  Procedures: None  Antimicrobials:  Anti-infectives (From admission, onward)   None      Subjective:  Patient seen and examined at the bedside this morning.  Hemodynamically stable.  Abdomen pain is better today.  She was looking comfortable during my evaluation .  objective: Vitals:   12/05/19 0846 12/05/19 1630 12/05/19 2120 12/06/19 0546  BP: (!) 107/56 129/67 129/72 119/75  Pulse: (!) 57 71 78 91  Resp: 18 18 17 18   Temp: (!) 97.5 F (36.4 C) 97.7 F (36.5 C) 98.4 F (36.9 C) 98.4 F (36.9 C)  TempSrc: Oral Oral    SpO2: 96% 95% 97% 97%  Weight:      Height:        Intake/Output Summary (Last 24 hours) at 12/06/2019 5397 Last data filed at 12/06/2019 0546 Gross per 24 hour  Intake 1291.76 ml  Output 100 ml  Net 1191.76 ml   Filed Weights   11/29/19 2222 12/01/19 2205 12/02/19 2025  Weight: 44.5 kg 45 kg 45.5 kg    Examination:   General exam: Not in distress,  deconditioned, debilitated  Respiratory system: Bilateral equal air entry, normal vesicular breath sounds, no wheezes or crackles  Cardiovascular system: S1 & S2 heard, RRR. No JVD, murmurs, rubs, gallops or clicks. Gastrointestinal system: Abdomen is nondistended, soft , has mild generalized tenderness. no organomegaly or masses felt. Normal bowel sounds heard. Central nervous system: Alert and awake but not oriented extremities: No edema, no clubbing ,no cyanosis, distal peripheral pulses palpable. Skin: Icterus    Data Reviewed: I have personally reviewed following labs and imaging studies  CBC: Recent Labs  Lab 11/29/19 1530 11/30/19 0405 12/01/19 0459 12/05/19 1322  WBC 11.7* 8.9 8.1 15.6*  NEUTROABS  --   --  6.2 14.7*  HGB 11.1* 8.6* 9.4* 10.8*  HCT 34.4* 27.5* 29.7* 33.7*  MCV 80.8 81.1 81.8 81.2  PLT 412* 284 281 673   Basic Metabolic Panel: Recent Labs  Lab 12/01/19 0459 12/02/19 0439 12/03/19 0434 12/04/19 0548 12/05/19 1322  NA 136 132* 134* 137 138  K 4.0 4.2 3.7 3.6 3.8  CL 98 95* 97* 99 97*  CO2 27 22 24 26 27   GLUCOSE 80 165* 118* 102* 137*  BUN 13 15 18 18  29*  CREATININE 0.95 1.02* 0.97 0.85 1.04*  CALCIUM 8.6* 8.3* 8.4* 8.3* 8.3*   GFR: Estimated Creatinine Clearance: 30 mL/min (A) (by C-G  formula based on SCr of 1.04 mg/dL (H)). Liver Function Tests: Recent Labs  Lab 12/01/19 0459 12/02/19 0439 12/03/19 0434 12/04/19 0548 12/05/19 1322  AST 107* 91* 93* 96* 215*  ALT 56* 60* 64* 69* 128*  ALKPHOS 290* 296* 317* 303* 478*  BILITOT 11.4* 12.1* 11.3* 11.3* 12.3*  PROT 5.4* 5.6* 5.3* 5.1* 5.4*  ALBUMIN 1.9* 2.0* 2.1* 1.9* 2.0*   Recent Labs  Lab 11/29/19 1530 12/05/19 1322  LIPASE 30 1,111*   No results for input(s): AMMONIA in the last 168 hours. Coagulation Profile: Recent Labs  Lab 12/01/19 1016 12/02/19 0727 12/03/19 0434  INR 1.6* 1.7* 1.6*   Cardiac Enzymes: No results for input(s): CKTOTAL, CKMB, CKMBINDEX, TROPONINI in  the last 168 hours. BNP (last 3 results) No results for input(s): PROBNP in the last 8760 hours. HbA1C: No results for input(s): HGBA1C in the last 72 hours. CBG: Recent Labs  Lab 12/05/19 0734 12/05/19 1111 12/05/19 1626 12/05/19 2120 12/06/19 0655  GLUCAP 111* 152* 145* 118* 94   Lipid Profile: No results for input(s): CHOL, HDL, LDLCALC, TRIG, CHOLHDL, LDLDIRECT in the last 72 hours. Thyroid Function Tests: No results for input(s): TSH, T4TOTAL, FREET4, T3FREE, THYROIDAB in the last 72 hours. Anemia Panel: No results for input(s): VITAMINB12, FOLATE, FERRITIN, TIBC, IRON, RETICCTPCT in the last 72 hours. Sepsis Labs: No results for input(s): PROCALCITON, LATICACIDVEN in the last 168 hours.  Recent Results (from the past 240 hour(s))  SARS CORONAVIRUS 2 (TAT 6-24 HRS) Nasopharyngeal Nasopharyngeal Swab     Status: None   Collection Time: 11/29/19  7:52 PM   Specimen: Nasopharyngeal Swab  Result Value Ref Range Status   SARS Coronavirus 2 NEGATIVE NEGATIVE Final    Comment: (NOTE) SARS-CoV-2 target nucleic acids are NOT DETECTED. The SARS-CoV-2 RNA is generally detectable in upper and lower respiratory specimens during the acute phase of infection. Negative results do not preclude SARS-CoV-2 infection, do not rule out co-infections with other pathogens, and should not be used as the sole basis for treatment or other patient management decisions. Negative results must be combined with clinical observations, patient history, and epidemiological information. The expected result is Negative. Fact Sheet for Patients: SugarRoll.be Fact Sheet for Healthcare Providers: https://www.woods-mathews.com/ This test is not yet approved or cleared by the [place] FDA and  has been authorized for detection and/or diagnosis of SARS-CoV-2 by FDA under an Emergency Use Authorization (EUA). This EUA will remain  in effect (meaning this test can  be used) for the duration of the COVID-19 declaration under Section 56 4(b)(1) of the Act, 21 U.S.C. section 360bbb-3(b)(1), unless the authorization is terminated or revoked sooner. Performed at [facility], [place] [address] [place], [place] [address]          Radiology Studies: CT ABDOMEN PELVIS WO CONTRAST  Result Date: 12/05/2019 CLINICAL DATA:  Jaundice, biliary dilatation, increasing abdominal pain, history of diffuse liver metastases EXAM: CT ABDOMEN AND PELVIS WITHOUT CONTRAST TECHNIQUE: Multidetector CT imaging of the abdomen and pelvis was performed following the standard protocol without IV contrast. COMPARISON:  11/29/2019 FINDINGS: Lower chest: Subcentimeter pulmonary nodules at the lung bases are unchanged since recent chest CT, or some for metastatic disease. Minimal bilateral lower lobe atelectasis. Hepatobiliary: Numerous hepatic metastatic lesions are again identified unchanged. Pneumobilia consistent with recent endoscopy and biliary stent placement. High attenuation within the gallbladder either related to recent ERCP or vicarious excretion of previously administered intravenous contrast. No evidence of cholecystitis. Pancreas: Inflammatory changes surround the  body and tail the pancreas consistent with acute uncomplicated pancreatitis. No fluid collection, pseudocyst, or abscess. This is new since previous exam. Spleen: Normal in size without focal abnormality. Adrenals/Urinary Tract: Stable nonobstructing 3 mm right renal calculus reference image 23. Otherwise the kidneys are unremarkable. Bladder is decompressed with no gross abnormalities. The adrenals are unremarkable. Stomach/Bowel: There is no bowel obstruction or ileus. Vascular/Lymphatic: Stable atherosclerosis of the aorta. No discrete adenopathy identified on this unenhanced exam. Reproductive: Status post hysterectomy. No adnexal masses. Other: There is a small amount of free fluid within the left upper quadrant,  left paracolic gutter, and pelvis. No free intra-abdominal gas. Musculoskeletal: No acute or destructive bony lesions. Stable chronic T12 compression deformity and previous vertebral augmentation. Reconstructed images demonstrate no additional findings. IMPRESSION: 1. Interval development of acute uncomplicated pancreatitis. 2. Interval placement of a common bile duct stent, with pneumobilia confirming stent patency. 3. Diffuse hepatic metastatic disease. Please refer to recent ultrasound-guided biopsy results. 4. Small volume ascites within the left upper quadrant and pelvis. 5. Stable subcentimeter bilateral pulmonary nodules. Please refer to previous chest CT report for full description. 6. Stable 3 mm nonobstructing right renal calculus. Electronically Signed   By: Randa Ngo M.D.   On: 12/05/2019 19:15   DG ERCP BILIARY & PANCREATIC DUCTS  Result Date: 12/04/2019 : High-grade stricture and obstruction of the common bile duct CLINICAL DATA:  Metastatic adenocarcinoma to the liver and biliary obstruction. EXAM: ERCP TECHNIQUE: Multiple spot images obtained with the fluoroscopic device and submitted for interpretation post-procedure. COMPARISON:  CT of the abdomen and pelvis on 11/29/2019 and abdominal ultrasound on 11/30/2019. FINDINGS: Imaging during ERCP demonstrates cannulation of the common bile duct with contrast injection demonstrating high-grade stricture and obstruction of the distal common bile duct with dilatation of the proximal common bile duct and intrahepatic bile ducts. After crossing the level of obstruction, a self expanding metallic biliary stent was placed. The stent is constricted at the level of biliary obstruction. IMPRESSION: High-grade stricture and obstruction of the distal common bile duct after crossing the level of obstruction which was crossed during the procedure and treated with placement of a self expanding metallic biliary stent. These images were submitted for radiologic  interpretation only. Please see the procedural report for the amount of contrast and the fluoroscopy time utilized. Electronically Signed   By: Aletta Edouard M.D.   On: 12/04/2019 14:53        Scheduled Meds: . insulin aspart  0-9 Units Subcutaneous TID WC  . lactose free nutrition  237 mL Oral TID WC  . multivitamin with minerals  1 tablet Oral Daily   Continuous Infusions: . sodium chloride 125 mL/hr at 12/06/19 0400     LOS: 6 days    Time spent: 35 mins.More than 50% of that time was spent in counseling and/or coordination of care.      Shelly Coss, MD [facility] P2/28/2021, 8:32 AM

## 2019-12-07 LAB — CBC WITH DIFFERENTIAL/PLATELET
Abs Immature Granulocytes: 0.17 10*3/uL — ABNORMAL HIGH (ref 0.00–0.07)
Basophils Absolute: 0 10*3/uL (ref 0.0–0.1)
Basophils Relative: 0 %
Eosinophils Absolute: 0 10*3/uL (ref 0.0–0.5)
Eosinophils Relative: 0 %
HCT: 31 % — ABNORMAL LOW (ref 36.0–46.0)
Hemoglobin: 9.7 g/dL — ABNORMAL LOW (ref 12.0–15.0)
Immature Granulocytes: 1 %
Lymphocytes Relative: 4 %
Lymphs Abs: 0.6 10*3/uL — ABNORMAL LOW (ref 0.7–4.0)
MCH: 25.7 pg — ABNORMAL LOW (ref 26.0–34.0)
MCHC: 31.3 g/dL (ref 30.0–36.0)
MCV: 82.2 fL (ref 80.0–100.0)
Monocytes Absolute: 0.7 10*3/uL (ref 0.1–1.0)
Monocytes Relative: 4 %
Neutro Abs: 14.1 10*3/uL — ABNORMAL HIGH (ref 1.7–7.7)
Neutrophils Relative %: 91 %
Platelets: 255 10*3/uL (ref 150–400)
RBC: 3.77 MIL/uL — ABNORMAL LOW (ref 3.87–5.11)
RDW: 23.1 % — ABNORMAL HIGH (ref 11.5–15.5)
WBC: 15.6 10*3/uL — ABNORMAL HIGH (ref 4.0–10.5)
nRBC: 0.1 % (ref 0.0–0.2)

## 2019-12-07 LAB — COMPREHENSIVE METABOLIC PANEL
ALT: 102 U/L — ABNORMAL HIGH (ref 0–44)
AST: 117 U/L — ABNORMAL HIGH (ref 15–41)
Albumin: 1.5 g/dL — ABNORMAL LOW (ref 3.5–5.0)
Alkaline Phosphatase: 394 U/L — ABNORMAL HIGH (ref 38–126)
Anion gap: 11 (ref 5–15)
BUN: 19 mg/dL (ref 8–23)
CO2: 23 mmol/L (ref 22–32)
Calcium: 7.4 mg/dL — ABNORMAL LOW (ref 8.9–10.3)
Chloride: 105 mmol/L (ref 98–111)
Creatinine, Ser: 0.78 mg/dL (ref 0.44–1.00)
GFR calc Af Amer: >60 mL/min (ref >60)
GFR calc non Af Amer: >60 mL/min (ref >60)
Glucose, Bld: 106 mg/dL — ABNORMAL HIGH (ref 70–99)
Potassium: 3.8 mmol/L (ref 3.5–5.1)
Sodium: 139 mmol/L (ref 135–145)
Total Bilirubin: 7.7 mg/dL — ABNORMAL HIGH (ref 0.3–1.2)
Total Protein: 4.6 g/dL — ABNORMAL LOW (ref 6.5–8.1)

## 2019-12-07 LAB — GLUCOSE, CAPILLARY
Glucose-Capillary: 103 mg/dL — ABNORMAL HIGH (ref 70–99)
Glucose-Capillary: 100 mg/dL — ABNORMAL HIGH (ref 70–99)
Glucose-Capillary: 101 mg/dL — ABNORMAL HIGH (ref 70–99)
Glucose-Capillary: 95 mg/dL (ref 70–99)

## 2019-12-07 LAB — SURGICAL PATHOLOGY

## 2019-12-07 NOTE — Progress Notes (Signed)
PROGRESS NOTE    Shirley Sanders  XKG:[id number] DOB: 06/03/1935 DOA: 11/29/2019 PCP: Mellody Dance, DO   Brief Narrative:  Patient is 85 year old female with history of CVA, type 2diabetes mellitus, hypertension, hyperlipidemia who presents to the emergency department for the evaluation of back pain and jaundice.  Reported 2 to 3 weeks of ongoing lower back pain with radiation across her left flank to the upper abdomen/epigastric region.  Also reported poor oral intake and stated that her skin has appeared yellow.  Had some nausea.  She has chronic intermittent loose stools and was feeling weak.  On presentation she was hemodynamically stable.  Bilirubin was elevated at 13.3 with elevated liver enzymes, ALP of 372.  Chest x-ray showed unchanged punctuate calcified granuloma of left upper lobe.  CT abdomen/pelvis without contrast showed multiple poorly defined hepatic lesions concerning for metastatic disease to the liver, multiple small nodules throughout the visualized lung bases, mildly enlarged mesorectal lymph node, nonobstructive calculi in the right renal collecting system, moderate size hiatal hernia.  CT lumbar spine showed widespread degenerative lumbar spinal stenosis.  Ultrasound of the abdomen shows intra and extrahepatic biliary dilation, dilated CBD.  Oncology and GI consulted. Underwent liver biopsy on 12/03/2019 by IR.  Underwent  ERCP with  Stent placement on on 12/04/19  by GI. PT/OT evaluated her and recommended skilled nursing facility.  Palliative  care also consulted and recommended hospice at home. She developed severe abdominal pain on 12/05/2019.  Lipase elevated, CT abdomen/pelvis showed uncomplicated acute pancreatitis.  Currently on conservative management for pancreatitis.  Assessment & Plan:   Active Problems:   Hypertension associated with diabetes ([place])   Diabetes mellitus, type 2 ([place])   History of CVA with residual deficit   Hyperlipidemia associated with type 2  diabetes mellitus ([place])   Metastatic disease ([place])   Hyperbilirubinemia   Jaundice   Protein-calorie malnutrition, severe   Palliative care by specialist   Goals of care, counseling/discussion   DNR (do not resuscitate)   Biliary obstruction   Elevated liver enzymes   Epigastric pain   Leukocytosis   Metastatic adenocarcinoma ([place])   Acute pancreatitis   Hyperbilirubinemia/metastatic liver lesions:  Suspected metastatic disease of unknown primary.  CT imaging as above.  Oncology following.  Ultrasound of the abdomen as above. Underwent liver biopsy on 12/03/19.  CT chest with contrast for staging showed numerous subcentimeter bilateral pulmonary nodules. Underwent liver biopsy on 12/03/2019 by IR.  Underwent  ERCP with Stent placement in CBD on 12/04/19  by GI. We had a long conversation with daughter .  Given patient's advanced age, dementia, daughter is not very interested to pursue further aggressive treatment for her liver cancer.  Palliative care consulted.  Daughter agreeable with home with hospice  Acute post ERCP pancreatitis:She developed severe abdominal pain on 12/05/2019.  Lipase elevated, CT abdomen/pelvis showed uncomplicated acute pancreatitis.  Currently on conservative management for post ERCP acute pancreatitis. She also has some  leucocytosis.Check CbC tomorrow.  No indication for antibiotic therapy for now.  Type 2 diabetes mellitus: On Actos at home.  Continue sliding scale insulin here.  Monitor CBGs  History of CVA: Continue statin.  Aspirin on hold for potential biopsy.  She has mild left residual hemiparesis.  She is ambulatory on baseline without  a walker or cane.  She lives by herself.PT/OT recommended skilled nursing facility but the plan is to send her to home with hospice  Hypertension: Currently normotensive.  Home chlorthalidone and lisinopril on hold  Hyperlipidemia: On statin .  We will hold because of elevated liver enzymes, hyperbilirubinemia.  Right  thyroid nodule: CT scan showed indeterminate right thyroid nodule of 1.8 cm .  Normal TSH.  Ultrasound of the thyroid showed hypoechoic 2.9 cm solid right  nodule with microcalcification in the right lower lobe.  Discussed with the daughter, and she does not want to pursue further investigation.  Debility/deconditioning: PT/OT consulted and recommended skilled nursing facility.  She is confused on and off.  Continue supportive care. She lives alone.  Daughter will be with patient at her home.  Home with hospice will be arranged    Nutrition Problem: Severe Malnutrition Etiology: acute illness(newly diagnosed metastatic disease)      DVT prophylaxis:SCD Code Status: DNR Family Communication: Discussed with daughter at bedside on 12/05/2019  disposition Plan: Patient is from home.  She is not stable for discharge today because she is being treated for post ERCP acute pancreatitis. She still complains of abdomen pain today.  Consultants: GI, oncology  Procedures: None  Antimicrobials:  Anti-infectives (From admission, onward)   None      Subjective:  Patient seen and examined the bedside this morning.  She said her abdominal pain started about 30 minutes ago .  Currently stable, afebrile.  Need to continue n.p.o. status because of abdominal pain.  objective: Vitals:   12/06/19 1620 12/06/19 2110 12/07/19 0532 12/07/19 0934  BP: 127/69 118/66 118/72 122/63  Pulse: 86 82 87 79  Resp: 18 17 17 16   Temp: 98.1 F (36.7 C) 97.8 F (36.6 C) 98.1 F (36.7 C) 97.6 F (36.4 C)  TempSrc: Oral Oral  Oral  SpO2: 96% 96% 95% 96%  Weight:  45.6 kg    Height:        Intake/Output Summary (Last 24 hours) at 12/07/2019 1313 Last data filed at 12/07/2019 7902 Gross per 24 hour  Intake 994.64 ml  Output 100 ml  Net 894.64 ml   Filed Weights   12/02/19 2025 12/05/19 2120 12/06/19 2110  Weight: 45.5 kg 45.6 kg 45.6 kg    Examination:   General exam: In mild to moderate distress due  to abdominal pain, deconditioned, debilitated Respiratory system: Bilateral equal air entry, normal vesicular breath sounds, no wheezes or crackles  Cardiovascular system: S1 & S2 heard, RRR. No JVD, murmurs, rubs, gallops or clicks. Gastrointestinal system: Abdomen is nondistended, soft and tender mainly in the epigastric region.   Central nervous system: Alert and oriented. No focal neurological deficits. Extremities: No edema, no clubbing ,no cyanosis Skin: Icterus   Data Reviewed: I have personally reviewed following labs and imaging studies  CBC: Recent Labs  Lab 12/01/19 0459 12/05/19 1322 12/06/19 0846 12/07/19 0258  WBC 8.1 15.6* 17.2* 15.6*  NEUTROABS 6.2 14.7* 16.9* 14.1*  HGB 9.4* 10.8* 10.9* 9.7*  HCT 29.7* 33.7* 33.5* 31.0*  MCV 81.8 81.2 79.6* 82.2  PLT 281 317 319 409   Basic Metabolic Panel: Recent Labs  Lab 12/03/19 0434 12/04/19 0548 12/05/19 1322 12/06/19 0846 12/07/19 0258  NA 134* 137 138 137 139  K 3.7 3.6 3.8 4.9 3.8  CL 97* 99 97* 101 105  CO2 24 26 27 25 23   GLUCOSE 118* 102* 137* 117* 106*  BUN 18 18 29* 27* 19  CREATININE 0.97 0.85 1.04* 0.89 0.78  CALCIUM 8.4* 8.3* 8.3* 7.7* 7.4*   GFR: Estimated Creatinine Clearance: 38.9 mL/min (by C-G formula based on SCr of 0.78 mg/dL). Liver Function Tests: Recent Labs  Lab  12/03/19 0434 12/04/19 0548 12/05/19 1322 12/06/19 0846 12/07/19 0258  AST 93* 96* 215* 172* 117*  ALT 64* 69* 128* 124* 102*  ALKPHOS 317* 303* 478* 436* 394*  BILITOT 11.3* 11.3* 12.3* 10.2* 7.7*  PROT 5.3* 5.1* 5.4* 5.1* 4.6*  ALBUMIN 2.1* 1.9* 2.0* 1.9* 1.5*   Recent Labs  Lab 12/05/19 1322  LIPASE 1,111*   No results for input(s): AMMONIA in the last 168 hours. Coagulation Profile: Recent Labs  Lab 12/01/19 1016 12/02/19 0727 12/03/19 0434  INR 1.6* 1.7* 1.6*   Cardiac Enzymes: No results for input(s): CKTOTAL, CKMB, CKMBINDEX, TROPONINI in the last 168 hours. BNP (last 3 results) No results for  input(s): PROBNP in the last 8760 hours. HbA1C: No results for input(s): HGBA1C in the last 72 hours. CBG: Recent Labs  Lab 12/06/19 0655 12/06/19 1123 12/06/19 1618 12/07/19 0727 12/07/19 1136  GLUCAP 94 109* 116* 103* 100*   Lipid Profile: No results for input(s): CHOL, HDL, LDLCALC, TRIG, CHOLHDL, LDLDIRECT in the last 72 hours. Thyroid Function Tests: No results for input(s): TSH, T4TOTAL, FREET4, T3FREE, THYROIDAB in the last 72 hours. Anemia Panel: No results for input(s): VITAMINB12, FOLATE, FERRITIN, TIBC, IRON, RETICCTPCT in the last 72 hours. Sepsis Labs: No results for input(s): PROCALCITON, LATICACIDVEN in the last 168 hours.  Recent Results (from the past 240 hour(s))  SARS CORONAVIRUS 2 (TAT 6-24 HRS) Nasopharyngeal Nasopharyngeal Swab     Status: None   Collection Time: 11/29/19  7:52 PM   Specimen: Nasopharyngeal Swab  Result Value Ref Range Status   SARS Coronavirus 2 NEGATIVE NEGATIVE Final    Comment: (NOTE) SARS-CoV-2 target nucleic acids are NOT DETECTED. The SARS-CoV-2 RNA is generally detectable in upper and lower respiratory specimens during the acute phase of infection. Negative results do not preclude SARS-CoV-2 infection, do not rule out co-infections with other pathogens, and should not be used as the sole basis for treatment or other patient management decisions. Negative results must be combined with clinical observations, patient history, and epidemiological information. The expected result is Negative. Fact Sheet for Patients: SugarRoll.be Fact Sheet for Healthcare Providers: https://www.woods-mathews.com/ This test is not yet approved or cleared by the [place] FDA and  has been authorized for detection and/or diagnosis of SARS-CoV-2 by FDA under an Emergency Use Authorization (EUA). This EUA will remain  in effect (meaning this test can be used) for the duration of the COVID-19 declaration  under Section 56 4(b)(1) of the Act, 21 U.S.C. section 360bbb-3(b)(1), unless the authorization is terminated or revoked sooner. Performed at [facility], [place] [address], [place], [place] [address]          Radiology Studies: CT ABDOMEN PELVIS WO CONTRAST  Result Date: 12/05/2019 CLINICAL DATA:  Jaundice, biliary dilatation, increasing abdominal pain, history of diffuse liver metastases EXAM: CT ABDOMEN AND PELVIS WITHOUT CONTRAST TECHNIQUE: Multidetector CT imaging of the abdomen and pelvis was performed following the standard protocol without IV contrast. COMPARISON:  11/29/2019 FINDINGS: Lower chest: Subcentimeter pulmonary nodules at the lung bases are unchanged since recent chest CT, or some for metastatic disease. Minimal bilateral lower lobe atelectasis. Hepatobiliary: Numerous hepatic metastatic lesions are again identified unchanged. Pneumobilia consistent with recent endoscopy and biliary stent placement. High attenuation within the gallbladder either related to recent ERCP or vicarious excretion of previously administered intravenous contrast. No evidence of cholecystitis. Pancreas: Inflammatory changes surround the body and tail the pancreas consistent with acute uncomplicated pancreatitis. No fluid collection, pseudocyst, or abscess. This is  new since previous exam. Spleen: Normal in size without focal abnormality. Adrenals/Urinary Tract: Stable nonobstructing 3 mm right renal calculus reference image 23. Otherwise the kidneys are unremarkable. Bladder is decompressed with no gross abnormalities. The adrenals are unremarkable. Stomach/Bowel: There is no bowel obstruction or ileus. Vascular/Lymphatic: Stable atherosclerosis of the aorta. No discrete adenopathy identified on this unenhanced exam. Reproductive: Status post hysterectomy. No adnexal masses. Other: There is a small amount of free fluid within the left upper quadrant, left paracolic gutter, and pelvis. No free  intra-abdominal gas. Musculoskeletal: No acute or destructive bony lesions. Stable chronic T12 compression deformity and previous vertebral augmentation. Reconstructed images demonstrate no additional findings. IMPRESSION: 1. Interval development of acute uncomplicated pancreatitis. 2. Interval placement of a common bile duct stent, with pneumobilia confirming stent patency. 3. Diffuse hepatic metastatic disease. Please refer to recent ultrasound-guided biopsy results. 4. Small volume ascites within the left upper quadrant and pelvis. 5. Stable subcentimeter bilateral pulmonary nodules. Please refer to previous chest CT report for full description. 6. Stable 3 mm nonobstructing right renal calculus. Electronically Signed   By: Randa Ngo M.D.   On: 12/05/2019 19:15        Scheduled Meds: . insulin aspart  0-9 Units Subcutaneous TID WC  . lactose free nutrition  237 mL Oral TID WC  . multivitamin with minerals  1 tablet Oral Daily   Continuous Infusions: . lactated ringers Stopped (12/07/19 1310)     LOS: 7 days    Time spent: 35 mins.More than 50% of that time was spent in counseling and/or coordination of care.      Shelly Coss, MD [facility] P3/10/2019, 1:13 PM

## 2019-12-07 NOTE — Progress Notes (Signed)
Palliative Medicine RN Note: FMLA paperwork completed for daughter Joelene Millin and faxed to [phone].  Marjie Skiff Vivia Rosenburg, RN, BSN, [facility] Palliative Medicine Team 12/07/2019 12:08 PM Office [phone]

## 2019-12-07 NOTE — Progress Notes (Signed)
Physical Therapy Treatment Patient Details Name: Shirley Sanders MRN: [id number] DOB: March 09, 1935 Today's Date: 12/07/2019    History of Present Illness 85 year old female with history of CVA, type 2diabetes mellitus, hypertension, hyperlipidemia who presented to the emergency department for the evaluation of back pain and jaundice. CT abd/pelvis w/o contrast was notable for multiple poorly defined hepatic lesions concerning for metastatic disease.    PT Comments    Pt was seen for mobility and note her L side is more limiting and is not HHA with gait today.  Talked with pt and daughter about a platform walker vs hemiwalker for comparison, but given pt weakness is likely best helped with hemiwalker.  Pt is still considerably weaker today and still recommending a rehab stay to increase her control of balance and transfers.  Daughter is planning to take her home per her report.  Acute therapy to continue as planned.   Follow Up Recommendations  SNF     Equipment Recommendations  None recommended by PT    Recommendations for Other Services       Precautions / Restrictions Precautions Precautions: Fall Precaution Comments: LUE and LLE hemi Restrictions Weight Bearing Restrictions: No    Mobility  Bed Mobility Overal bed mobility: Needs Assistance Bed Mobility: Supine to Sit;Sit to Supine     Supine to sit: Min assist Sit to supine: Min assist   General bed mobility comments: dense cues for sequence and safety  Transfers Overall transfer level: Needs assistance Equipment used: 1 person hand held assist Transfers: Sit to/from Stand Sit to Stand: Mod assist         General transfer comment: mod to power up and control L side weakness  Ambulation/Gait Ambulation/Gait assistance: Mod assist Gait Distance (Feet): 6 Feet Assistive device: 1 person hand held assist Gait Pattern/deviations: Step-to pattern;Decreased stride length;Wide base of support Gait velocity:  decreased Gait velocity interpretation: <1.31 ft/sec, indicative of household ambulator General Gait Details: distance limited by her fatigue and low energy   Stairs             Wheelchair Mobility    Modified Rankin (Stroke Patients Only)       Balance Overall balance assessment: Needs assistance Sitting-balance support: Feet supported;Bilateral upper extremity supported Sitting balance-Leahy Scale: Fair     Standing balance support: Bilateral upper extremity supported;During functional activity Standing balance-Leahy Scale: Poor                              Cognition Arousal/Alertness: Awake/alert Behavior During Therapy: Flat affect Overall Cognitive Status: Difficult to assess                                 General Comments: Daughter present during eval.      Exercises      General Comments General comments (skin integrity, edema, etc.): Pt is up to side of bed but quite weak, unable to walk far and talked with she and her daughter about hemiwalker vs Platfom walker      Pertinent Vitals/Pain Pain Assessment: No/denies pain    Home Living                      Prior Function            PT Goals (current goals can now be found in the care plan section) Acute Rehab PT Goals  Patient Stated Goal: home Progress towards PT goals: Progressing toward goals    Frequency    Min 3X/week      PT Plan Current plan remains appropriate    Co-evaluation              AM-PAC PT "6 Clicks" Mobility   Outcome Measure  Help needed turning from your back to your side while in a flat bed without using bedrails?: A Little Help needed moving from lying on your back to sitting on the side of a flat bed without using bedrails?: A Lot Help needed moving to and from a bed to a chair (including a wheelchair)?: A Lot Help needed standing up from a chair using your arms (e.g., wheelchair or bedside chair)?: A Lot Help needed  to walk in hospital room?: A Lot Help needed climbing 3-5 steps with a railing? : A Lot 6 Click Score: 13    End of Session Equipment Utilized During Treatment: Gait belt Activity Tolerance: Patient limited by fatigue Patient left: in bed;with call bell/phone within reach;with family/visitor present Nurse Communication: Mobility status PT Visit Diagnosis: Muscle weakness (generalized) (M62.81);Unsteadiness on feet (R26.81);Pain     Time: 0518-3358 PT Time Calculation (min) (ACUTE ONLY): 17 min  Charges:  $Therapeutic Activity: 8-22 mins                     Ramond Dial 12/07/2019, 5:04 PM   Mee Hives, PT MS Acute Rehab Dept. Number: [place]

## 2019-12-07 NOTE — Plan of Care (Signed)
  Problem: Clinical Measurements: Goal: Will remain free from infection Outcome: Progressing   Problem: Activity: Goal: Risk for activity intolerance will decrease Outcome: Progressing   

## 2019-12-07 NOTE — Progress Notes (Signed)
Nutrition Follow up  DOCUMENTATION CODES:   Severe malnutrition in context of acute illness/injury  INTERVENTION:   Diet advancement as tolerated.    Boost Plus chocolate TID- Each supplement provides 360kcal and 14g protein for comfort  MVI daily   NUTRITION DIAGNOSIS:   Severe Malnutrition related to acute illness(newly diagnosed metastatic disease) as evidenced by severe muscle depletion, moderate fat depletion, percent weight loss, energy intake < or equal to 50% for > or equal to 5 days.  Ongoing  GOAL:   Patient will meet greater than or equal to 90% of their needs   NPO  MONITOR:   PO intake, Supplement acceptance, Weight trends, Labs, I & O's, Diet advancement, Skin  REASON FOR ASSESSMENT:   Malnutrition Screening Tool    ASSESSMENT:   Patient with PMH significant for CVA, DM, HTN, and HLD. Presents this admission with hyperbilirubinemia with suspected metastatic disease of unknown primary.   2/25- s/p liver biopsy  2/26- s/p ERCP/upper EUS, stenting   Intake remains inadequate. Meal completions charted as 0% for last eight meals. Complaining of abdominal pain this am. Being kept NPO due to this. Plan d/c home with hospice. Continue supplements for comfort once diet is advanced.   Admission weight: 44.6 kg  Current weight: 45.6 kg   I/O: +4,237 ml since admit  UOP: 300 ml x 24 hrs   Drips: LR @ 150 ml/hr  Medications: SS novolog, MVI with minerals  Labs: LFTs elevated CBG 94-118   Diet Order:   Diet Order            Diet NPO time specified  Diet effective now              EDUCATION NEEDS:   Education needs have been addressed  Skin:  Skin Assessment: Skin Integrity Issues: Skin Integrity Issues:: Other (Comment) Other: MASD- L/R breast  Last BM:  2/27  Height:   Ht Readings from Last 1 Encounters:  11/29/19 5' (1.524 m)    Weight:   Wt Readings from Last 1 Encounters:  12/06/19 45.6 kg    Ideal Body Weight:  45.5  kg  BMI:  Body mass index is 19.64 kg/m.  Estimated Nutritional Needs:   Kcal:  1400-1600 kcal  Protein:  70-90 grams  Fluid:  >/= 1.4 L/day   Mariana Single RD, LDN Clinical Nutrition Pager listed in [place]

## 2019-12-07 NOTE — Progress Notes (Signed)
I responded to spiritual care consult. Shirley Sanders was awake and daughter Shirley Sanders) was at bedside. Shirley Sanders said she is doing much better today. She said that her plan was to go home soon ([place]). Shirley Sanders said she was going to  care for her. Shirley Sanders said she hoped that her son could visit too, but knows that their are policies in place.  Shirley Sanders said she wished her pastor could visit, but voiced that they still have a team of members reaching out to her by face-book. She said they are actively praying for her. I offered them space to voice concerns, and spiritual care with words of comfort, and ministry of presence. Shirley Sanders and Shirley Sanders voiced their appreciation for the staff care and chaplain visit. Chaplain available as needed.    Palliative care  Chaplain resident Fidel Levy [phone]

## 2019-12-08 LAB — CBC WITH DIFFERENTIAL/PLATELET
Abs Immature Granulocytes: 0.13 10*3/uL — ABNORMAL HIGH (ref 0.00–0.07)
Basophils Absolute: 0 10*3/uL (ref 0.0–0.1)
Basophils Relative: 0 %
Eosinophils Absolute: 0 10*3/uL (ref 0.0–0.5)
Eosinophils Relative: 0 %
HCT: 29.7 % — ABNORMAL LOW (ref 36.0–46.0)
Hemoglobin: 9.4 g/dL — ABNORMAL LOW (ref 12.0–15.0)
Immature Granulocytes: 1 %
Lymphocytes Relative: 3 %
Lymphs Abs: 0.4 10*3/uL — ABNORMAL LOW (ref 0.7–4.0)
MCH: 25.6 pg — ABNORMAL LOW (ref 26.0–34.0)
MCHC: 31.6 g/dL (ref 30.0–36.0)
MCV: 80.9 fL (ref 80.0–100.0)
Monocytes Absolute: 0.8 10*3/uL (ref 0.1–1.0)
Monocytes Relative: 5 %
Neutro Abs: 12.8 10*3/uL — ABNORMAL HIGH (ref 1.7–7.7)
Neutrophils Relative %: 91 %
Platelets: 266 10*3/uL (ref 150–400)
RBC: 3.67 MIL/uL — ABNORMAL LOW (ref 3.87–5.11)
RDW: 23.4 % — ABNORMAL HIGH (ref 11.5–15.5)
WBC: 14.1 10*3/uL — ABNORMAL HIGH (ref 4.0–10.5)
nRBC: 0 % (ref 0.0–0.2)

## 2019-12-08 LAB — GLUCOSE, CAPILLARY
Glucose-Capillary: 98 mg/dL (ref 70–99)
Glucose-Capillary: 109 mg/dL — ABNORMAL HIGH (ref 70–99)
Glucose-Capillary: 112 mg/dL — ABNORMAL HIGH (ref 70–99)
Glucose-Capillary: 115 mg/dL — ABNORMAL HIGH (ref 70–99)

## 2019-12-08 LAB — COMPREHENSIVE METABOLIC PANEL
ALT: 115 U/L — ABNORMAL HIGH (ref 0–44)
AST: 174 U/L — ABNORMAL HIGH (ref 15–41)
Albumin: 1.4 g/dL — ABNORMAL LOW (ref 3.5–5.0)
Alkaline Phosphatase: 507 U/L — ABNORMAL HIGH (ref 38–126)
Anion gap: 11 (ref 5–15)
BUN: 25 mg/dL — ABNORMAL HIGH (ref 8–23)
CO2: 22 mmol/L (ref 22–32)
Calcium: 7.5 mg/dL — ABNORMAL LOW (ref 8.9–10.3)
Chloride: 105 mmol/L (ref 98–111)
Creatinine, Ser: 0.76 mg/dL (ref 0.44–1.00)
GFR calc Af Amer: >60 mL/min (ref >60)
GFR calc non Af Amer: >60 mL/min (ref >60)
Glucose, Bld: 106 mg/dL — ABNORMAL HIGH (ref 70–99)
Potassium: 4 mmol/L (ref 3.5–5.1)
Sodium: 138 mmol/L (ref 135–145)
Total Bilirubin: 7.9 mg/dL — ABNORMAL HIGH (ref 0.3–1.2)
Total Protein: 4.3 g/dL — ABNORMAL LOW (ref 6.5–8.1)

## 2019-12-08 MED ORDER — MORPHINE SULFATE (PF) 4 MG/ML IV SOLN
2.0000 mg | INTRAVENOUS | Status: DC | PRN
  Administered 2019-12-08: 2 mg via INTRAVENOUS
  Filled 2019-12-08: qty 1

## 2019-12-08 MED ORDER — HYDROMORPHONE HCL 1 MG/ML IJ SOLN
0.5000 mg | INTRAMUSCULAR | Status: DC | PRN
  Administered 2019-12-09 – 2019-12-10 (×3): 0.5 mg via INTRAVENOUS
  Filled 2019-12-08 (×3): qty 1

## 2019-12-08 NOTE — Progress Notes (Signed)
Occupational Therapy Treatment Patient Details Name: Shirley Sanders MRN: [id number] DOB: April 15, 1935 Today's Date: 12/08/2019    History of present illness 85 year old female with history of CVA, type 2diabetes mellitus, hypertension, hyperlipidemia who presented to the emergency department for the evaluation of back pain and jaundice. CT abd/pelvis w/o contrast was notable for multiple poorly defined hepatic lesions concerning for metastatic disease.   OT comments  Pt in bed upon arrival with he daughter present. Pt agreeable to therapy. Session focused on sitting EOB, seated self care, sit- stand form EOB to SPT to [facility] and sit - stand transitions for BSC back to bed. Pt demos increasing weakness this session. OT will continue to follow acutely  Follow Up Recommendations  SNF    Equipment Recommendations  Other (comment)(TBD at next venue of care)    Recommendations for Other Services      Precautions / Restrictions Precautions Precautions: Fall Restrictions Weight Bearing Restrictions: No       Mobility Bed Mobility Overal bed mobility: Needs Assistance Bed Mobility: Supine to Sit;Sit to Supine     Supine to sit: Min assist Sit to supine: Min assist   General bed mobility comments: increased time and effort, used rail  Transfers Overall transfer level: Needs assistance Equipment used: 1 person hand held assist Transfers: Sit to/from [facility] Sit to Stand: Mod assist Stand pivot transfers: Mod assist       General transfer comment: mod to power up and control L side weakness    Balance Overall balance assessment: Needs assistance Sitting-balance support: Feet supported;Bilateral upper extremity supported Sitting balance-Leahy Scale: Fair     Standing balance support: Bilateral upper extremity supported;During functional activity Standing balance-Leahy Scale: Poor                             ADL either performed or assessed with  clinical judgement   ADL Overall ADL's : Needs assistance/impaired     Grooming: Wash/dry face;Wash/dry hands;Minimal assistance;Standing       Lower Body Bathing: Moderate assistance;Sitting/lateral leans           Toilet Transfer: Minimal assistance;Stand-pivot;BSC   Toileting- Clothing Manipulation and Hygiene: Moderate assistance;Sit to/from stand       Functional mobility during ADLs: Moderate assistance       Vision Baseline Vision/History: Wears glasses;Macular Degeneration Patient Visual Report: No change from baseline     Perception     Praxis      Cognition Arousal/Alertness: Awake/alert Behavior During Therapy: Flat affect Overall Cognitive Status: Within Functional Limits for tasks assessed                                          Exercises     Shoulder Instructions       General Comments      Pertinent Vitals/ Pain       Pain Assessment: No/denies pain Pain Score: 0-No pain Faces Pain Scale: No hurt Pain Intervention(s): Monitored during session;Repositioned  Home Living                                          Prior Functioning/Environment              Frequency  Min 2X/week  Progress Toward Goals  OT Goals(current goals can now be found in the care plan section)  Progress towards OT goals: OT to reassess next treatment(pt demos increasing weakness, decreasing activity tolerance)     Plan Discharge plan remains appropriate    Co-evaluation                 AM-PAC OT "6 Clicks" Daily Activity     Outcome Measure   Help from another person eating meals?: None Help from another person taking care of personal grooming?: A Little Help from another person toileting, which includes using toliet, bedpan, or urinal?: A Little Help from another person bathing (including washing, rinsing, drying)?: A Lot Help from another person to put on and taking off regular upper body  clothing?: A Little Help from another person to put on and taking off regular lower body clothing?: Total 6 Click Score: 16    End of Session Equipment Utilized During Treatment: Gait belt;Other (comment)(BSC)  OT Visit Diagnosis: Muscle weakness (generalized) (M62.81);Unsteadiness on feet (R26.81)   Activity Tolerance Patient limited by fatigue   Patient Left in bed;with call bell/phone within reach;with family/visitor present   Nurse Communication          Time: 1410-3013 OT Time Calculation (min): 28 min  Charges: OT General Charges $OT Visit: 1 Visit OT Treatments $Self Care/Home Management : 8-22 mins $Therapeutic Activity: 8-22 mins     Britt Bottom 12/08/2019, 11:57 AM

## 2019-12-08 NOTE — Progress Notes (Signed)
Subjective: Still with abdominal pain.  Objective: Vital signs in last 24 hours: Temp:  [97.6 F (36.4 C)-98.9 F (37.2 C)] 98.6 F (37 C) (03/02 0620) Pulse Rate:  [77-84] 84 (03/02 0620) Resp:  [16-18] 18 (03/02 0620) BP: (109-122)/(59-72) 115/72 (03/02 0620) SpO2:  [94 %-97 %] 97 % (03/02 0620) Last BM Date: 12/05/19  Intake/Output from previous day: 03/01 0701 - 03/02 0700 In: 3407.4 [I.V.:3407.4] Out: 301 [Urine:300; Stool:1] Intake/Output this shift: No intake/output data recorded.  General appearance: alert and mild distress GI: tender in the upper abdomen  Lab Results: Recent Labs    12/06/19 0846 12/07/19 0258 12/08/19 0516  WBC 17.2* 15.6* 14.1*  HGB 10.9* 9.7* 9.4*  HCT 33.5* 31.0* 29.7*  PLT 319 255 266   BMET Recent Labs    12/06/19 0846 12/07/19 0258 12/08/19 0516  NA 137 139 138  K 4.9 3.8 4.0  CL 101 105 105  CO2 25 23 22   GLUCOSE 117* 106* 106*  BUN 27* 19 25*  CREATININE 0.89 0.78 0.76  CALCIUM 7.7* 7.4* 7.5*   LFT Recent Labs    12/08/19 0516  PROT 4.3*  ALBUMIN 1.4*  AST 174*  ALT 115*  ALKPHOS 507*  BILITOT 7.9*   PT/INR No results for input(s): LABPROT, INR in the last 72 hours. Hepatitis Panel No results for input(s): HEPBSAG, HCVAB, HEPAIGM, HEPBIGM in the last 72 hours. C-Diff No results for input(s): CDIFFTOX in the last 72 hours. Fecal Lactopherrin No results for input(s): FECLLACTOFRN in the last 72 hours.  Studies/Results: No results found.  Medications:  Scheduled: . insulin aspart  0-9 Units Subcutaneous TID WC  . lactose free nutrition  237 mL Oral TID WC  . multivitamin with minerals  1 tablet Oral Daily   Continuous: . lactated ringers 150 mL/hr at 12/08/19 0415    Assessment/Plan: 1) Post ERCP pancreatitis. 2) Metastatic disease of unknown origin.   The patient does not feel that her pancreatitis pain is improving, but it is not worsening.  She is controlled when she receives pain medications.   The current plan is for hospice care.  Plan: 1) Consider PCA for more consistent pain control. 2) Maintain NPO at this time.  LOS: 8 days   Emalene Welte D 12/08/2019, 7:14 AM

## 2019-12-08 NOTE — Plan of Care (Signed)
  Problem: Activity: Goal: Risk for activity intolerance will decrease Outcome: Progressing   

## 2019-12-08 NOTE — Plan of Care (Signed)
  Problem: Education: Goal: Knowledge of General Education information will improve Description Including pain rating scale, medication(s)/side effects and non-pharmacologic comfort measures Outcome: Progressing   

## 2019-12-08 NOTE — Care Management (Signed)
Spoke with Dorian Pod with [employer] . DME has been delivered to home.   Magdalen Spatz RN

## 2019-12-08 NOTE — Progress Notes (Signed)
PROGRESS NOTE    Shirley Sanders  OQH:[id number] DOB: [birth year] DOA: 11/29/2019 PCP: Mellody Dance, DO   Brief Narrative:  Patient is 85 year old female with history of CVA, type 2diabetes mellitus, hypertension, hyperlipidemia who presents to the emergency department for the evaluation of back pain and jaundice.  Reported 2 to 3 weeks of ongoing lower back pain with radiation across her left flank to the upper abdomen/epigastric region.  Also reported poor oral intake and stated that her skin has appeared yellow.  Had some nausea.  She has chronic intermittent loose stools and was feeling weak.  On presentation she was hemodynamically stable.  Bilirubin was elevated at 13.3 with elevated liver enzymes, ALP of 372.  Chest x-ray showed unchanged punctuate calcified granuloma of left upper lobe.  CT abdomen/pelvis without contrast showed multiple poorly defined hepatic lesions concerning for metastatic disease to the liver, multiple small nodules throughout the visualized lung bases, mildly enlarged mesorectal lymph node, nonobstructive calculi in the right renal collecting system, moderate size hiatal hernia.  CT lumbar spine showed widespread degenerative lumbar spinal stenosis.  Ultrasound of the abdomen showed intra and extrahepatic biliary dilation, dilated CBD.  Oncology and GI consulted. Underwent liver biopsy on 12/03/2019 by IR.  Underwent  ERCP with  Stent placement on on 12/04/19  by GI. PT/OT evaluated her and recommended skilled nursing facility.  Palliative  care also consulted and recommended hospice at home. She developed severe abdominal pain on 12/05/2019.  Lipase elevated, CT abdomen/pelvis showed uncomplicated acute pancreatitis.  Currently on conservative management for pancreatitis.  Plan is to discharge to home with hospice when her abdominal pain is better and she tolerates food.  Assessment & Plan:   Active Problems:   Hypertension associated with diabetes ([place])   Diabetes  mellitus, type 2 ([place])   History of CVA with residual deficit   Hyperlipidemia associated with type 2 diabetes mellitus ([place])   Metastatic disease ([place])   Hyperbilirubinemia   Jaundice   Protein-calorie malnutrition, severe   Palliative care by specialist   Goals of care, counseling/discussion   DNR (do not resuscitate)   Biliary obstruction   Elevated liver enzymes   Epigastric pain   Leukocytosis   Metastatic adenocarcinoma ([place])   Acute pancreatitis   Hyperbilirubinemia/metastatic liver lesions:  Suspected metastatic disease of unknown primary.  CT imaging as above.  Oncology was following.  Ultrasound of the abdomen as above. Underwent liver biopsy on 12/03/19.  CT chest with contrast for staging showed numerous subcentimeter bilateral pulmonary nodules. Underwent liver biopsy on 12/03/2019 by IR.  Underwent  ERCP with Stent placement in CBD on 12/04/19  by GI. We had a long conversation with daughter .  Given patient's advanced age, dementia, daughter is not very interested to pursue further aggressive treatment for her liver cancer.  Palliative care consulted.  Daughter agreeable with home with hospice  Acute post ERCP pancreatitis:Very unfortunate incident.She developed severe abdominal pain on 12/05/2019 ,the next day after ERCP.  Lipase was elevated, CT abdomen/pelvis showed uncomplicated acute pancreatitis.  Currently on conservative management for post ERCP acute pancreatitis. She also has some  leucocytosis.  No indication for antibiotic therapy for now.  She still complains of abdominal pain.  Pain medication changed and started on Dilaudid  Type 2 diabetes mellitus: On Actos at home.  Continue sliding scale insulin here.  Monitor CBGs  History of CVA: Continue statin.  Aspirin on hold for potential biopsy.  She has mild left residual hemiparesis.  She is  ambulatory on baseline without  a walker or cane.  She lives by herself.PT/OT recommended skilled nursing facility but the  plan is to send her to home with hospice  Hypertension: Currently normotensive.  Home chlorthalidone and lisinopril on hold  Hyperlipidemia: On statin .  We will hold because of elevated liver enzymes, hyperbilirubinemia.  Right thyroid nodule: CT scan showed indeterminate right thyroid nodule of 1.8 cm .  Normal TSH.  Ultrasound of the thyroid showed hypoechoic 2.9 cm solid right  nodule with microcalcification in the right lower lobe.  Discussed with the daughter, and she does not want to pursue further investigation.  Debility/deconditioning: PT/OT consulted and recommended skilled nursing facility.  She is confused on and off.  Continue supportive care. She lives alone.  Daughter will be with patient at her home.  Home with hospice will be arranged    Nutrition Problem: Severe Malnutrition Etiology: acute illness(newly diagnosed metastatic disease)      DVT prophylaxis:SCD Code Status: DNR Family Communication: Discussed with daughter at bedside on 12/05/2019 .  Called daughter on phone, call not received.  Will attempt again later today. disposition Plan: Patient is from home.  She is not stable for discharge today because she is being treated for post ERCP acute pancreatitis. She still complains of abdomen pain today.  Consultants: GI, oncology  Procedures: None  Antimicrobials:  Anti-infectives (From admission, onward)   None      Subjective:  Patient seen and examined at the bedside this morning.  Hemodynamically stable but she was still having  abdominal pain.  No nausea or vomiting.  objective: Vitals:   12/07/19 0934 12/07/19 1658 12/07/19 2035 12/08/19 0620  BP: 122/63 (!) 109/59 111/68 115/72  Pulse: 79 80 77 84  Resp: 16 18 18 18   Temp: 97.6 F (36.4 C) 98.9 F (37.2 C) 98.6 F (37 C) 98.6 F (37 C)  TempSrc: Oral Oral  Axillary  SpO2: 96% 95% 94% 97%  Weight:      Height:        Intake/Output Summary (Last 24 hours) at 12/08/2019 0848 Last data  filed at 12/08/2019 0716 Gross per 24 hour  Intake 3597.4 ml  Output 351 ml  Net 3246.4 ml   Filed Weights   12/02/19 2025 12/05/19 2120 12/06/19 2110  Weight: 45.5 kg 45.6 kg 45.6 kg    Examination:  General exam: Deconditioned, debilitated, pleasantly  confused Respiratory system: Bilateral equal air entry, normal vesicular breath sounds, no wheezes or crackles  Cardiovascular system: S1 & S2 heard, RRR. No JVD, murmurs, rubs, gallops or clicks. Gastrointestinal system: Abdomen is nondistended, soft and tender mainly in the epigastric region. normal bowel sounds heard. Central nervous system: Alert and awake but not oriented  extremities: No edema, no clubbing ,no cyanosis Skin: No rashes, lesions or ulcers,Icterus present   Data Reviewed: I have personally reviewed following labs and imaging studies  CBC: Recent Labs  Lab 12/05/19 1322 12/06/19 0846 12/07/19 0258 12/08/19 0516  WBC 15.6* 17.2* 15.6* 14.1*  NEUTROABS 14.7* 16.9* 14.1* 12.8*  HGB 10.8* 10.9* 9.7* 9.4*  HCT 33.7* 33.5* 31.0* 29.7*  MCV 81.2 79.6* 82.2 80.9  PLT 317 319 255 549   Basic Metabolic Panel: Recent Labs  Lab 12/04/19 0548 12/05/19 1322 12/06/19 0846 12/07/19 0258 12/08/19 0516  NA 137 138 137 139 138  K 3.6 3.8 4.9 3.8 4.0  CL 99 97* 101 105 105  CO2 26 27 25 23 22   GLUCOSE 102* 137* 117* 106*  106*  BUN 18 29* 27* 19 25*  CREATININE 0.85 1.04* 0.89 0.78 0.76  CALCIUM 8.3* 8.3* 7.7* 7.4* 7.5*   GFR: Estimated Creatinine Clearance: 38.9 mL/min (by C-G formula based on SCr of 0.76 mg/dL). Liver Function Tests: Recent Labs  Lab 12/04/19 0548 12/05/19 1322 12/06/19 0846 12/07/19 0258 12/08/19 0516  AST 96* 215* 172* 117* 174*  ALT 69* 128* 124* 102* 115*  ALKPHOS 303* 478* 436* 394* 507*  BILITOT 11.3* 12.3* 10.2* 7.7* 7.9*  PROT 5.1* 5.4* 5.1* 4.6* 4.3*  ALBUMIN 1.9* 2.0* 1.9* 1.5* 1.4*   Recent Labs  Lab 12/05/19 1322  LIPASE 1,111*   No results for input(s): AMMONIA  in the last 168 hours. Coagulation Profile: Recent Labs  Lab 12/01/19 1016 12/02/19 0727 12/03/19 0434  INR 1.6* 1.7* 1.6*   Cardiac Enzymes: No results for input(s): CKTOTAL, CKMB, CKMBINDEX, TROPONINI in the last 168 hours. BNP (last 3 results) No results for input(s): PROBNP in the last 8760 hours. HbA1C: No results for input(s): HGBA1C in the last 72 hours. CBG: Recent Labs  Lab 12/07/19 0727 12/07/19 1136 12/07/19 1659 12/07/19 2231 12/08/19 0719  GLUCAP 103* 100* 101* 95 98   Lipid Profile: No results for input(s): CHOL, HDL, LDLCALC, TRIG, CHOLHDL, LDLDIRECT in the last 72 hours. Thyroid Function Tests: No results for input(s): TSH, T4TOTAL, FREET4, T3FREE, THYROIDAB in the last 72 hours. Anemia Panel: No results for input(s): VITAMINB12, FOLATE, FERRITIN, TIBC, IRON, RETICCTPCT in the last 72 hours. Sepsis Labs: No results for input(s): PROCALCITON, LATICACIDVEN in the last 168 hours.  Recent Results (from the past 240 hour(s))  SARS CORONAVIRUS 2 (TAT 6-24 HRS) Nasopharyngeal Nasopharyngeal Swab     Status: None   Collection Time: 11/29/19  7:52 PM   Specimen: Nasopharyngeal Swab  Result Value Ref Range Status   SARS Coronavirus 2 NEGATIVE NEGATIVE Final    Comment: (NOTE) SARS-CoV-2 target nucleic acids are NOT DETECTED. The SARS-CoV-2 RNA is generally detectable in upper and lower respiratory specimens during the acute phase of infection. Negative results do not preclude SARS-CoV-2 infection, do not rule out co-infections with other pathogens, and should not be used as the sole basis for treatment or other patient management decisions. Negative results must be combined with clinical observations, patient history, and epidemiological information. The expected result is Negative. Fact Sheet for Patients: SugarRoll.be Fact Sheet for Healthcare Providers: https://www.woods-mathews.com/ This test is not yet approved  or cleared by the [place] FDA and  has been authorized for detection and/or diagnosis of SARS-CoV-2 by FDA under an Emergency Use Authorization (EUA). This EUA will remain  in effect (meaning this test can be used) for the duration of the COVID-19 declaration under Section 56 4(b)(1) of the Act, 21 U.S.C. section 360bbb-3(b)(1), unless the authorization is terminated or revoked sooner. Performed at [facility], [place] [address], [place], [place] [address]          Radiology Studies: No results found.      Scheduled Meds: . insulin aspart  0-9 Units Subcutaneous TID WC  . lactose free nutrition  237 mL Oral TID WC  . multivitamin with minerals  1 tablet Oral Daily   Continuous Infusions: . lactated ringers 150 mL/hr at 12/08/19 0716     LOS: 8 days    Time spent: 35 mins.More than 50% of that time was spent in counseling and/or coordination of care.      Shelly Coss, MD [facility] P3/11/2019, 8:48 AM

## 2019-12-09 DIAGNOSIS — E041 Nontoxic single thyroid nodule: Secondary | ICD-10-CM

## 2019-12-09 DIAGNOSIS — E785 Hyperlipidemia, unspecified: Secondary | ICD-10-CM

## 2019-12-09 DIAGNOSIS — E1121 Type 2 diabetes mellitus with diabetic nephropathy: Secondary | ICD-10-CM

## 2019-12-09 DIAGNOSIS — E1159 Type 2 diabetes mellitus with other circulatory complications: Secondary | ICD-10-CM

## 2019-12-09 DIAGNOSIS — E1169 Type 2 diabetes mellitus with other specified complication: Secondary | ICD-10-CM

## 2019-12-09 DIAGNOSIS — E43 Unspecified severe protein-calorie malnutrition: Secondary | ICD-10-CM

## 2019-12-09 DIAGNOSIS — K859 Acute pancreatitis without necrosis or infection, unspecified: Secondary | ICD-10-CM

## 2019-12-09 DIAGNOSIS — I10 Essential (primary) hypertension: Secondary | ICD-10-CM

## 2019-12-09 DIAGNOSIS — C799 Secondary malignant neoplasm of unspecified site: Secondary | ICD-10-CM

## 2019-12-09 DIAGNOSIS — I6930 Unspecified sequelae of cerebral infarction: Secondary | ICD-10-CM

## 2019-12-09 LAB — GLUCOSE, CAPILLARY
Glucose-Capillary: 102 mg/dL — ABNORMAL HIGH (ref 70–99)
Glucose-Capillary: 105 mg/dL — ABNORMAL HIGH (ref 70–99)
Glucose-Capillary: 106 mg/dL — ABNORMAL HIGH (ref 70–99)
Glucose-Capillary: 96 mg/dL (ref 70–99)
Glucose-Capillary: 97 mg/dL (ref 70–99)
Glucose-Capillary: 97 mg/dL (ref 70–99)

## 2019-12-09 LAB — CBC WITH DIFFERENTIAL/PLATELET
Abs Immature Granulocytes: 0.11 10*3/uL — ABNORMAL HIGH (ref 0.00–0.07)
Basophils Absolute: 0 10*3/uL (ref 0.0–0.1)
Basophils Relative: 0 %
Eosinophils Absolute: 0 10*3/uL (ref 0.0–0.5)
Eosinophils Relative: 0 %
HCT: 33.2 % — ABNORMAL LOW (ref 36.0–46.0)
Hemoglobin: 10.6 g/dL — ABNORMAL LOW (ref 12.0–15.0)
Immature Granulocytes: 1 %
Lymphocytes Relative: 3 %
Lymphs Abs: 0.4 10*3/uL — ABNORMAL LOW (ref 0.7–4.0)
MCH: 25.9 pg — ABNORMAL LOW (ref 26.0–34.0)
MCHC: 31.9 g/dL (ref 30.0–36.0)
MCV: 81.2 fL (ref 80.0–100.0)
Monocytes Absolute: 0.6 10*3/uL (ref 0.1–1.0)
Monocytes Relative: 5 %
Neutro Abs: 10.8 10*3/uL — ABNORMAL HIGH (ref 1.7–7.7)
Neutrophils Relative %: 91 %
Platelets: 265 10*3/uL (ref 150–400)
RBC: 4.09 MIL/uL (ref 3.87–5.11)
RDW: 24.4 % — ABNORMAL HIGH (ref 11.5–15.5)
WBC: 11.9 10*3/uL — ABNORMAL HIGH (ref 4.0–10.5)
nRBC: 0.2 % (ref 0.0–0.2)

## 2019-12-09 LAB — COMPREHENSIVE METABOLIC PANEL
ALT: 144 U/L — ABNORMAL HIGH (ref 0–44)
AST: 216 U/L — ABNORMAL HIGH (ref 15–41)
Albumin: 1.4 g/dL — ABNORMAL LOW (ref 3.5–5.0)
Alkaline Phosphatase: 663 U/L — ABNORMAL HIGH (ref 38–126)
Anion gap: 12 (ref 5–15)
BUN: 26 mg/dL — ABNORMAL HIGH (ref 8–23)
CO2: 24 mmol/L (ref 22–32)
Calcium: 7.8 mg/dL — ABNORMAL LOW (ref 8.9–10.3)
Chloride: 103 mmol/L (ref 98–111)
Creatinine, Ser: 0.83 mg/dL (ref 0.44–1.00)
GFR calc Af Amer: >60 mL/min (ref >60)
GFR calc non Af Amer: >60 mL/min (ref >60)
Glucose, Bld: 113 mg/dL — ABNORMAL HIGH (ref 70–99)
Potassium: 4.4 mmol/L (ref 3.5–5.1)
Sodium: 139 mmol/L (ref 135–145)
Total Bilirubin: 7 mg/dL — ABNORMAL HIGH (ref 0.3–1.2)
Total Protein: 4.6 g/dL — ABNORMAL LOW (ref 6.5–8.1)

## 2019-12-09 LAB — LIPASE, BLOOD: Lipase: 17 U/L (ref 11–51)

## 2019-12-09 NOTE — Plan of Care (Signed)
  Problem: Education: Goal: Knowledge of General Education information will improve Description Including pain rating scale, medication(s)/side effects and non-pharmacologic comfort measures Outcome: Progressing   

## 2019-12-09 NOTE — Progress Notes (Signed)
PROGRESS NOTE    CATHEY FREDENBURG  GUY:[id number] DOB: 1935/07/04 DOA: 11/29/2019 PCP: Mellody Dance, DO    Brief Narrative:  Patient is 85 year old female with history of CVA, type 2diabetes mellitus, hypertension, hyperlipidemia who presents to the emergency department for the evaluation of back pain and jaundice.  Reported 2 to 3 weeks of ongoing lower back pain with radiation across her left flank to the upper abdomen/epigastric region.  Also reported poor oral intake and stated that her skin has appeared yellow.  Had some nausea.  She has chronic intermittent loose stools and was feeling weak.  On presentation she was hemodynamically stable.  Bilirubin was elevated at 13.3 with elevated liver enzymes, ALP of 372.  Chest x-ray showed unchanged punctuate calcified granuloma of left upper lobe.  CT abdomen/pelvis without contrast showed multiple poorly defined hepatic lesions concerning for metastatic disease to the liver, multiple small nodules throughout the visualized lung bases, mildly enlarged mesorectal lymph node, nonobstructive calculi in the right renal collecting system, moderate size hiatal hernia.  CT lumbar spine showed widespread degenerative lumbar spinal stenosis.  Ultrasound of the abdomen showed intra and extrahepatic biliary dilation, dilated CBD.  Oncology and GI consulted. Underwent liver biopsy on 12/03/2019 by IR.  Underwent  ERCP with  Stent placement on on 12/04/19  by GI. PT/OT evaluated her and recommended skilled nursing facility.  Palliative  care also consulted and recommended hospice at home. She developed severe abdominal pain on 12/05/2019.  Lipase elevated, CT abdomen/pelvis showed uncomplicated acute pancreatitis.  Currently on conservative management for pancreatitis.  Plan is to discharge to home with hospice when her abdominal pain is better and she tolerates food   Assessment & Plan:   Active Problems:   Hypertension associated with diabetes ([place])   Diabetes  mellitus, type 2 ([place])   History of CVA with residual deficit   Hyperlipidemia associated with type 2 diabetes mellitus ([place])   Metastatic disease ([place])   Hyperbilirubinemia   Jaundice   Protein-calorie malnutrition, severe   Palliative care by specialist   Goals of care, counseling/discussion   DNR (do not resuscitate)   Biliary obstruction   Elevated liver enzymes   Epigastric pain   Leukocytosis   Metastatic adenocarcinoma ([place])   Acute pancreatitis  1 metastatic liver lesion/hyperbilirubinemia Patient with suspected metastatic disease of unknown primary.  Patient status post liver biopsy 12/03/2019 per IR showing adenocarcinoma with necrosis.  Patient status post ERCP with stent placement in CBD, 12/04/2019 per GI.  Bilirubin trending down.  CT chest which was done for staging showed numerous subcentimeter bilateral pulmonary nodules.  Dr. Lenna Sciara had a long conversation with patient's daughter and given patient's advanced age, dementia daughter was not very interested in pursuing further aggressive treatment for her liver cancer.  Palliative care consulted and decision made to go home with hospice once patient tolerating oral intake with improvement with acute pancreatitis.  Continue supportive care.  Follow.  2.  Acute post ERCP pancreatitis Patient developed severe abdominal pain on 12/05/2019 the day after ERCP with stent placement.  Lipase was elevated at 1111.  CT abdomen and pelvis showed uncomplicated acute pancreatitis.  Patient currently n.p.o. with aggressive fluid resuscitation.  Patient still with upper abdominal pain.  Lipase levels have trended down to 17.  Leukocytosis trending down.  Decrease IV fluids to 75 cc/h.  Trial of clear liquids and if tolerates can advance to a full liquid diet.  Continue current pain management.  GI following.  3.  Diabetes mellitus type 2 Noted to be on Actos prior to admission.  Hemoglobin A1c noted to be 6.9 on 10/23/2019.  Patient currently  n.p.o.  Due to problem #1 and #2 we will discontinue sliding scale insulin and will not check CBGs anymore.  Patient likely home with hospice when tolerating oral intake.  4.  History of CVA Discontinued statin as LFTs elevated.  Aspirin was held for biopsy.  Patient ambulatory at baseline without a walker or cane with mild left residual hemiparesis.  PT recommended SNF however family have decided to go home with hospice.  5.  Hypertension Stable.  Continue to hold antihypertensive medications.  6.  Hyperlipidemia Continue to hold statin.  7.  Right thyroid nodule CT scan did show indeterminate right thyroid nodule of 1.8 cm.  TSH within normal limits.  Ultrasound of the thyroid showed hypoechoic 2.9 cm solid right nodule with microcalcification in the right lower lobe.  Daughter does not want to pursue any further evaluation.  8.  Debility/deconditioning Likely secondary to problem #1.  Patient was seen by PT/OT who recommended SNF.  Palliative care assessed patient and decision was made to go home with hospice.  9.  Severe protein calorie malnutrition Secondary to problem #1.  Patient likely home with hospice when tolerating oral intake.   DVT prophylaxis: SCDs Code Status: DNR Family Communication: Updated patient.  Updated daughter at bedside. Disposition Plan:  . Patient came from: Home            . Anticipated d/c place: Home with hospice . Barriers to d/c OR conditions which need to be met to effect a safe d/c: Home with hospice once tolerating oral intake with resolution of acute pancreatitis.   Consultants:   Palliative care: Tacey Ruiz, NP 12/04/2019  Interventional radiology: Dr. Kathlene Cote 12/01/2019  Gastroenterology: Dr. Collene Mares 11/30/2019  Oncology: Dr. Lorenso Courier 12/01/2019  Procedures:   CT abdomen and pelvis 11/29/2019  CT chest with contrast 12/01/2019  CT abdomen and pelvis 12/05/2019  Abdominal ultrasound 11/30/2019  Ultrasound-guided liver biopsy per Dr.  Earleen Newport, [place] 12/03/2019  ERCP with stent placement 12/04/2019 per GI, Dr. Benson Norway  Antimicrobials:   None   Subjective: Patient laying in bed.  Still with upper abdominal pain however improving.  No nausea or emesis.  Passing gas and having bowel movement per patient and daughter at bedside.  Patient denies any shortness of breath.  No chest pain.  Objective: Vitals:   12/08/19 1610 12/08/19 2038 12/09/19 0518 12/09/19 0916  BP: 116/65 122/72 121/70 115/64  Pulse: 79 77 89 72  Resp: 18 17 16 18   Temp: 97.7 F (36.5 C) 98.2 F (36.8 C) (!) 97.5 F (36.4 C) 97.8 F (36.6 C)  TempSrc: Oral Oral Oral Oral  SpO2: 93% 94% 95% 96%  Weight:      Height:        Intake/Output Summary (Last 24 hours) at 12/09/2019 1135 Last data filed at 12/09/2019 0900 Gross per 24 hour  Intake 2689.92 ml  Output 0 ml  Net 2689.92 ml   Filed Weights   12/02/19 2025 12/05/19 2120 12/06/19 2110  Weight: 45.5 kg 45.6 kg 45.6 kg    Examination:  General exam: Appears calm and comfortable  Respiratory system: Clear to auscultation anterior lung fields. Respiratory effort normal. Cardiovascular system: S1 & S2 heard, RRR. No JVD, murmurs, rubs, gallops or clicks.  1-2+ bilateral lower extremity edema.   Gastrointestinal system: Abdomen is nondistended, soft and some tenderness to palpation bilateral upper  quadrants.  No rebound.  No guarding.  Positive bowel sounds. Central nervous system: Alert and oriented. No focal neurological deficits. Extremities: 1-2+ bilateral lower extremity edema.   Skin: No rashes, lesions or ulcers Psychiatry: Judgement and insight appear normal. Mood & affect appropriate.     Data Reviewed: I have personally reviewed following labs and imaging studies  CBC: Recent Labs  Lab 12/05/19 1322 12/06/19 0846 12/07/19 0258 12/08/19 0516 12/09/19 0732  WBC 15.6* 17.2* 15.6* 14.1* 11.9*  NEUTROABS 14.7* 16.9* 14.1* 12.8* 10.8*  HGB 10.8* 10.9* 9.7* 9.4* 10.6*  HCT 33.7*  33.5* 31.0* 29.7* 33.2*  MCV 81.2 79.6* 82.2 80.9 81.2  PLT 317 319 255 266 111   Basic Metabolic Panel: Recent Labs  Lab 12/05/19 1322 12/06/19 0846 12/07/19 0258 12/08/19 0516 12/09/19 0732  NA 138 137 139 138 139  K 3.8 4.9 3.8 4.0 4.4  CL 97* 101 105 105 103  CO2 27 25 23 22 24   GLUCOSE 137* 117* 106* 106* 113*  BUN 29* 27* 19 25* 26*  CREATININE 1.04* 0.89 0.78 0.76 0.83  CALCIUM 8.3* 7.7* 7.4* 7.5* 7.8*   GFR: Estimated Creatinine Clearance: 37.5 mL/min (by C-G formula based on SCr of 0.83 mg/dL). Liver Function Tests: Recent Labs  Lab 12/05/19 1322 12/06/19 0846 12/07/19 0258 12/08/19 0516 12/09/19 0732  AST 215* 172* 117* 174* 216*  ALT 128* 124* 102* 115* 144*  ALKPHOS 478* 436* 394* 507* 663*  BILITOT 12.3* 10.2* 7.7* 7.9* 7.0*  PROT 5.4* 5.1* 4.6* 4.3* 4.6*  ALBUMIN 2.0* 1.9* 1.5* 1.4* 1.4*   Recent Labs  Lab 12/05/19 1322 12/09/19 1000  LIPASE 1,111* 17   No results for input(s): AMMONIA in the last 168 hours. Coagulation Profile: Recent Labs  Lab 12/03/19 0434  INR 1.6*   Cardiac Enzymes: No results for input(s): CKTOTAL, CKMB, CKMBINDEX, TROPONINI in the last 168 hours. BNP (last 3 results) No results for input(s): PROBNP in the last 8760 hours. HbA1C: No results for input(s): HGBA1C in the last 72 hours. CBG: Recent Labs  Lab 12/08/19 2036 12/09/19 0014 12/09/19 0517 12/09/19 0726 12/09/19 1109  GLUCAP 115* 105* 97 96 97   Lipid Profile: No results for input(s): CHOL, HDL, LDLCALC, TRIG, CHOLHDL, LDLDIRECT in the last 72 hours. Thyroid Function Tests: No results for input(s): TSH, T4TOTAL, FREET4, T3FREE, THYROIDAB in the last 72 hours. Anemia Panel: No results for input(s): VITAMINB12, FOLATE, FERRITIN, TIBC, IRON, RETICCTPCT in the last 72 hours. Sepsis Labs: No results for input(s): PROCALCITON, LATICACIDVEN in the last 168 hours.  Recent Results (from the past 240 hour(s))  SARS CORONAVIRUS 2 (TAT 6-24 HRS) Nasopharyngeal  Nasopharyngeal Swab     Status: None   Collection Time: 11/29/19  7:52 PM   Specimen: Nasopharyngeal Swab  Result Value Ref Range Status   SARS Coronavirus 2 NEGATIVE NEGATIVE Final    Comment: (NOTE) SARS-CoV-2 target nucleic acids are NOT DETECTED. The SARS-CoV-2 RNA is generally detectable in upper and lower respiratory specimens during the acute phase of infection. Negative results do not preclude SARS-CoV-2 infection, do not rule out co-infections with other pathogens, and should not be used as the sole basis for treatment or other patient management decisions. Negative results must be combined with clinical observations, patient history, and epidemiological information. The expected result is Negative. Fact Sheet for Patients: SugarRoll.be Fact Sheet for Healthcare Providers: https://www.woods-mathews.com/ This test is not yet approved or cleared by the [place] FDA and  has been authorized for detection and/or  diagnosis of SARS-CoV-2 by FDA under an Emergency Use Authorization (EUA). This EUA will remain  in effect (meaning this test can be used) for the duration of the COVID-19 declaration under Section 56 4(b)(1) of the Act, 21 U.S.C. section 360bbb-3(b)(1), unless the authorization is terminated or revoked sooner. Performed at [facility], [place] [address], [place], [place] [address]          Radiology Studies: No results found.      Scheduled Meds: . insulin aspart  0-9 Units Subcutaneous TID WC  . lactose free nutrition  237 mL Oral TID WC  . multivitamin with minerals  1 tablet Oral Daily   Continuous Infusions: . lactated ringers 150 mL/hr at 12/09/19 0841     LOS: 9 days    Time spent: 40 minutes    Irine Seal, MD [facility]   To contact the attending provider between 7A-7P or the covering provider during after hours 7P-7A, please log into the web site [email or web address] and access  using universal [place] password for that web site. If you do not have the password, please call the hospital operator.  12/09/2019, 11:35 AM

## 2019-12-09 NOTE — Progress Notes (Signed)
PT Cancellation Note  Patient Details Name: Shirley Sanders MRN: [id number] DOB: 1935-07-28   Cancelled Treatment:    Reason Eval/Treat Not Completed: Fatigue/lethargy limiting ability to participate returned to patient this afternoon, she remains very lethargic and sound asleep, unable to participate in PT. Will return on next date of service at this point.    Windell Norfolk, DPT, PN1   Supplemental Physical Therapist [facility]    Pager [phone] Acute Rehab Office [phone]

## 2019-12-09 NOTE — Progress Notes (Signed)
Subjective: Patient has been having ongoing problems with abdominal pain lipase is 17 responding well to Dilaudid every few hours. Her daughter is at the bedside patient's been sleeping most of the time. She developed post ERCP pancreatitis after stent placement on 12/04/2019. Biopsies of the liver lesion by IR revealed adenocarcinoma with necrosis possibility of GI origin. Patient has been made DNR.  Objective: Vital signs in last 24 hours: Temp:  [97.5 F (36.4 C)-98.2 F (36.8 C)] 97.8 F (36.6 C) (03/03 0916) Pulse Rate:  [72-89] 72 (03/03 0916) Resp:  [16-18] 18 (03/03 0916) BP: (115-122)/(64-72) 115/64 (03/03 0916) SpO2:  [93 %-96 %] 96 % (03/03 0916) Last BM Date: 12/08/19  Intake/Output from previous day: 03/02 0701 - 03/03 0700 In: 3403.5 [I.V.:3403.5] Out: 50 [Urine:50] Intake/Output this shift: No intake/output data recorded.  General appearance: fatigued, icteric and no distress Resp: clear to auscultation bilaterally Cardio: regular rate and rhythm, S1, S2 normal, no murmur, click, rub or gallop GI: soft, non-tender; bowel sounds normal; no masses,  no organomegaly Extremities: extremities normal, atraumatic, no cyanosis or edema  Lab Results: Recent Labs    12/07/19 0258 12/08/19 0516 12/09/19 0732  WBC 15.6* 14.1* 11.9*  HGB 9.7* 9.4* 10.6*  HCT 31.0* 29.7* 33.2*  PLT 255 266 265   BMET Recent Labs    12/07/19 0258 12/08/19 0516 12/09/19 0732  NA 139 138 139  K 3.8 4.0 4.4  CL 105 105 103  CO2 23 22 24   GLUCOSE 106* 106* 113*  BUN 19 25* 26*  CREATININE 0.78 0.76 0.83  CALCIUM 7.4* 7.5* 7.8*   LFT Recent Labs    12/09/19 0732  PROT 4.6*  ALBUMIN 1.4*  AST 216*  ALT 144*  ALKPHOS 663*  BILITOT 7.0*   Studies/Results: No results found.  Medications: I have reviewed the patient's current medications.  Assessment/Plan: Metastatic carcinoma with hyperbilirubinemia with metastatic liver lesions ?GI primary/post ERCP--patient is on  Dilaudid for comfort care. Apparently she will be going home with hospice.  We will sign off now.    LOS: 9 days   Juanita Craver 12/09/2019, 12:58 PM

## 2019-12-09 NOTE — Plan of Care (Signed)
  Problem: Activity: Goal: Risk for activity intolerance will decrease Outcome: Progressing   

## 2019-12-09 NOTE — Progress Notes (Signed)
PT Cancellation Note  Patient Details Name: Shirley Sanders MRN: [id number] DOB: [birth year]   Cancelled Treatment:    Reason Eval/Treat Not Completed: Fatigue/lethargy limiting ability to participate attempted to see patient, she was sleeping heavily and daughter reports she just got pain meds at about 12:30. Will attempt to check back later if time/schedule allow.    Windell Norfolk, DPT, PN1   Supplemental Physical Therapist [facility]    Pager [phone] Acute Rehab Office [phone]

## 2019-12-10 DIAGNOSIS — C787 Secondary malignant neoplasm of liver and intrahepatic bile duct: Principal | ICD-10-CM

## 2019-12-10 DIAGNOSIS — Z515 Encounter for palliative care: Secondary | ICD-10-CM

## 2019-12-10 LAB — COMPREHENSIVE METABOLIC PANEL
ALT: 160 U/L — ABNORMAL HIGH (ref 0–44)
AST: 245 U/L — ABNORMAL HIGH (ref 15–41)
Albumin: 1.2 g/dL — ABNORMAL LOW (ref 3.5–5.0)
Alkaline Phosphatase: 669 U/L — ABNORMAL HIGH (ref 38–126)
Anion gap: 13 (ref 5–15)
BUN: 27 mg/dL — ABNORMAL HIGH (ref 8–23)
CO2: 22 mmol/L (ref 22–32)
Calcium: 7.6 mg/dL — ABNORMAL LOW (ref 8.9–10.3)
Chloride: 106 mmol/L (ref 98–111)
Creatinine, Ser: 0.91 mg/dL (ref 0.44–1.00)
GFR calc Af Amer: >60 mL/min (ref >60)
GFR calc non Af Amer: 59 mL/min — ABNORMAL LOW (ref >60)
Glucose, Bld: 111 mg/dL — ABNORMAL HIGH (ref 70–99)
Potassium: 5 mmol/L (ref 3.5–5.1)
Sodium: 141 mmol/L (ref 135–145)
Total Bilirubin: 6 mg/dL — ABNORMAL HIGH (ref 0.3–1.2)
Total Protein: 4.2 g/dL — ABNORMAL LOW (ref 6.5–8.1)

## 2019-12-10 LAB — GLUCOSE, CAPILLARY: Glucose-Capillary: 102 mg/dL — ABNORMAL HIGH (ref 70–99)

## 2019-12-10 LAB — MAGNESIUM: Magnesium: 2 mg/dL (ref 1.7–2.4)

## 2019-12-10 MED ORDER — POLYVINYL ALCOHOL 1.4 % OP SOLN
1.0000 [drp] | Freq: Four times a day (QID) | OPHTHALMIC | Status: DC | PRN
  Filled 2019-12-10: qty 15

## 2019-12-10 MED ORDER — GLYCOPYRROLATE 1 MG PO TABS: 1.0000 mg | ORAL_TABLET | ORAL | Status: DC | PRN

## 2019-12-10 MED ORDER — GLYCOPYRROLATE 0.2 MG/ML IJ SOLN: 0.2000 mg | INTRAMUSCULAR | Status: DC | PRN

## 2019-12-10 MED ORDER — MORPHINE SULFATE (CONCENTRATE) 10 MG/0.5ML PO SOLN
10.0000 mg | ORAL | Status: DC | PRN
  Administered 2019-12-10 – 2019-12-11 (×2): 10 mg via SUBLINGUAL
  Filled 2019-12-10: qty 0.5

## 2019-12-10 MED ORDER — MORPHINE SULFATE (CONCENTRATE) 10 MG/0.5ML PO SOLN
10.0000 mg | Freq: Three times a day (TID) | ORAL | Status: DC
  Administered 2019-12-10 – 2019-12-11 (×3): 10 mg via ORAL
  Filled 2019-12-10 (×3): qty 0.5

## 2019-12-10 MED ORDER — HYDROMORPHONE HCL 1 MG/ML IJ SOLN
0.5000 mg | INTRAMUSCULAR | Status: DC | PRN
  Administered 2019-12-10 – 2019-12-11 (×2): 0.5 mg via INTRAVENOUS
  Filled 2019-12-10 (×2): qty 1

## 2019-12-10 MED ORDER — MORPHINE SULFATE (CONCENTRATE) 10 MG/0.5ML PO SOLN: 10.0000 mg | Freq: Four times a day (QID) | ORAL | Status: DC

## 2019-12-10 MED ORDER — MORPHINE SULFATE 15 MG PO TABS: 15.0000 mg | ORAL_TABLET | ORAL | Status: DC | PRN

## 2019-12-10 MED ORDER — FUROSEMIDE 10 MG/ML IJ SOLN
20.0000 mg | Freq: Once | INTRAMUSCULAR | Status: AC
  Administered 2019-12-10: 20 mg via INTRAVENOUS
  Filled 2019-12-10: qty 2

## 2019-12-10 MED ORDER — LORAZEPAM 2 MG/ML PO CONC: 0.5000 mg | ORAL | Status: DC | PRN

## 2019-12-10 NOTE — Progress Notes (Addendum)
Daily Progress Note   Patient Name: Shirley Sanders       Date: 12/10/2019 DOB: [birth year]  Age: 85 y.o. MRN#: [id number] Attending Physician: Eugenie Filler, MD Primary Care Physician: Mellody Dance, DO Admit Date: 11/29/2019  Reason for Consultation/Follow-up: Establishing goals of care, Pain control and Psychosocial/spiritual support  Subjective: Spoke with patient and daughter at bedside.  Maudie Mercury tells me that she and her mother (patient) both understand she is nearing end of life.  I shared with Maudie Mercury that her mother's eating / drinking is not likely to improve and that clears are probably the easiest thing for her to handle.  Maudie Mercury stated she was afraid of caring for her mother at home.  We discussed the option of an inpatient hospice unit.  Then Maudie Mercury sat up and stated that she really wanted to try to care for her mother at home. We discussed her current symptoms and how to best manage them.  Assessment: Patient with metastatic cancer of unknown origin who developed post ERCP pancreatitis.  Still with minimal PO intake.  Requiring IV dilaudid for pain control.   Patient Profile/HPI:  85 yo female presented with biliary obstruction.  She underwent ERCP with stent placement.  She unfortunately developed post ERCP pancreatitis.  Pathology indicated adenocarcinoma with necrosis.      Length of Stay: 10  Current Medications: Scheduled Meds:  . furosemide  20 mg Intravenous Once  . lactose free nutrition  237 mL Oral TID WC  . morphine CONCENTRATE  10 mg Oral Q8H    Continuous Infusions:   PRN Meds: glycopyrrolate **OR** glycopyrrolate **OR** glycopyrrolate, HYDROmorphone (DILAUDID) injection, LORazepam, morphine CONCENTRATE, ondansetron (ZOFRAN) IV, ondansetron **OR** [DISCONTINUED]  ondansetron (ZOFRAN) IV, polyvinyl alcohol  Physical Exam        Elderly chronically ill appearing female, + Pallor, lethargic but pleasant.  Uncomfortable in bed. CV rrr resp no distress Abdomen soft, non-distended. Ext with 3+ edema.  Vital Signs: BP 107/63 (BP Location: Left Arm)   Pulse (!) 105   Temp 99.6 F (37.6 C) (Oral)   Resp 14   Ht 5' (1.524 m)   Wt 45.6 kg   SpO2 92%   BMI 19.64 kg/m  SpO2: SpO2: 92 % O2 Device: O2 Device: Room Air O2 Flow Rate: O2 Flow Rate (L/min): 6 L/min  Intake/output summary:   Intake/Output Summary (Last 24 hours) at 12/10/2019 1231 Last data filed at 12/10/2019 0900 Gross per 24 hour  Intake 1020.1 ml  Output 0 ml  Net 1020.1 ml   LBM: Last BM Date: 12/10/19 Baseline Weight: Weight: 44.6 kg Most recent weight: Weight: 45.6 kg       Palliative Assessment/Data: 20%      Patient Active Problem List   Diagnosis Date Noted  . Thyroid nodule   . Acute pancreatitis   . Biliary obstruction   . Elevated liver enzymes   . Epigastric pain   . Leukocytosis   . Metastatic adenocarcinoma ([place])   . Palliative care by specialist   . Goals of care, counseling/discussion   . DNR (do not resuscitate)   . Protein-calorie malnutrition, severe 12/02/2019  . Metastatic disease ([place]) 11/29/2019  . Hyperbilirubinemia 11/29/2019  . Jaundice 11/29/2019  . Poor appetite - chronically poor esp since pain assoc with recent accidental fall 11/23/2019  . Injury- h/o Fall 1 mo ago 11/23/2019  . Feared complaint without diagnosis-  ?ably jaundice per family 11/23/2019  . Thoracic back pain 11/16/2019  . Establishing care with new doctor, encounter for 04/08/2019  . Hyperlipidemia associated with type 2 diabetes mellitus ([place]) 04/08/2019  . H/O noncompliance with medical treatment, presenting hazards to health 04/08/2019  . Diabetes mellitus due to underlying condition with stage 3 chronic kidney disease, without long-term current use of insulin ([place])  04/08/2019  . Cerebrovascular accident (CVA) ([place]) 04/08/2019  . Macular degeneration (senile) of retina/  legally blind 04/08/2019  . Family history of stroke or transient ischemic attack in mother died age 83; several other fam members also has CVAs 04/08/2019  . Legally blind 04/08/2019  . Compression fracture of thoracic vertebra (HCC) 09/17/2018  . Hypoglycemia due to type 2 diabetes mellitus ([place]) 01/31/2018  . Chronic fatigue 01/31/2018  . Vertigo 01/31/2018  . Osteoporosis 07/08/2015  . Diabetes mellitus with diabetic nephropathy ([place]) 04/06/2015  . History of CVA with residual deficit 10/22/2014  . Chronic kidney disease (CKD) stage G4/A1, severely decreased glomerular filtration rate (GFR) between 15-29 mL/min/1.73 square meter and albuminuria creatinine ratio less than 30 mg/g (HCC) 10/22/2014  . Macular degeneration of both eyes 10/22/2014  . Anemia in chronic kidney disease 10/22/2014  . Hyperlipidemia 03/17/2014  . Hypertension associated with diabetes ([place]) 03/17/2014  . Diabetes mellitus, type 2 ([place]) 03/17/2014    Palliative Care Plan    Recommendations/Plan:  Will shift to comfort measures.  Stop IVF and all interventions not directed towards comfort.  Will initiate SL morphine concentrate and trial it today to determine if it successfully controls her pain.  Plan for DC 3/5 home with Hospice if pain can be controlled without IV pain medication.  Goals of Care and Additional Recommendations:  Limitations on Scope of Treatment: Full Comfort Care  Code Status:  DNR  Prognosis:   < 2 weeks.  She is eligible for Hospice inpatient facility if the family feels her symptoms are too difficult to control at home.  Daughter planning to take her home with support from Hospice and family.  Discharge Planning:  Home with Hospice  Care plan was discussed with MD, family, RN  Thank you for allowing the Palliative Medicine Team to assist in the care of this  patient.  Total time spent:  35 min.     Greater than 50%  of this time was spent counseling and coordinating care related to the above assessment and  plan.  Florentina Jenny, PA-C Palliative Medicine  Please contact Palliative MedicineTeam phone at [phone] for questions and concerns between 7 am - 7 pm.   Please see AMION for individual provider pager numbers.

## 2019-12-10 NOTE — Care Management (Signed)
Updated Shirley Sanders with [facility] , possible discharge tomorrow. Shirley Sanders will call daughter today to discuss.   Magdalen Spatz RN

## 2019-12-10 NOTE — Progress Notes (Signed)
SLP Cancellation Note  Patient Details Name: Shirley Sanders MRN: [id number] DOB: Jan 20, 1935   Cancelled assessments:  Orders received for speech and swallow assessments - spoke with RN and Palliative Medicine; pt has transitioned to full comfort care - agree she should be permitted to eat/drink the items she is able to and from which she derives some pleasure.  Our service will respectfully sign off.  Jadyn Barge L. Tivis Ringer, [place] Office number [phone] Pager [phone]         Juan Quam Laurice 12/10/2019, 10:49 AM

## 2019-12-10 NOTE — Progress Notes (Signed)
PROGRESS NOTE    Shirley Sanders  MRN:[id number] DOB: 06/29/1935 DOA: 11/29/2019 PCP: Opalski, Deborah, DO    Brief Narrative:  Patient is 85-year-old female with history of CVA, type 2diabetes mellitus, hypertension, hyperlipidemia who presents to the emergency department for the evaluation of back pain and jaundice.  Reported 2 to 3 weeks of ongoing lower back pain with radiation across her left flank to the upper abdomen/epigastric region.  Also reported poor oral intake and stated that her skin has appeared yellow.  Had some nausea.  She has chronic intermittent loose stools and was feeling weak.  On presentation she was hemodynamically stable.  Bilirubin was elevated at 13.3 with elevated liver enzymes, ALP of 372.  Chest x-ray showed unchanged punctuate calcified granuloma of left upper lobe.  CT abdomen/pelvis without contrast showed multiple poorly defined hepatic lesions concerning for metastatic disease to the liver, multiple small nodules throughout the visualized lung bases, mildly enlarged mesorectal lymph node, nonobstructive calculi in the right renal collecting system, moderate size hiatal hernia.  CT lumbar spine showed widespread degenerative lumbar spinal stenosis.  Ultrasound of the abdomen showed intra and extrahepatic biliary dilation, dilated CBD.  Oncology and GI consulted. Underwent liver biopsy on 12/03/2019 by IR.  Underwent  ERCP with  Stent placement on on 12/04/19  by GI. PT/OT evaluated her and recommended skilled nursing facility.  Palliative  care also consulted and recommended hospice at home. She developed severe abdominal pain on 12/05/2019.  Lipase elevated, CT abdomen/pelvis showed uncomplicated acute pancreatitis.  Currently on conservative management for pancreatitis.  Plan is to discharge to home with hospice when her abdominal pain is better and she tolerates food   Assessment & Plan:   Active Problems:   Hypertension associated with diabetes (HCC)   Diabetes  mellitus, type 2 (HCC)   History of CVA with residual deficit   Hyperlipidemia associated with type 2 diabetes mellitus (HCC)   Metastatic disease (HCC)   Hyperbilirubinemia   Jaundice   Protein-calorie malnutrition, severe   Palliative care by specialist   Goals of care, counseling/discussion   DNR (do not resuscitate)   Biliary obstruction   Elevated liver enzymes   Epigastric pain   Leukocytosis   Metastatic adenocarcinoma (HCC)   Acute pancreatitis   Thyroid nodule  1 metastatic liver lesion/hyperbilirubinemia Patient with suspected metastatic disease of unknown primary.  Patient status post liver biopsy 12/03/2019 per IR showing adenocarcinoma with necrosis.  Patient status post ERCP with stent placement in CBD, 12/04/2019 per GI.  Bilirubin trending down.  CT chest which was done for staging showed numerous subcentimeter bilateral pulmonary nodules.  Dr. Adhikari had a long conversation with patient's daughter and given patient's advanced age, dementia daughter was not very interested in pursuing further aggressive treatment for her liver cancer.  Palliative care consulted and decision made to go home with hospice once patient tolerating oral intake with improvement with acute pancreatitis.  Patient started on oral morphine for pain management however daughter requesting sublingual pain medication and as such palliative care changed to oral morphine tablets to sublingual Roxanol and helping to manage patient's pain better.  If pain is managed could likely discharge home with hospice.  2.  Acute post ERCP pancreatitis Patient developed severe abdominal pain on 12/05/2019 the day after ERCP with stent placement.  Lipase was elevated at 1111.  CT abdomen and pelvis showed uncomplicated acute pancreatitis.  Patient currently n.p.o. with aggressive fluid resuscitation.  Patient still with upper abdominal pain.  Lipase   levels have trended down to 17.  Leukocytosis trending down.  Saline lock IV  fluids.  Patient on a full liquid diet and will advance as tolerated to a regular diet.  Continue current pain management.  GI was following.   3.  Diabetes mellitus type 2 Noted to be on Actos prior to admission.  Hemoglobin A1c noted to be 6.9 on 10/23/2019.  Patient currently n.p.o.  Due to problem #1 and #2 sliding scale insulin has been discontinued.  CBGs discontinued.  Patient home with hospice when tolerating oral intake with better pain management.   4.  History of CVA Discontinued statin as LFTs elevated.  Aspirin was held for biopsy.  Patient ambulatory at baseline without a walker or cane with mild left residual hemiparesis.  PT recommended SNF however family have decided to go home with hospice.  5.  Hypertension Continue to hold antihypertensive medications.   6.  Hyperlipidemia Statin on hold.    7.  Right thyroid nodule CT scan did show indeterminate right thyroid nodule of 1.8 cm.  TSH within normal limits.  Ultrasound of the thyroid showed hypoechoic 2.9 cm solid right nodule with microcalcification in the right lower lobe.  Daughter does not want to pursue any further evaluation.  8.  Debility/deconditioning Likely secondary to problem #1.  Patient was seen by PT/OT who recommended SNF.  Palliative care assessed patient and decision was made to go home with hospice.  9.  Severe protein calorie malnutrition Secondary to problem #1.  Patient likely home with hospice when tolerating oral intake.   DVT prophylaxis: SCDs Code Status: DNR Family Communication: Updated patient.  Updated daughter at bedside. Disposition Plan:  . Patient came from: Home            . Anticipated d/c place: Home with hospice . Barriers to d/c OR conditions which need to be met to effect a safe d/c: Home with hospice once tolerating oral intake with resolution of acute pancreatitis with management of abdominal pain hopefully in the next 24 to 48 hours..   Consultants:   Palliative care:  Michelle Ferolito, NP 12/04/2019  Interventional radiology: Dr. Yamagata 12/01/2019  Gastroenterology: Dr. Mann 11/30/2019  Oncology: Dr. Dorsey 12/01/2019  Procedures:   CT abdomen and pelvis 11/29/2019  CT chest with contrast 12/01/2019  CT abdomen and pelvis 12/05/2019  Abdominal ultrasound 11/30/2019  Ultrasound-guided liver biopsy per Dr. Wagner, IR 12/03/2019  ERCP with stent placement 12/04/2019 per GI, Dr. Hung  Antimicrobials:   None   Subjective: Patient laying in bed.  Daughter at bedside.  Still with some upper abdominal pain.  Having bowel movements.  No nausea or emesis.  Ate 1-2 spoons of applesauce this morning and tolerated it.  Denies chest pain or shortness of breath.  Daughter at bedside and requesting sublingual pain medication.  Objective: Vitals:   12/09/19 2311 12/10/19 0123 12/10/19 0505 12/10/19 0916  BP: 103/62 112/63 102/66 107/63  Pulse: (!) 103 96 90 (!) 105  Resp:  19 15 14  Temp:  97.8 F (36.6 C) 98.6 F (37 C) 99.6 F (37.6 C)  TempSrc:  Oral  Oral  SpO2: 97% 96% 98% 92%  Weight:      Height:        Intake/Output Summary (Last 24 hours) at 12/10/2019 1143 Last data filed at 12/10/2019 0900 Gross per 24 hour  Intake 1020.1 ml  Output 0 ml  Net 1020.1 ml   Filed Weights   12/02/19 2025 12/05/19 2120 12/06/19   2110  Weight: 45.5 kg 45.6 kg 45.6 kg    Examination:  General exam: Frail.  Weak. Respiratory system: CTA B anterior lung fields.  Normal respiratory effort.   Cardiovascular system: Regular rate and rhythm no murmurs rubs or gallops.  No JVD.  1-2+ bilateral lower extremity edema.  Gastrointestinal system: Abdomen is nondistended, soft and tenderness to palpation upper abdomen.  No rebound.  No guarding.  Positive bowel sounds.  Central nervous system: Alert and oriented. No focal neurological deficits. Extremities: 1-2+ bilateral lower extremity edema.   Skin: No rashes, lesions or ulcers Psychiatry: Judgement and insight  appear normal. Mood & affect appropriate.     Data Reviewed: I have personally reviewed following labs and imaging studies  CBC: Recent Labs  Lab 12/05/19 1322 12/06/19 0846 12/07/19 0258 12/08/19 0516 12/09/19 0732  WBC 15.6* 17.2* 15.6* 14.1* 11.9*  NEUTROABS 14.7* 16.9* 14.1* 12.8* 10.8*  HGB 10.8* 10.9* 9.7* 9.4* 10.6*  HCT 33.7* 33.5* 31.0* 29.7* 33.2*  MCV 81.2 79.6* 82.2 80.9 81.2  PLT 317 319 255 266 295   Basic Metabolic Panel: Recent Labs  Lab 12/06/19 0846 12/07/19 0258 12/08/19 0516 12/09/19 0732 12/10/19 0313  NA 137 139 138 139 141  K 4.9 3.8 4.0 4.4 5.0  CL 101 105 105 103 106  CO2 _0 GLUCOSE 117* 106* 106* 113* 111*  BUN 27* 19 25* 26* 27*  CREATININE 0.89 0.78 0.76 0.83 0.91  CALCIUM 7.7* 7.4* 7.5* 7.8* 7.6*  MG  --   --   --   --  2.0   GFR: Estimated Creatinine Clearance: 34.2 mL/min (by C-G formula based on SCr of 0.91 mg/dL). Liver Function Tests: Recent Labs  Lab 12/06/19 0846 12/07/19 0258 12/08/19 0516 12/09/19 0732 12/10/19 0313  AST 172* 117* 174* 216* 245*  ALT 124* 102* 115* 144* 160*  ALKPHOS 436* 394* 507* 663* 669*  BILITOT 10.2* 7.7* 7.9* 7.0* 6.0*  PROT 5.1* 4.6* 4.3* 4.6* 4.2*  ALBUMIN 1.9* 1.5* 1.4* 1.4* 1.2*   Recent Labs  Lab 12/05/19 1322 12/09/19 1000  LIPASE 1,111* 17   No results for input(s): AMMONIA in the last 168 hours. Coagulation Profile: No results for input(s): INR, PROTIME in the last 168 hours. Cardiac Enzymes: No results for input(s): CKTOTAL, CKMB, CKMBINDEX, TROPONINI in the last 168 hours. BNP (last 3 results) No results for input(s): PROBNP in the last 8760 hours. HbA1C: No results for input(s): HGBA1C in the last 72 hours. CBG: Recent Labs  Lab 12/09/19 0726 12/09/19 1109 12/09/19 1606 12/09/19 2030 12/10/19 0729  GLUCAP 96 97 102* 106* 102*   Lipid Profile: No results for input(s): CHOL, HDL, LDLCALC, TRIG, CHOLHDL, LDLDIRECT in the last 72 hours. Thyroid Function  Tests: No results for input(s): TSH, T4TOTAL, FREET4, T3FREE, THYROIDAB in the last 72 hours. Anemia Panel: No results for input(s): VITAMINB12, FOLATE, FERRITIN, TIBC, IRON, RETICCTPCT in the last 72 hours. Sepsis Labs: No results for input(s): PROCALCITON, LATICACIDVEN in the last 168 hours.  No results found for this or any previous visit (from the past 240 hour(s)).       Radiology Studies: No results found.      Scheduled Meds: . lactose free nutrition  237 mL Oral TID WC  . morphine CONCENTRATE  10 mg Oral Q8H   Continuous Infusions:    LOS: 10 days    Time spent: 40 minutes    Irine Seal, MD [facility]   To contact the  attending provider between 7A-7P or the covering provider during after hours 7P-7A, please log into the web site [email or web address] and access using universal [place] password for that web site. If you do not have the password, please call the hospital operator.  12/10/2019, 11:43 AM   

## 2019-12-11 DIAGNOSIS — C787 Secondary malignant neoplasm of liver and intrahepatic bile duct: Principal | ICD-10-CM

## 2019-12-11 DIAGNOSIS — K831 Obstruction of bile duct: Secondary | ICD-10-CM

## 2019-12-11 DIAGNOSIS — Z515 Encounter for palliative care: Secondary | ICD-10-CM

## 2019-12-11 MED ORDER — ONDANSETRON HCL 4 MG/5ML PO SOLN: 4.0000 mg | Freq: Four times a day (QID) | ORAL | 0 refills | Status: AC | PRN

## 2019-12-11 MED ORDER — BOOST PLUS PO LIQD: 237.0000 mL | Freq: Three times a day (TID) | ORAL | 0 refills | Status: AC

## 2019-12-11 MED ORDER — LORAZEPAM 2 MG/ML PO CONC: 0.5000 mg | ORAL | 0 refills | Status: AC | PRN

## 2019-12-11 MED ORDER — MORPHINE SULFATE (CONCENTRATE) 10 MG/0.5ML PO SOLN: 10.0000 mg | Freq: Three times a day (TID) | ORAL | 0 refills | Status: AC

## 2019-12-11 NOTE — Progress Notes (Signed)
Daily Progress Note   Patient Name: Shirley Sanders       Date: 12/11/2019 DOB: 1935/01/25  Age: 85 y.o. MRN#: [id number] Attending Physician: Eugenie Filler, MD Primary Care Physician: Mellody Dance, DO Admit Date: 11/29/2019  Reason for Consultation/Follow-up: Non pain symptom management, Pain control, Psychosocial/spiritual support and Terminal Care  Subjective: Visited with patient and daughter Shirley Sanders at bedside.  Patient awakens easily and denies pain however she continues to shift and moan while grimacing.  Daughter and I agree she is clearly in pain.  RN bringing pain medication.  Shirley Sanders feels much better about her ability to care for her mother in the home.  As long as she has the tools to manage her mother's symptoms at home she feels she can manage it.  We discussed alternative methods of pain control including heating pad, ben gay and repositioning.  We talked about titrating the morphine slightly up if Shirley Sanders is in pain or slightly down if Shirley Sanders is too sleepy.    Patient to DC to home today.  Discussed with Dr. Grandville Silos.  Assessment: Patient appears to be in the dying process.  She is no longer eating or drinking.  She sleeps most of the time.   Home with hospice is very appropriate.   Patient Profile/HPI:  85 yo female presented with biliary obstruction.  She underwent ERCP with stent placement.  She unfortunately developed post ERCP pancreatitis.  Pathology indicated adenocarcinoma with necrosis.      Length of Stay: 11  Current Medications: Scheduled Meds:  . lactose free nutrition  237 mL Oral TID WC  . morphine CONCENTRATE  10 mg Oral Q8H    Continuous Infusions:   PRN Meds: glycopyrrolate **OR** glycopyrrolate **OR** glycopyrrolate, HYDROmorphone  (DILAUDID) injection, LORazepam, morphine CONCENTRATE, ondansetron (ZOFRAN) IV, ondansetron **OR** [DISCONTINUED] ondansetron (ZOFRAN) IV, polyvinyl alcohol  Physical Exam        Elderly female who is in the dying process.  Wakes and quickly falls back to sleep CV rrr resp no distress Abdomen soft, ND Ext 3+ edema.  Vital Signs: BP 124/71 (BP Location: Left Arm)   Pulse 97   Temp (!) 97.4 F (36.3 C) (Oral)   Resp (!) 8   Ht 5' (1.524 m)   Wt 45.6 kg   SpO2 97%  BMI 19.64 kg/m  SpO2: SpO2: 97 % O2 Device: O2 Device: Room Air O2 Flow Rate: O2 Flow Rate (L/min): 6 L/min  Intake/output summary:   Intake/Output Summary (Last 24 hours) at 12/11/2019 1310 Last data filed at 12/11/2019 0900 Gross per 24 hour  Intake 0 ml  Output 0 ml  Net 0 ml   LBM: Last BM Date: 12/10/19 Baseline Weight: Weight: 44.6 kg Most recent weight: Weight: 45.6 kg       Palliative Assessment/Data: 10%      Patient Active Problem List   Diagnosis Date Noted  . Comfort measures only status 12/10/2019  . Palliative care encounter   . Thyroid nodule   . Acute pancreatitis   . Biliary obstruction   . Elevated liver enzymes   . Epigastric pain   . Leukocytosis   . Metastatic adenocarcinoma ([place])   . Palliative care by specialist   . Goals of care, counseling/discussion   . DNR (do not resuscitate)   . Protein-calorie malnutrition, severe 12/02/2019  . Metastatic disease ([place]) 11/29/2019  . Hyperbilirubinemia 11/29/2019  . Jaundice 11/29/2019  . Poor appetite - chronically poor esp since pain assoc with recent accidental fall 11/23/2019  . Injury- h/o Fall 1 mo ago 11/23/2019  . Feared complaint without diagnosis-  ?ably jaundice per family 11/23/2019  . Thoracic back pain 11/16/2019  . Establishing care with new doctor, encounter for 04/08/2019  . Hyperlipidemia associated with type 2 diabetes mellitus ([place]) 04/08/2019  . H/O noncompliance with medical treatment, presenting hazards to  health 04/08/2019  . Diabetes mellitus due to underlying condition with stage 3 chronic kidney disease, without long-term current use of insulin ([place]) 04/08/2019  . Cerebrovascular accident (CVA) ([place]) 04/08/2019  . Macular degeneration (senile) of retina/  legally blind 04/08/2019  . Family history of stroke or transient ischemic attack in mother died age 4; several other fam members also has CVAs 04/08/2019  . Legally blind 04/08/2019  . Compression fracture of thoracic vertebra (HCC) 09/17/2018  . Hypoglycemia due to type 2 diabetes mellitus ([place]) 01/31/2018  . Chronic fatigue 01/31/2018  . Vertigo 01/31/2018  . Osteoporosis 07/08/2015  . Diabetes mellitus with diabetic nephropathy ([place]) 04/06/2015  . History of CVA with residual deficit 10/22/2014  . Chronic kidney disease (CKD) stage G4/A1, severely decreased glomerular filtration rate (GFR) between 15-29 mL/min/1.73 square meter and albuminuria creatinine ratio less than 30 mg/g (HCC) 10/22/2014  . Macular degeneration of both eyes 10/22/2014  . Anemia in chronic kidney disease 10/22/2014  . Hyperlipidemia 03/17/2014  . Hypertension associated with diabetes ([place]) 03/17/2014  . Diabetes mellitus, type 2 ([place]) 03/17/2014    Palliative Care Plan    Recommendations/Plan:  DC to home with Morphine Concentrate SL and Ativan Solution.  Discussed with Dr. Grandville Silos.  Home with Hospice Services.  Goals of Care and Additional Recommendations:  Limitations on Scope of Treatment: Full Comfort Care  Code Status:  DNR  Prognosis:   Days to less than 2 weeks.  Discharge Planning:  Home with Hospice  Care plan was discussed with Dr. Grandville Silos  Thank you for allowing the Palliative Medicine Team to assist in the care of this patient.  Total time spent:  25 min.     Greater than 50%  of this time was spent counseling and coordinating care related to the above assessment and plan.  Florentina Jenny, PA-C Palliative  Medicine  Please contact Palliative MedicineTeam phone at [phone] for questions and concerns between 7 am - 7 pm.  Please see AMION for individual provider pager numbers.

## 2019-12-11 NOTE — Discharge Summary (Signed)
Physician Discharge Summary  Shirley Sanders LSL:[id number] DOB: [birth year] DOA: 11/29/2019  PCP: Mellody Dance, DO  Admit date: 11/29/2019 Discharge date: 12/11/2019  Time spent: 55 minutes  Recommendations for Outpatient Follow-up:  1. Patient to be discharged home with hospice.  Follow-up with hospice MD. 2. Follow-up with Mellody Dance, DO in 2 weeks.   Discharge Diagnoses:  Principal Problem:   Metastatic adenocarcinoma ([place]) Active Problems:   Hypertension associated with diabetes ([place])   Diabetes mellitus, type 2 ([place])   History of CVA with residual deficit   Hyperlipidemia associated with type 2 diabetes mellitus ([place])   Metastatic disease ([place])   Hyperbilirubinemia   Jaundice   Protein-calorie malnutrition, severe   Palliative care by specialist   Goals of care, counseling/discussion   DNR (do not resuscitate)   Biliary obstruction   Elevated liver enzymes   Epigastric pain   Leukocytosis   Acute pancreatitis   Thyroid nodule   Comfort measures only status   Palliative care encounter   Discharge Condition: Stable  Diet recommendation: Regular  Filed Weights   12/02/19 2025 12/05/19 2120 12/06/19 2110  Weight: 45.5 kg 45.6 kg 45.6 kg    History of present illness:  HPI per Dr. Kandis Cocking is a 85 y.o. female with medical history significant for history of CVA, type 2 diabetes, hypertension, and hyperlipidemia who presents to the ED for evaluation of back pain and jaundice.  Patient reports 2-3 weeks of ongoing lower back pain with radiation across her left flank to the upper abdomen/epigastric region.  She reports poor oral intake which she says has been ongoing since her last stroke.  She has been told her skin has appeared yellow.  She had some nausea without emesis which has now resolved.  She reports chronic intermittent loose stools which is unchanged.  She has been feeling lethargic.  She denies any subjective fevers, chills, diaphoresis,  chest pain, dyspnea, cough, or dysuria.  She was initially planned to have outpatient work-up with labs and imaging however family persuaded her to present to the ED today for evaluation.  ED Course:  Initial vitals showed BP 105/70, pulse 108, RR 16, temp 97.7 Fahrenheit, SPO2 100% on room air.  Labs are significant for total bilirubin 13.3 (direct 7.8, indirect 5.5), AST 112, ALT 58, alkaline phosphatase 372, WBC 11.7, hemoglobin 11.1, platelets 412,000, sodium 132, potassium 4.6, bicarb 21, BUN 21, creatinine 1.07, serum glucose 132.  SARS-CoV-2 PCR was obtained and pending.  2 view chest x-ray showed unchanged punctate calcified granuloma left upper lobe.  CT abdomen/pelvis without contrast showed multiple poorly defined hepatic lesions concerning for metastatic disease to the liver, multiple small pulmonary nodules throughout the visualized lung bases, mildly enlarged mesorectal lymph node, nonobstructive calculi in the right renal collecting system, aortic atherosclerosis, moderate sized hiatal hernia.  CT L-spine showed osteopenia, previously augmented T12 compression fracture, widespread degenerative lumbar spinal stenosis, severe at L4-L5.  Patient was given 1 L LR.  EDP discussed the case with on-call oncology who suggested patient may need stent placement and biopsy and will see in the morning.  The hospitalist service was consulted admit for further evaluation and management.  Hospital Course:  1 metastatic liver lesion/hyperbilirubinemia Patient with suspected metastatic disease of unknown primary.  Patient status post liver biopsy 12/03/2019 per IR showing adenocarcinoma with necrosis.  Patient status post ERCP with stent placement in CBD, 12/04/2019 per GI.  Bilirubin trended down.  CT chest which was done for staging showed  numerous subcentimeter bilateral pulmonary nodules.  Dr. Tawanna Solo had a long conversation with patient's daughter and given patient's advanced age,  dementia daughter was not very interested in pursuing further aggressive treatment for her metastatic cancer. Palliative care consulted and decision made to go home with hospice once patient tolerating oral intake with improvement with acute pancreatitis.  Patient started on oral morphine for pain management however daughter requesting sublingual pain medication and as such palliative care changed  oral morphine tablets to sublingual morphine which patient tolerated with better management of patient's pain.  Patient be discharged home on oral morphine.  Patient be discharged home with hospice and will follow up with hospice MD for further pain management.   2.  Acute post ERCP pancreatitis Patient developed severe abdominal pain on 12/05/2019 the day after ERCP with stent placement.  Lipase was elevated at 1111.  CT abdomen and pelvis showed uncomplicated acute pancreatitis.  Patient initially placed on bowel rest with aggressive fluid resuscitation.  Lipase levels trended down and had normalized.  Leukocytosis trended down.  Patient was started on clear liquids diet advance to a full liquid diet and then a regular diet.  Patient however with minimal oral intake per daughter.  Palliative care consulted and assisted with patient's pain management secondary to problem #1.  Patient and daughter decided for patient to be discharged home with hospice.   3.  Diabetes mellitus type 2 Noted to be on Actos prior to admission.  Hemoglobin A1c noted to be 6.9 on 10/23/2019.  Patient initially n.p.o. but as she improved was transitioned to a regular diet.  Patient ate minimal amounts of her food.  Due to problem #1 and #2 sliding scale insulin has been discontinued.  CBGs discontinued.  Patient will be discharged home with hospice.  Patient's oral hypoglycemic agents will be discontinued on discharge.   4.  History of CVA, other nonhemorrhagic Discontinued statin as LFTs elevated.  Aspirin was held for biopsy and  subsequently resumed on discharge..  Patient ambulatory at baseline without a walker or cane with mild left residual hemiparesis.  PT recommended SNF however family have decided to go home with hospice.  5.  Hypertension Patient's antihypertensive medications were held during the hospitalization.  Outpatient follow-up.    6.  Hyperlipidemia Statin discontinued during the hospitalization.  Patient will be discharged home with hospice.   7.  Right thyroid nodule CT scan did show indeterminate right thyroid nodule of 1.8 cm.  TSH within normal limits.  Ultrasound of the thyroid showed hypoechoic 2.9 cm solid right nodule with microcalcification in the right lower lobe.  Daughter does not want to pursue any further evaluation.  Patient will be discharged home with hospice.  8.  Debility/deconditioning Likely secondary to problem #1.  Patient was seen by PT/OT who recommended SNF.  Palliative care assessed patient and decision was made to go home with hospice.  9.  Severe protein calorie malnutrition Secondary to problem #1.  Patient will be discharged home with hospice.     Procedures:  CT abdomen and pelvis 11/29/2019  CT chest with contrast 12/01/2019  CT abdomen and pelvis 12/05/2019  Abdominal ultrasound 11/30/2019  Ultrasound-guided liver biopsy per Dr. Earleen Newport, [place] 12/03/2019  ERCP with stent placement 12/04/2019 per GI, Dr. Benson Norway   Consultations:  Palliative care: Tacey Ruiz, NP 12/04/2019  Interventional radiology: Dr. Kathlene Cote 12/01/2019  Gastroenterology: Dr. Collene Mares 11/30/2019  Oncology: Dr. Lorenso Courier 12/01/2019  Discharge Exam: Vitals:   12/10/19 2104 12/11/19 0849  BP: 127/84  124/71  Pulse: (!) 112 97  Resp: 20 (!) 8  Temp: (!) 97.3 F (36.3 C) (!) 97.4 F (36.3 C)  SpO2: 98% 97%    General: NAD.  Frail.  Cachectic.  Resting comfortably.  Jaundiced. Cardiovascular: Regular rate rhythm no murmurs rubs or gallops. Respiratory: Clear to auscultation  bilaterally anterior lung fields.  Discharge Instructions   Discharge Instructions    Diet general   Complete by: As directed    Increase activity slowly   Complete by: As directed      Allergies as of 12/11/2019      Reactions   Fish Allergy Nausea And Vomiting, Other (See Comments)   SEVERE NAUSEA AND VOMITING THAT LASTS FOR DAYS   Codeine Hypertension   Contrast Media [iodinated Diagnostic Agents] Hypertension   Milk-related Compounds Diarrhea   Shellfish-derived Products Nausea And Vomiting, Other (See Comments)   SEVERE NAUSEA AND VOMITING THAT LASTS FOR DAYS      Medication List    STOP taking these medications   chlorthalidone 25 MG tablet Commonly known as: HYGROTON   Ginkgo Biloba Memory Enhancer 60 MG Caps Generic drug: Ginkgo Biloba Extract   lisinopril 2.5 MG tablet Commonly known as: ZESTRIL   pioglitazone 15 MG tablet Commonly known as: ACTOS   potassium chloride SA 20 MEQ tablet Commonly known as: KLOR-CON   simvastatin 10 MG tablet Commonly known as: ZOCOR   traMADol 50 MG tablet Commonly known as: ULTRAM     TAKE these medications   acetaminophen 500 MG tablet Commonly known as: TYLENOL Take 500-1,000 mg by mouth every 6 (six) hours as needed for mild pain or headache.   aspirin EC 81 MG tablet Take 81 mg by mouth daily.   cholecalciferol 25 MCG (1000 UNIT) tablet Commonly known as: VITAMIN D3 Take 1 tablet (1,000 Units total) by mouth daily.   [employer] Allergy Relief 4 MG tablet Generic drug: chlorpheniramine Take 4 mg by mouth 2 (two) times daily as needed for allergies.   FreeStyle Libre 14 Day Reader Kerrin Mo 1 Device by Does not apply route 3 (three) times daily. Dx: E11.649   FreeStyle Libre 14 Day Sensor Misc 1 Cartridge by Does not apply route every 14 (fourteen) days. Dx: E11.649 What changed:   how much to take  how to take this   lactose free nutrition Liqd Take 237 mLs by mouth 3 (three) times daily with meals.    LORazepam 2 MG/ML concentrated solution Commonly known as: ATIVAN Place 0.3 mLs (0.6 mg total) under the tongue every 4 (four) hours as needed for anxiety or sleep.   morphine CONCENTRATE 10 MG/0.5ML Soln concentrated solution Take 0.5 mLs (10 mg total) by mouth every 8 (eight) hours. Hospice patient.   ondansetron 4 MG/5ML solution Commonly known as: ZOFRAN Take 5 mLs (4 mg total) by mouth every 6 (six) hours as needed for nausea or vomiting.   PreserVision AREDS Caps Take 1 capsule by mouth 2 (two) times daily.      Allergies  Allergen Reactions  . Fish Allergy Nausea And Vomiting and Other (See Comments)    SEVERE NAUSEA AND VOMITING THAT LASTS FOR DAYS  . Codeine Hypertension  . Contrast Media [Iodinated Diagnostic Agents] Hypertension  . Milk-Related Compounds Diarrhea  . Shellfish-Derived Products Nausea And Vomiting and Other (See Comments)    SEVERE NAUSEA AND VOMITING THAT LASTS FOR DAYS   Follow-up Information    Mellody Dance, DO. Schedule an appointment as soon as possible for a  visit in 2 week(s).   Specialty: Family Medicine Contact information: [facility] [address] [phone]        Hospice MD Follow up.            The results of significant diagnostics from this hospitalization (including imaging, microbiology, ancillary and laboratory) are listed below for reference.    Significant Diagnostic Studies: CT ABDOMEN PELVIS WO CONTRAST  Result Date: 12/05/2019 CLINICAL DATA:  Jaundice, biliary dilatation, increasing abdominal pain, history of diffuse liver metastases EXAM: CT ABDOMEN AND PELVIS WITHOUT CONTRAST TECHNIQUE: Multidetector CT imaging of the abdomen and pelvis was performed following the standard protocol without IV contrast. COMPARISON:  11/29/2019 FINDINGS: Lower chest: Subcentimeter pulmonary nodules at the lung bases are unchanged since recent chest CT, or some for metastatic disease. Minimal bilateral lower lobe  atelectasis. Hepatobiliary: Numerous hepatic metastatic lesions are again identified unchanged. Pneumobilia consistent with recent endoscopy and biliary stent placement. High attenuation within the gallbladder either related to recent ERCP or vicarious excretion of previously administered intravenous contrast. No evidence of cholecystitis. Pancreas: Inflammatory changes surround the body and tail the pancreas consistent with acute uncomplicated pancreatitis. No fluid collection, pseudocyst, or abscess. This is new since previous exam. Spleen: Normal in size without focal abnormality. Adrenals/Urinary Tract: Stable nonobstructing 3 mm right renal calculus reference image 23. Otherwise the kidneys are unremarkable. Bladder is decompressed with no gross abnormalities. The adrenals are unremarkable. Stomach/Bowel: There is no bowel obstruction or ileus. Vascular/Lymphatic: Stable atherosclerosis of the aorta. No discrete adenopathy identified on this unenhanced exam. Reproductive: Status post hysterectomy. No adnexal masses. Other: There is a small amount of free fluid within the left upper quadrant, left paracolic gutter, and pelvis. No free intra-abdominal gas. Musculoskeletal: No acute or destructive bony lesions. Stable chronic T12 compression deformity and previous vertebral augmentation. Reconstructed images demonstrate no additional findings. IMPRESSION: 1. Interval development of acute uncomplicated pancreatitis. 2. Interval placement of a common bile duct stent, with pneumobilia confirming stent patency. 3. Diffuse hepatic metastatic disease. Please refer to recent ultrasound-guided biopsy results. 4. Small volume ascites within the left upper quadrant and pelvis. 5. Stable subcentimeter bilateral pulmonary nodules. Please refer to previous chest CT report for full description. 6. Stable 3 mm nonobstructing right renal calculus. Electronically Signed   By: Randa Ngo M.D.   On: 12/05/2019 19:15   CT  ABDOMEN PELVIS WO CONTRAST  Result Date: 11/29/2019 CLINICAL DATA:  85 year old female with history of epigastric pain. EXAM: CT ABDOMEN AND PELVIS WITHOUT CONTRAST TECHNIQUE: Multidetector CT imaging of the abdomen and pelvis was performed following the standard protocol without IV contrast. COMPARISON:  No priors. FINDINGS: Lower chest: Multiple pulmonary nodules noted throughout the visualized lung bases, largest of which is in the right lower lobe (axial image 8 of series 5) measuring 9 x 5 mm. Moderate-sized hiatal hernia. Aortic atherosclerosis. Atherosclerotic calcifications in the right coronary artery. Hepatobiliary: Multiple poorly defined low-attenuation lesions scattered throughout the liver, largest of which is in the right lobe of the liver measuring approximately 8.5 x 3.9 cm (axial image 26 of series 3), concerning for metastatic disease. Gallbladder is moderately distended with intermediate attenuation material within the lumen, suspicious for biliary sludge. No surrounding inflammatory changes. Pancreas: No definite pancreatic mass or peripancreatic fluid collections or inflammatory changes are noted on today's noncontrast CT examination. Spleen: Unremarkable. Adrenals/Urinary Tract: Tiny nonobstructive calculi are noted within the right renal collecting system measuring up to 2 mm in the interpolar region. Unenhanced appearance of  the kidneys and bilateral adrenal glands are otherwise unremarkable. No hydroureteronephrosis. Urinary bladder is normal in appearance. Bilateral adrenal glands are normal in appearance. Stomach/Bowel: Intra-abdominal portion of the stomach is normal in appearance. No pathologic dilatation of small bowel or colon. The appendix is not confidently identified and may be surgically absent. Regardless, there are no inflammatory changes noted adjacent to the cecum to suggest the presence of an acute appendicitis at this time. Vascular/Lymphatic: Aortic atherosclerosis.  Mildly enlarged mesorectal lymph node on the left side (axial image 60 of series 3). No other lymphadenopathy noted elsewhere in the abdomen or pelvis. Reproductive: Status post hysterectomy. Ovaries are not confidently identified may be surgically absent or atrophic. Other: No significant volume of ascites.  No pneumoperitoneum. Musculoskeletal: Chronic compression fracture of T12 with post vertebroplasty changes and 40% loss of anterior vertebral body height. There are no aggressive appearing lytic or blastic lesions noted in the visualized portions of the skeleton. IMPRESSION: 1. Multiple poorly defined hepatic lesions highly concerning for metastatic disease to the liver. These findings could be better evaluated with follow-up nonemergent abdominal MRI with and without IV gadolinium to clearly characterize these hepatic lesions. 2. Multiple small pulmonary nodules throughout the visualized lung bases also concerning for metastatic disease. 3. No definite primary malignancy is confidently identified. However, there is a mildly enlarged mesorectal lymph node measuring 1 cm in short axis. Further evaluation with nonemergent colonoscopy is suggested in the near future to better evaluate for the possibility of primary colorectal neoplasm. 4. Nonobstructive calculi in the right renal collecting system measuring up to 2 mm in the interpolar region. No ureteral stones or findings of urinary tract obstruction. 5. Aortic atherosclerosis, in addition to at least right coronary artery disease. 6. Moderate-sized hiatal hernia. 7. Additional incidental findings, as above. Electronically Signed   By: Vinnie Langton M.D.   On: 11/29/2019 17:53   DG Chest 2 View  Result Date: 11/29/2019 CLINICAL DATA:  Ribcage and chest pain.  No known injury. EXAM: CHEST - 2 VIEW COMPARISON:  PA and lateral chest 03/17/2014. FINDINGS: Punctate calcified granuloma left upper lobe is unchanged. Lungs otherwise clear. Heart size normal. No  pneumothorax or pleural effusion. The patient is status post vertebral augmentation at T12 for a prior fracture. No acute bony abnormality. IMPRESSION: No acute disease. Electronically Signed   By: Inge Rise M.D.   On: 11/29/2019 15:57   CT CHEST W CONTRAST  Result Date: 12/01/2019 CLINICAL DATA:  Diffuse liver metastases with unknown primary EXAM: CT CHEST WITH CONTRAST TECHNIQUE: Multidetector CT imaging of the chest was performed during intravenous contrast administration. CONTRAST:  34mL OMNIPAQUE IOHEXOL 300 MG/ML  SOLN COMPARISON:  11/29/2019 FINDINGS: Cardiovascular: Heart is unremarkable without pericardial effusion. Thoracic aorta is normal in caliber without dissection. Minimal atherosclerosis of the aortic arch and coronary vasculature. Mediastinum/Nodes: No pathologic adenopathy. Heterogeneous 1.8 cm right lobe thyroid nodule with indeterminate calcifications. Small hiatal hernia. Lungs/Pleura: There are numerous subcentimeter pulmonary nodules. Index nodules are as follows: Right lower lobe, image 92, 9 mm. Left lower lobe, image 66, 7 mm. Right upper lobe, image 70, 6 mm. No effusion or pneumothorax.  The central airways are patent. Upper Abdomen: Numerous hypodense masses throughout the liver consistent with metastatic disease. Likely lymphadenopathy at the porta hepatis, incompletely evaluated. Musculoskeletal: There are no acute bony abnormalities. Chronic T12 compression fracture with prior vertebral augmentation. Reconstructed images demonstrate no additional findings. IMPRESSION: 1. Diffuse metastatic disease throughout the liver, with likely adenopathy at the porta  hepatis incompletely evaluated. Please see previous CT abdomen 11/29/2019. The liver lesions are amenable to percutaneous biopsy if tissue diagnosis is desired. 2. Numerous subcentimeter bilateral pulmonary nodules, index lesions provided above. Nodules are too small for percutaneous sampling, and borderline for detection  by PET scan. 3. Indeterminate 1.8 cm right lobe thyroid nodule. Recommend thyroid US. (Ref: J Am Coll Radiol. 2015 Feb;12(2): 143-50). Electronically Signed   By: Randa Ngo M.D.   On: 12/01/2019 23:15   US Abdomen Complete  Result Date: 11/30/2019 CLINICAL DATA:  Epigastric pain and jaundice.  Hyperbilirubinemia. EXAM: ABDOMEN ULTRASOUND COMPLETE COMPARISON:  CT abdomen pelvis 11/29/2019 FINDINGS: Gallbladder: Gallbladder well distended without stones or gallbladder wall thickening. Negative sonographic Murphy sign. Mild gallbladder sludge. Common bile duct: Diameter: 13.3 mm. In addition, there is intrahepatic biliary dilatation. Liver: Multiple hypoechoic ill-defined masses throughout the liver as noted on CT. Largest mass in the left lobe measures 4 x 5 cm. Multiple additional masses in the right lobe. Portal vein is patent on color Doppler imaging with normal direction of blood flow towards the liver. IVC: No abnormality visualized. Pancreas: No mass identified in the pancreas. Spleen: Size and appearance within normal limits. Right Kidney: Length: 8.6 cm. Echogenicity within normal limits. No mass or hydronephrosis visualized. Left Kidney: Length: 7.5 cm. Echogenicity within normal limits. No mass or hydronephrosis visualized. Abdominal aorta: No aneurysm visualized. Other findings: None. IMPRESSION: Negative for gallstones Intrahepatic and extrahepatic biliary dilatation. Common bile duct 13.3 mm. No obstructing mass lesion is seen however neoplasm is likely given the multiple liver lesions compatible with metastatic disease. No definite pancreatic mass on CT. Electronically Signed   By: Franchot Gallo M.D.   On: 11/30/2019 08:09   US BIOPSY (LIVER)  Result Date: 12/03/2019 INDICATION: 85 year old female with a history liver masses and elevated liver enzymes EXAM: ULTRASOUND-GUIDED BIOPSY OF LIVER MASS MEDICATIONS: None. ANESTHESIA/SEDATION: Moderate (conscious) sedation was employed during this  procedure. A total of Versed 0.5 mg and Fentanyl 25 mcg was administered intravenously. Moderate Sedation Time: 10 minutes. The patient's level of consciousness and vital signs were monitored continuously by radiology nursing throughout the procedure under my direct supervision. FLUOROSCOPY TIME:  NONE COMPLICATIONS: NONE PROCEDURE: Informed written consent was obtained from the patient after a thorough discussion of the procedural risks, benefits and alternatives. All questions were addressed. Maximal Sterile Barrier Technique was utilized including caps, mask, sterile gowns, sterile gloves, sterile drape, hand hygiene and skin antiseptic. A timeout was performed prior to the initiation of the procedure. Patient positioned supine position on the ultrasound stretcher. Images were stored sent to PACs. Patient is prepped and draped in the usual sterile fashion. 1% lidocaine was used for local anesthesia. Using ultrasound guidance guide needle was advanced into the right-sided liver mass. Once we confirmed needle tip position multiple 18 gauge core biopsy were achieved. Gel-Foam pledgets were infused. Needle was removed and final image was stored. Tissue specimen placed into formalin.  Sterile bandage was placed. Patient tolerated the procedure well and remained hemodynamically stable throughout. No complications were encountered and no significant blood loss. IMPRESSION: Status post ultrasound-guided biopsy of right liver mass. Tissue specimen sent to pathology for complete histopathologic analysis. Signed, Dulcy Fanny. Dellia Nims, RPVI [facility] Electronically Signed   By: Corrie Mckusick D.O.   On: 12/03/2019 12:59   CT L-SPINE NO CHARGE  Result Date: 11/29/2019 CLINICAL DATA:  85 year old female status post trip and fall about a month ago. Pain radiating  to the back. Jaundice. EXAM: CT LUMBAR SPINE WITHOUT CONTRAST TECHNIQUE: Technique: Multiplanar CT  images of the lumbar spine were reconstructed from contemporary CT of the Abdomen and Pelvis. CONTRAST:  None COMPARISON:  Noncontrast CT Abdomen, and Pelvis today are reported separately. Report of [facility] lumbar spine MRI 09/05/2018 (no images available). FINDINGS: Segmentation: Normal. Alignment: Straightening of lumbar lordosis. No significant spondylolisthesis. Vertebrae: Osteopenia. Previously augmented T12 compression fracture. Bulky lower thoracic and lumbar endplate osteophytosis. Visible sacrum and SI joints appear intact. No acute or suspicious osseous lesion is identified. Paraspinal and other soft tissues: Negative lumbar paraspinal soft tissues. Abdominal and pelvic viscera reported separately today. Disc levels: Degenerative multifactorial spinal stenosis: Moderate at T12-L1, moderate at L2-L3, moderate to severe at L3-L4, severe at L4-L5 (vacuum disc and severe disc space loss at this level). Widespread moderate lumbar neural foraminal stenosis bilateral L3 through L5 nerve levels. IMPRESSION: 1. Osteopenia. No acute osseous abnormality identified in the lumbar spine. Previously augmented T12 compression fracture. 2. See CT Abdomen and Pelvis today reported separately. 3. Widespread degenerative lumbar spinal stenosis, severe at L4-L5. 4. Aortic Atherosclerosis (ICD10-I70.0). Electronically Signed   By: Genevie Ann M.D.   On: 11/29/2019 18:39   DG ERCP BILIARY & PANCREATIC DUCTS  Result Date: 12/04/2019 : High-grade stricture and obstruction of the common bile duct CLINICAL DATA:  Metastatic adenocarcinoma to the liver and biliary obstruction. EXAM: ERCP TECHNIQUE: Multiple spot images obtained with the fluoroscopic device and submitted for interpretation post-procedure. COMPARISON:  CT of the abdomen and pelvis on 11/29/2019 and abdominal ultrasound on 11/30/2019. FINDINGS: Imaging during ERCP demonstrates cannulation of the common bile duct with contrast injection demonstrating high-grade stricture and  obstruction of the distal common bile duct with dilatation of the proximal common bile duct and intrahepatic bile ducts. After crossing the level of obstruction, a self expanding metallic biliary stent was placed. The stent is constricted at the level of biliary obstruction. IMPRESSION: High-grade stricture and obstruction of the distal common bile duct after crossing the level of obstruction which was crossed during the procedure and treated with placement of a self expanding metallic biliary stent. These images were submitted for radiologic interpretation only. Please see the procedural report for the amount of contrast and the fluoroscopy time utilized. Electronically Signed   By: Aletta Edouard M.D.   On: 12/04/2019 14:53   US THYROID  Result Date: 12/03/2019 CLINICAL DATA:  Goiter. EXAM: THYROID ULTRASOUND TECHNIQUE: Ultrasound examination of the thyroid gland and adjacent soft tissues was performed. COMPARISON:  None FINDINGS: Parenchymal Echotexture: Mildly heterogenous Isthmus: Normal in size measures 0.2 cm in diameter Right lobe: Normal in size measuring 4.4 x 1.8 x 1.6 cm Left lobe: Slightly atrophic in size measuring 3.0 x 1.3 x 0.8 cm _________________________________________________________ Estimated total number of nodules >/= 1 cm: 1 Number of spongiform nodules >/=  2 cm not described below (TR1): 0 Number of mixed cystic and solid nodules >/= 1.5 cm not described below ([place]): 0 _________________________________________________________ Nodule # 1: Location: Right; Inferior Maximum size: 2.9 cm; Other 2 dimensions: 2.3 x 1.4 cm Composition: solid/almost completely solid (2) Echogenicity: hypoechoic (2) Shape: not taller-than-wide (0) Margins: lobulated/irregular (2) Echogenic foci: macrocalcifications (1) ACR TI-RADS total points: 7. ACR TI-RADS risk category: TR5 (>/= 7 points). ACR TI-RADS recommendations: **Given size (>/= 1.0 cm) and appearance, fine needle aspiration of this highly  suspicious nodule should be considered based on TI-RADS criteria. _________________________________________________________ There is an approximately 0.6 cm hypoechoic ill-defined nodule/pseudonodule within mid, medial  aspect the left lobe of the thyroid (labeled 2), which does not meet criteria to recommend percutaneous sampling or continued dedicated follow-up. IMPRESSION: Nodule #1 within the mid/inferior aspect of the right lobe of the thyroid meets imaging criteria to recommend percutaneous sampling as clinically indicated. The above is in keeping with the ACR TI-RADS recommendations - J Am Coll Radiol 2017;14:587-595. Electronically Signed   By: Sandi Mariscal M.D.   On: 12/03/2019 13:46    Microbiology: No results found for this or any previous visit (from the past 240 hour(s)).   Labs: Basic Metabolic Panel: Recent Labs  Lab 12/06/19 0846 12/07/19 0258 12/08/19 0516 12/09/19 0732 12/10/19 0313  NA 137 139 138 139 141  K 4.9 3.8 4.0 4.4 5.0  CL 101 105 105 103 106  CO2 25 23 22 24 22   GLUCOSE 117* 106* 106* 113* 111*  BUN 27* 19 25* 26* 27*  CREATININE 0.89 0.78 0.76 0.83 0.91  CALCIUM 7.7* 7.4* 7.5* 7.8* 7.6*  MG  --   --   --   --  2.0   Liver Function Tests: Recent Labs  Lab 12/06/19 0846 12/07/19 0258 12/08/19 0516 12/09/19 0732 12/10/19 0313  AST 172* 117* 174* 216* 245*  ALT 124* 102* 115* 144* 160*  ALKPHOS 436* 394* 507* 663* 669*  BILITOT 10.2* 7.7* 7.9* 7.0* 6.0*  PROT 5.1* 4.6* 4.3* 4.6* 4.2*  ALBUMIN 1.9* 1.5* 1.4* 1.4* 1.2*   Recent Labs  Lab 12/05/19 1322 12/09/19 1000  LIPASE 1,111* 17   No results for input(s): AMMONIA in the last 168 hours. CBC: Recent Labs  Lab 12/05/19 1322 12/06/19 0846 12/07/19 0258 12/08/19 0516 12/09/19 0732  WBC 15.6* 17.2* 15.6* 14.1* 11.9*  NEUTROABS 14.7* 16.9* 14.1* 12.8* 10.8*  HGB 10.8* 10.9* 9.7* 9.4* 10.6*  HCT 33.7* 33.5* 31.0* 29.7* 33.2*  MCV 81.2 79.6* 82.2 80.9 81.2  PLT 317 319 255 266 265    Cardiac Enzymes: No results for input(s): CKTOTAL, CKMB, CKMBINDEX, TROPONINI in the last 168 hours. BNP: BNP (last 3 results) No results for input(s): BNP in the last 8760 hours.  ProBNP (last 3 results) No results for input(s): PROBNP in the last 8760 hours.  CBG: Recent Labs  Lab 12/09/19 0726 12/09/19 1109 12/09/19 1606 12/09/19 2030 12/10/19 0729  GLUCAP 96 97 102* 106* 102*       Signed:  Irine Seal MD.  [facility] 12/11/2019, 12:39 PM

## 2019-12-11 NOTE — Progress Notes (Signed)
DISCHARGE NOTE HOME AMARIZ FLAMENCO to be discharged to home per MD order with home hospice palliative. Discussed prescriptions. Prescriptions given to patient' daughter medication list explained in detail. Patient's daughter verbalized understanding.  Skin clean, dry and intact without evidence of skin break down, no evidence of skin tears noted. IV catheter discontinued intact. Site without signs and symptoms of complications. Dressing and pressure applied. Pt denies pain at the site currently. No complaints noted.  Patient free of lines, drains, and wounds.   An After Visit Summary (AVS) was printed and given to the patient. Patient escorted via wheelchair, and discharged home via private auto.  [place], Zenon Mayo, RN

## 2019-12-11 NOTE — Plan of Care (Signed)
  Problem: Skin Integrity: Goal: Risk for impaired skin integrity will decrease Outcome: Adequate for Discharge   

## 2019-12-11 NOTE — Progress Notes (Signed)
Send text page to Dr Grandville Silos about the patient's problem on her mother 26 morphone from [place].

## 2019-12-11 NOTE — Care Management (Signed)
Patient and daughter Shirley Sanders ready for discharge. Spoke to [employer] at bedside, she confirmed she wants to take her mother home with [facility].  Requesting [facility] transport home. Address: [address] [place], [place]. Called [facility] given estimated time of pick up 3 pm. Avon Gully and Dorian Pod with [facility] all aware.   [facility] paperwork on chart.   Magdalen Spatz RN

## 2019-12-16 ENCOUNTER — Encounter: Payer: Self-pay | Admitting: Family Medicine

## 2020-01-05 ENCOUNTER — Telehealth: Payer: Self-pay | Admitting: *Deleted

## 2020-01-05 NOTE — Telephone Encounter (Signed)
Patients daughter called requesting copy of pathology report from her biopsy from 12/03/2019.  She states her mother passed away before they could get results and she wanted a copy of the results in case the results would impact other family members.  Her mychart is inactive due to death.  Routed to primary nurse and Dr. Lorenso Courier to advise clearance for release.  Please mail copy to Daughter Shirley Sanders [address] [place] [address]

## 2020-01-07 DEATH — deceased

## 2021-05-03 IMAGING — CT CT ABD-PELV W/O CM
2 of 4 series · 14 of 46 positions shown, 16 images · non-contrast
Comparison: No priors.

CLINICAL DATA: 84-year-old female with history of epigastric pain.

EXAM:
CT ABDOMEN AND PELVIS WITHOUT CONTRAST
TECHNIQUE: Multidetector CT imaging of the abdomen and pelvis was performed
following the standard protocol without IV contrast.

[Series 3: ap without · axial · non-contrast · 0.58mm/px · z∈[-433,-78]mm · 11 of 81 slices shown, 13 images]
[im 5/81  soft-tissue]
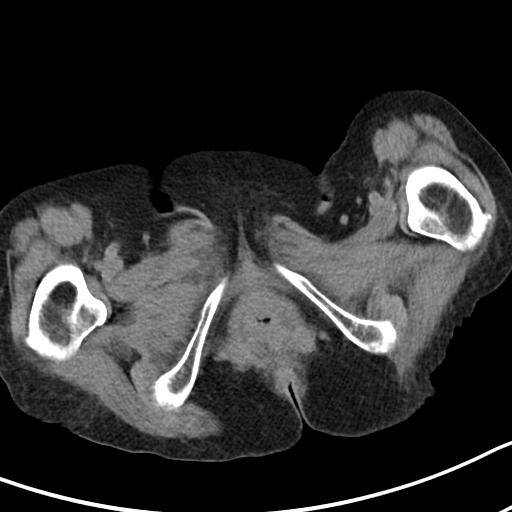
[im 5/81  bone]
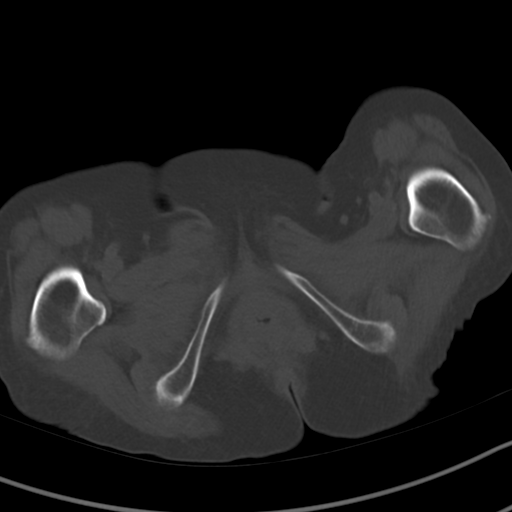
[im 14/81  soft-tissue]
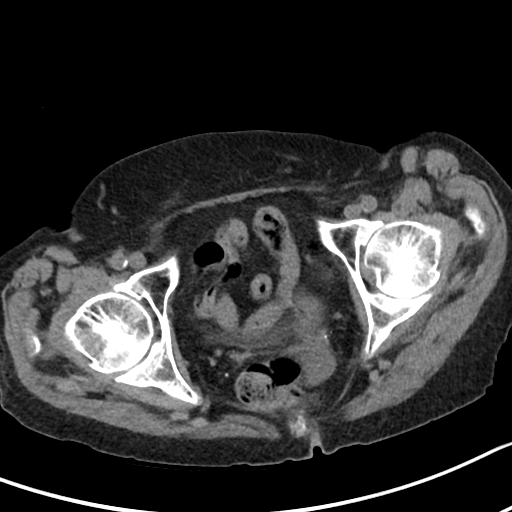
[im 18/81  soft-tissue]
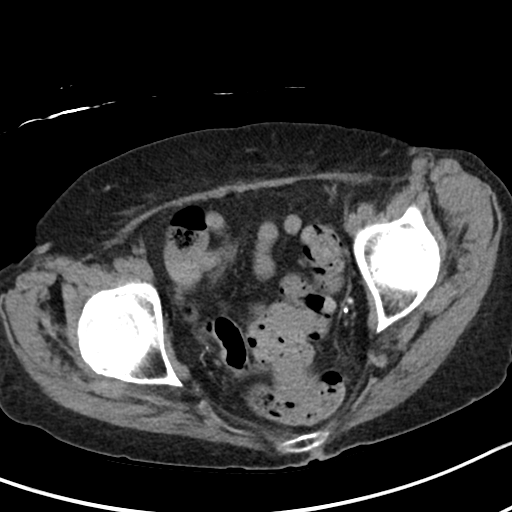
[im 27/81  soft-tissue]
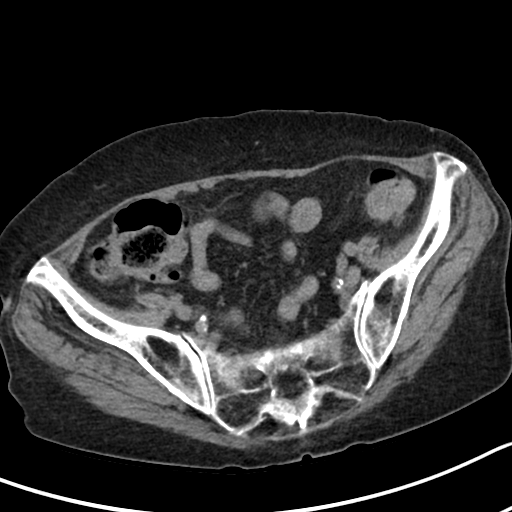
[im 32/81  soft-tissue]
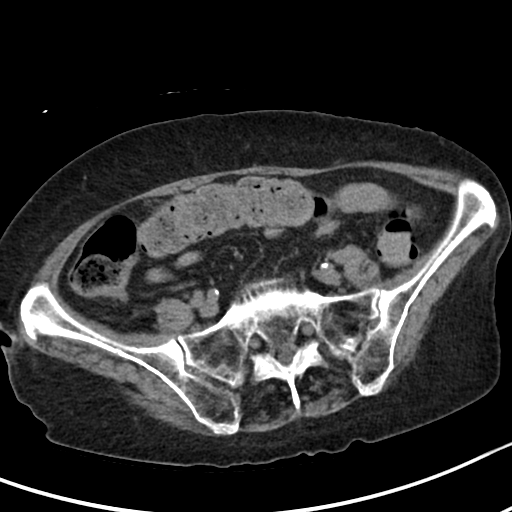
[im 41/81  soft-tissue]
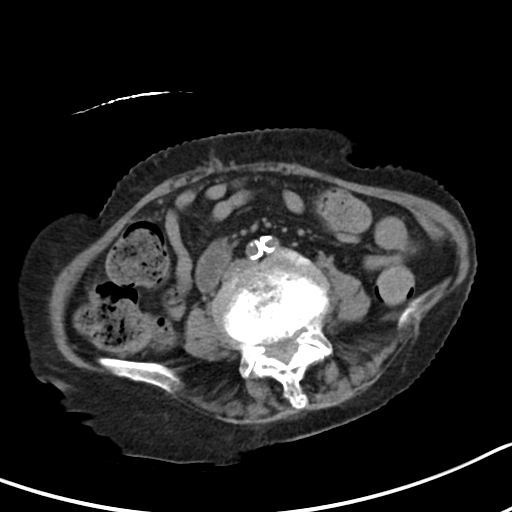
[im 49/81  soft-tissue]
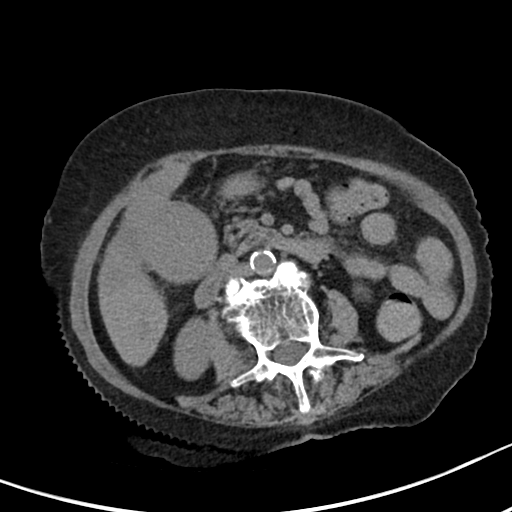
[im 54/81  soft-tissue]
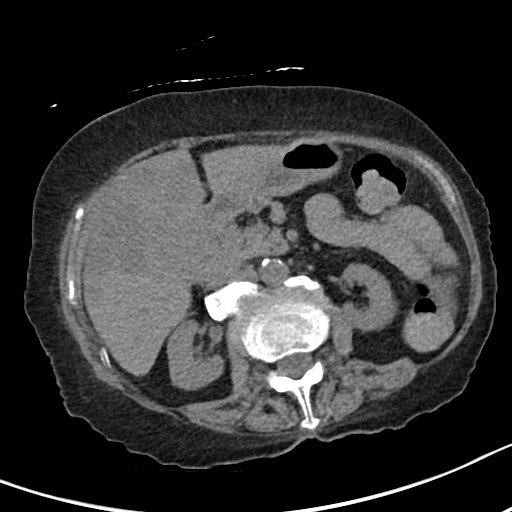
[im 63/81  soft-tissue]
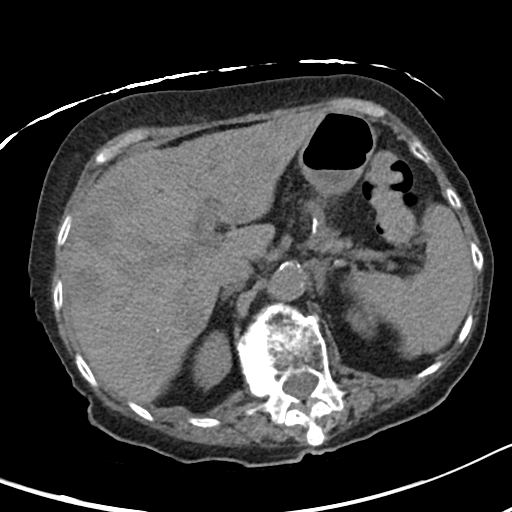
[im 63/81  bone]
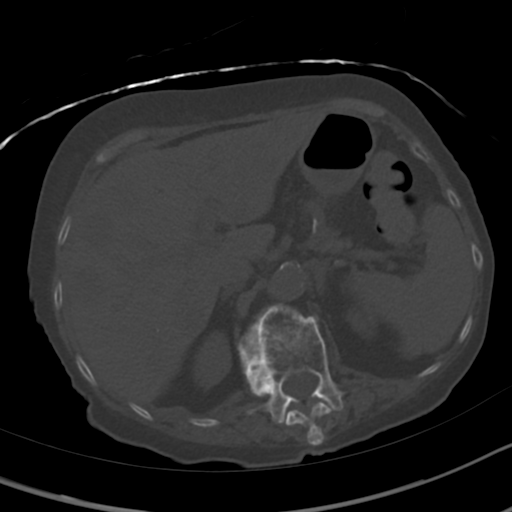
[im 67/81  soft-tissue]
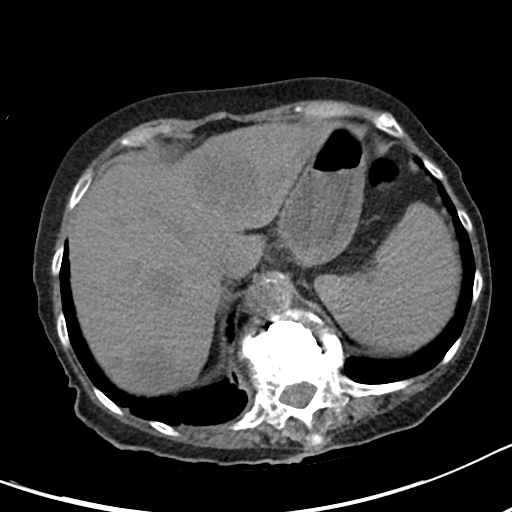
[im 76/81  soft-tissue]
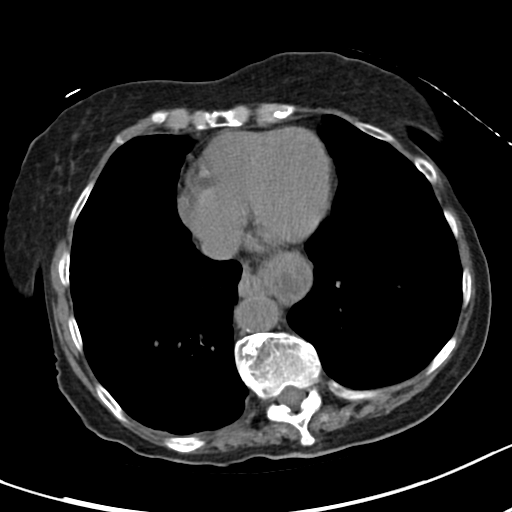

[Series 6: cor · coronal · 0.62mm/px · 3 of 83 slices shown]
[im 28/83  soft-tissue]
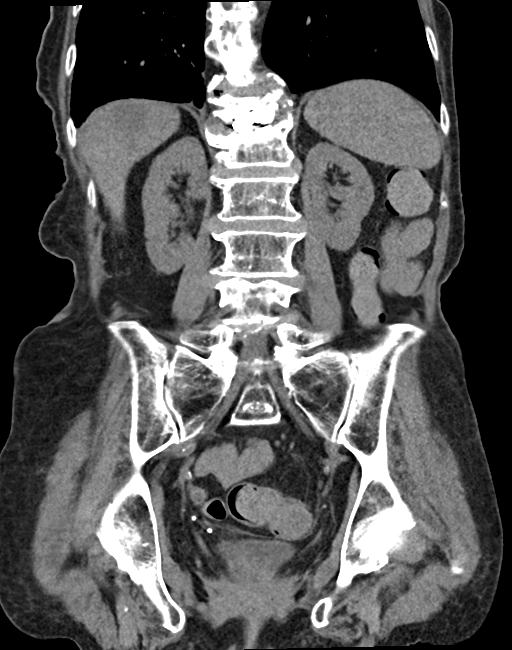
[im 37/83  soft-tissue]
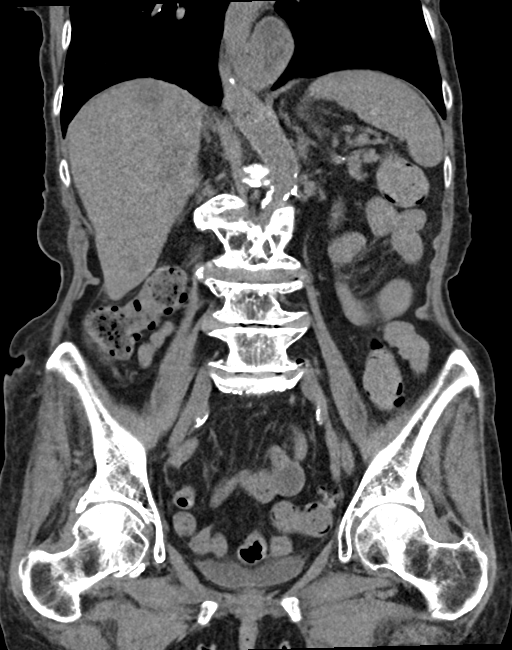
[im 46/83  soft-tissue]
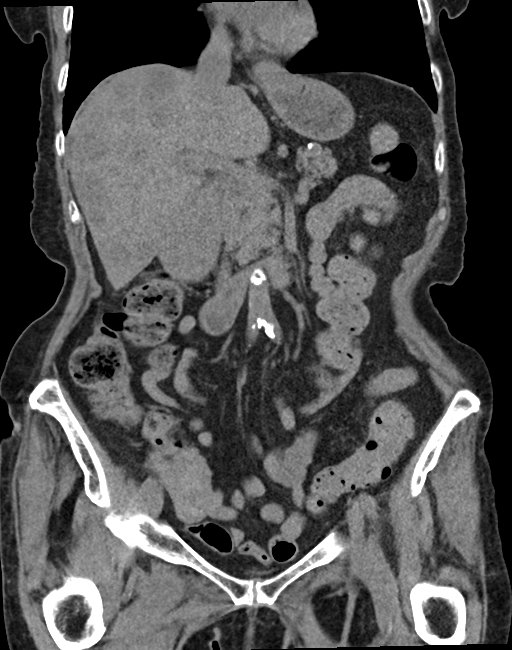

[14 of 46 positions shown; findings below may reference images not displayed]

FINDINGS: Lower chest: Multiple pulmonary nodules noted throughout the
visualized lung bases, largest of which is in the right lower lobe
(axial image 8 of series 5) measuring 9 x 5 mm. Moderate-sized
hiatal hernia. Aortic atherosclerosis. Atherosclerotic
calcifications in the right coronary artery.

Hepatobiliary: Multiple poorly defined low-attenuation lesions
scattered throughout the liver, largest of which is in the right
lobe of the liver measuring approximately 8.5 x 3.9 cm (axial image
26 of series 3), concerning for metastatic disease. Gallbladder is
moderately distended with intermediate attenuation material within
the lumen, suspicious for biliary sludge. No surrounding
inflammatory changes.

Pancreas: No definite pancreatic mass or peripancreatic fluid
collections or inflammatory changes are noted on today's noncontrast
CT examination.

Spleen: Unremarkable.

Adrenals/Urinary Tract: Tiny nonobstructive calculi are noted within
the right renal collecting system measuring up to 2 mm in the
interpolar region. Unenhanced appearance of the kidneys and
bilateral adrenal glands are otherwise unremarkable. No
hydroureteronephrosis. Urinary bladder is normal in appearance.
Bilateral adrenal glands are normal in appearance.

Stomach/Bowel: Intra-abdominal portion of the stomach is normal in
appearance. No pathologic dilatation of small bowel or colon. The
appendix is not confidently identified and may be surgically absent.
Regardless, there are no inflammatory changes noted adjacent to the
cecum to suggest the presence of an acute appendicitis at this time.

Vascular/Lymphatic: Aortic atherosclerosis. Mildly enlarged
mesorectal lymph node on the left side (axial image 60 of series 3).
No other lymphadenopathy noted elsewhere in the abdomen or pelvis.

Reproductive: Status post hysterectomy. Ovaries are not confidently
identified may be surgically absent or atrophic.

Other: No significant volume of ascites.  No pneumoperitoneum.

Musculoskeletal: Chronic compression fracture of T12 with post
vertebroplasty changes and 40% loss of anterior vertebral body
height. There are no aggressive appearing lytic or blastic lesions
noted in the visualized portions of the skeleton.
IMPRESSION: 1. Multiple poorly defined hepatic lesions highly concerning for
metastatic disease to the liver. These findings could be better
evaluated with follow-up nonemergent abdominal MRI with and without
IV gadolinium to clearly characterize these hepatic lesions.
2. Multiple small pulmonary nodules throughout the visualized lung
bases also concerning for metastatic disease.
3. No definite primary malignancy is confidently identified.
However, there is a mildly enlarged mesorectal lymph node measuring
1 cm in short axis. Further evaluation with nonemergent colonoscopy
is suggested in the near future to better evaluate for the
possibility of primary colorectal neoplasm.
4. Nonobstructive calculi in the right renal collecting system
measuring up to 2 mm in the interpolar region. No ureteral stones or
findings of urinary tract obstruction.
5. Aortic atherosclerosis, in addition to at least right coronary
artery disease.
6. Moderate-sized hiatal hernia.
7. Additional incidental findings, as above.
